# Patient Record
Sex: Male | Born: 1955 | Race: White | Hispanic: No | Smoking: Current some day smoker
Health system: Southern US, Community
[De-identification: ages and names within clinical notes are randomized; demographics above are authoritative.]

## PROBLEM LIST (undated history)

## (undated) DIAGNOSIS — I483 Typical atrial flutter: Secondary | ICD-10-CM

## (undated) DIAGNOSIS — I10 Essential (primary) hypertension: Secondary | ICD-10-CM

## (undated) DIAGNOSIS — E7800 Pure hypercholesterolemia, unspecified: Secondary | ICD-10-CM

## (undated) HISTORY — PX: LAPAROTOMY: SHX154

## (undated) NOTE — *Deleted (*Deleted)
Physician Discharge Summary  Loys Shugars WJX:[id number] DOB: Dec 18, 1955 DOA: 09/19/2020  PCP: Roger Kill, PA-C  Admit date: 09/19/2020 Discharge date: 09/23/2020  Admitted From: *** Disposition: ***  Recommendations for Outpatient Follow-up:  1. Follow up with PCP in 1 week*** 2. Please obtain BMP/CBC in one week*** 3. Please follow up on the following pending results: ***  Home Health: *** Equipment/Devices: ***  Discharge Condition: *** CODE STATUS: *** Diet recommendation: ***   Brief/Interim Summary:  Admission HPI written by Narda Bonds, MD   [facility] course:  ***  Discharge Diagnoses:  Principal Problem:   Atrial flutter with rapid ventricular response (HCC) Active Problems:   Primary hypertension   Typical atrial flutter (HCC)   Acute pulmonary embolism (HCC)   Dyslipidemia   Class 1 obesity   Acute on chronic diastolic CHF (congestive heart failure) (HCC)   Gout   Pleural effusion on left   Unspecified atrial fibrillation (HCC)   Community acquired pneumonia   Dyspnea   Long term current use of antiarrhythmic drug    Discharge Instructions   Allergies as of 09/23/2020   No Known Allergies   Med Rec must be completed prior to using this SMARTLINK***       No Known Allergies  Consultations:  ***   Procedures/Studies: DG Chest 1 View  Result Date: 09/22/2020 CLINICAL DATA:  Left-sided thoracentesis today EXAM: CHEST  1 VIEW COMPARISON:  09/20/2020 FINDINGS: Marked reduction in size of the left pleural effusion. No significant pneumothorax. Mild atelectasis and potentially a small amount of residual effusion at the left lung base. Thoracic spondylosis. Borderline enlargement of the cardiopericardial silhouette. IMPRESSION: 1. Marked reduction in size of the left pleural effusion status post thoracentesis. No pneumothorax. 2. Borderline enlargement of the cardiopericardial silhouette. Electronically Signed    By: Gaylyn Rong M.D.   On: 09/22/2020 14:30   CT Angio Chest PE W and/or Wo Contrast  Result Date: 08/27/2020 CLINICAL DATA:  Shortness of breath and chest pain EXAM: CT ANGIOGRAPHY CHEST WITH CONTRAST TECHNIQUE: Multidetector CT imaging of the chest was performed using the standard protocol during bolus administration of intravenous contrast. Multiplanar CT image reconstructions and MIPs were obtained to evaluate the vascular anatomy. CONTRAST:  88mL OMNIPAQUE IOHEXOL 350 MG/ML SOLN COMPARISON:  None. FINDINGS: Cardiovascular: Satisfactory opacification of the pulmonary arteries to the segmental level. There are filling defects in segmental branches of bilateral upper and right middle lobes. Additional suspected filling defects within subsegmental branches. Left atrial enlargement. No evidence of right heart strain. No pericardial effusion. Mediastinum/Nodes: There are no enlarged lymph nodes identified. Thyroid and esophagus are unremarkable. Lungs/Pleura: Patchy areas of atelectasis. No pleural effusion or pneumothorax. Upper Abdomen: No acute abnormality. Musculoskeletal: No acute osseous abnormality. Review of the MIP images confirms the above findings. IMPRESSION: Acute segmental and subsegmental pulmonary emboli bilaterally. No evidence of right heart strain. Patchy bilateral atelectasis. These results were called by telephone at the time of interpretation on 08/27/2020 at 8:52 pm to provider [facility] , who verbally acknowledged these results. Electronically Signed   By: Guadlupe Spanish M.D.   On: 08/27/2020 20:59   DG Chest Port 1 View  Result Date: 09/20/2020 CLINICAL DATA:  Left pleural effusion status post thoracentesis EXAM: PORTABLE CHEST 1 VIEW COMPARISON:  09/19/2020 FINDINGS: Stable cardiomegaly. Slight interval decrease in left-sided pleural effusion, now moderate. Hazy left basilar opacity. Right lung remains clear. No pneumothorax. IMPRESSION: Interval decrease in left-sided  pleural effusion,  now moderate. No pneumothorax. Electronically Signed   By: Duanne Guess D.O.   On: 09/20/2020 08:29   DG Chest Port 1 View  Result Date: 09/19/2020 CLINICAL DATA:  Sepsis EXAM: PORTABLE CHEST 1 VIEW COMPARISON:  08/27/2020 FINDINGS: Cardiac silhouette appears enlarged, although is partially obscured by a moderate to large left-sided pleural effusion. Associated hazy left basilar opacity. Right lung is clear. No pneumothorax. IMPRESSION: 1. Moderate to large left-sided pleural effusion, new from prior. Associated hazy left basilar opacity may reflect atelectasis or pneumonia. 2. Cardiomegaly. An underlying pericardial effusion would be difficult to exclude. Electronically Signed   By: Duanne Guess D.O.   On: 09/19/2020 12:18   DG Chest Port 1 View  Result Date: 08/27/2020 CLINICAL DATA:  Chest pain. EXAM: PORTABLE CHEST 1 VIEW COMPARISON:  September 23, 2017. FINDINGS: The heart size and mediastinal contours are within normal limits. No pneumothorax or pleural effusion is noted. Hypoinflation of the lungs is noted with mild bibasilar subsegmental atelectasis. The visualized skeletal structures are unremarkable. IMPRESSION: Hypoinflation of the lungs with mild bibasilar subsegmental atelectasis. Electronically Signed   By: Lupita Raider M.D.   On: 08/27/2020 14:42   ECHOCARDIOGRAM COMPLETE  Result Date: 08/28/2020    ECHOCARDIOGRAM REPORT   Patient Name:   MD SMOLA Date of Exam: 08/28/2020 Medical Rec #:  [id number]     Height:       70.0 in Accession #:    [id number]    Weight:       225.0 lb Date of Birth:  1956/01/09     BSA:          2.194 m Patient Age:    64 years      BP:           134/89 mmHg Patient Gender: M             HR:           95 bpm. Exam Location:  Inpatient Procedure: 2D Echo, Cardiac Doppler and Color Doppler Indications:     Pulmonary embolus, Atrial flutter  History:         Patient has no prior history of Echocardiogram examinations.                   Arrythmias:Atrial Flutter, Signs/Symptoms:Pulmonary embolus,                  Shortness of Breath and Chest Pain; Risk Factors:Hypertension                  and Dyslipidemia.  Sonographer:     Lavenia Atlas RDCS Referring Phys:  [id number] Lavone Neri OPYD Diagnosing Phys: Yates Decamp MD IMPRESSIONS  1. Left ventricular ejection fraction, by estimation, is 50 to 55%. The left ventricle has low normal function. The left ventricle has no regional wall motion abnormalities. Left ventricular diastolic parameters were normal.  2. Right ventricular systolic function is normal. The right ventricular size is normal. There is moderately elevated pulmonary artery systolic pressure. The estimated right ventricular systolic pressure is 40.6 mmHg.  3. The mitral valve is normal in structure. Trivial mitral valve regurgitation. No evidence of mitral stenosis.  4. The aortic valve is normal in structure. Aortic valve regurgitation is not visualized.  5. The inferior vena cava is dilated in size with <50% respiratory variability, suggesting right atrial pressure of 15 mmHg. FINDINGS  Left Ventricle: Left ventricular ejection fraction, by estimation, is 50 to 55%. The left ventricle has  low normal function. The left ventricle has no regional wall motion abnormalities. The left ventricular internal cavity size was normal in size. There is no left ventricular hypertrophy. Left ventricular diastolic parameters were normal. Right Ventricle: The right ventricular size is normal. No increase in right ventricular wall thickness. Right ventricular systolic function is normal. There is moderately elevated pulmonary artery systolic pressure. The tricuspid regurgitant velocity is 2.53 m/s, and with an assumed right atrial pressure of 15 mmHg, the estimated right ventricular systolic pressure is 40.6 mmHg. Left Atrium: Left atrial size was normal in size. Right Atrium: Right atrial size was normal in size. Pericardium: There is no evidence of  pericardial effusion. Mitral Valve: The mitral valve is normal in structure. Trivial mitral valve regurgitation. No evidence of mitral valve stenosis. Tricuspid Valve: The tricuspid valve is normal in structure. Tricuspid valve regurgitation is mild . No evidence of tricuspid stenosis. Aortic Valve: The aortic valve is normal in structure. Aortic valve regurgitation is not visualized. Pulmonic Valve: The pulmonic valve was grossly normal. Pulmonic valve regurgitation is not visualized. Aorta: The aortic root is normal in size and structure. Pulmonary Artery: The pulmonary artery is of normal size. Venous: The inferior vena cava is dilated in size with less than 50% respiratory variability, suggesting right atrial pressure of 15 mmHg. IAS/Shunts: No atrial level shunt detected by color flow Doppler.  LEFT VENTRICLE PLAX 2D LVIDd:         6.30 cm  Diastology LVIDs:         4.40 cm  LV e' medial:    6.64 cm/s LV PW:         1.10 cm  LV E/e' medial:  15.2 LV IVS:        1.20 cm  LV e' lateral:   9.90 cm/s LVOT diam:     2.20 cm  LV E/e' lateral: 10.2 LV SV:         68 LV SV Index:   31 LVOT Area:     3.80 cm  RIGHT VENTRICLE RV Basal diam:  2.90 cm RV S prime:     12.80 cm/s TAPSE (M-mode): 3.2 cm RVSP:           40.6 mmHg LEFT ATRIUM             Index       RIGHT ATRIUM            Index LA diam:        4.60 cm 2.10 cm/m  RA Pressure: 15.00 mmHg LA Vol (A2C):   37.8 ml 17.23 ml/m RA Area:     15.10 cm LA Vol (A4C):   41.6 ml 18.96 ml/m RA Volume:   37.20 ml   16.95 ml/m LA Biplane Vol: 40.1 ml 18.27 ml/m  AORTIC VALVE LVOT Vmax:   83.50 cm/s LVOT Vmean:  62.000 cm/s LVOT VTI:    0.179 m  AORTA Ao Root diam: 3.20 cm MITRAL VALVE                TRICUSPID VALVE MV Area (PHT): 7.44 cm     TR Peak grad:   25.6 mmHg MV Decel Time: 102 msec     TR Vmax:        253.00 cm/s MV E velocity: 101.00 cm/s  Estimated RAP:  15.00 mmHg MV A velocity: 51.80 cm/s   RVSP:           40.6 mmHg MV E/A ratio:  1.95  SHUNTS                             Systemic VTI:  0.18 m                             Systemic Diam: 2.20 cm Yates Decamp MD Electronically signed by Yates Decamp MD Signature Date/Time: 08/28/2020/10:19:06 AM    Final    VAS [place] LOWER EXTREMITY VENOUS (DVT)  Result Date: 08/28/2020  Lower Venous DVTStudy Indications: Pulmonary embolism.  Comparison Study: no prior Performing Technologist: Blanch Media RVS  Examination Guidelines: A complete evaluation includes B-mode imaging, spectral Doppler, color Doppler, and power Doppler as needed of all accessible portions of each vessel. Bilateral testing is considered an integral part of a complete examination. Limited examinations for reoccurring indications may be performed as noted. The reflux portion of the exam is performed with the patient in reverse Trendelenburg.  +---------+---------------+---------+-----------+----------+--------------+ RIGHT    CompressibilityPhasicitySpontaneityPropertiesThrombus Aging +---------+---------------+---------+-----------+----------+--------------+ CFV      Full           Yes      Yes                                 +---------+---------------+---------+-----------+----------+--------------+ SFJ      Full                                                        +---------+---------------+---------+-----------+----------+--------------+ FV Prox  Full                                                        +---------+---------------+---------+-----------+----------+--------------+ FV Mid   Full                                                        +---------+---------------+---------+-----------+----------+--------------+ FV DistalFull                                                        +---------+---------------+---------+-----------+----------+--------------+ PFV      Full                                                         +---------+---------------+---------+-----------+----------+--------------+ POP      Full           Yes      Yes                                 +---------+---------------+---------+-----------+----------+--------------+  PTV      Full                                                        +---------+---------------+---------+-----------+----------+--------------+ PERO     Full                                                        +---------+---------------+---------+-----------+----------+--------------+   +---------+---------------+---------+-----------+----------+--------------+ LEFT     CompressibilityPhasicitySpontaneityPropertiesThrombus Aging +---------+---------------+---------+-----------+----------+--------------+ CFV      Full           Yes      Yes                                 +---------+---------------+---------+-----------+----------+--------------+ SFJ      Full                                                        +---------+---------------+---------+-----------+----------+--------------+ FV Prox  Full                                                        +---------+---------------+---------+-----------+----------+--------------+ FV Mid   Full                                                        +---------+---------------+---------+-----------+----------+--------------+ FV DistalFull                                                        +---------+---------------+---------+-----------+----------+--------------+ PFV      Full                                                        +---------+---------------+---------+-----------+----------+--------------+ POP      Full           Yes      Yes                                 +---------+---------------+---------+-----------+----------+--------------+ PTV      Full                                                         +---------+---------------+---------+-----------+----------+--------------+  PERO     Full                                                        +---------+---------------+---------+-----------+----------+--------------+     Summary: BILATERAL: - No evidence of deep vein thrombosis seen in the lower extremities, bilaterally. - No evidence of superficial venous thrombosis in the lower extremities, bilaterally. -   *See table(s) above for measurements and observations. Electronically signed by Gretta Began MD on 08/28/2020 at 4:27:49 PM.    Final    ECHOCARDIOGRAM LIMITED  Addendum Date: 09/21/2020   Read by Dr. Marca Ancona Mclean Electronically Amended 09/21/2020, 1:57 PM   Final [facility])    Result Date: 09/21/2020    ECHOCARDIOGRAM LIMITED REPORT   Patient Name:   HARTWELL VANDIVER Date of Exam: 09/21/2020 Medical Rec #:  [id number]     Height:       70.0 in Accession #:    [id number]    Weight:       232.3 lb Date of Birth:  October 26, 1956     BSA:          2.224 m Patient Age:    64 years      BP:           107/76 mmHg Patient Gender: M             HR:           84 bpm. Exam Location:  Inpatient Procedure: 2D Echo Indications:     atrial flutter. pulmonary embolism.  History:         Patient has prior history of Echocardiogram examinations, most                  recent 08/28/2020. CHF, Arrythmias:Atrial Fibrillation and                  Atrial Flutter; Risk Factors:Dyslipidemia.  Sonographer:     Delcie Roch Referring Phys:  [id number] HYQMV HQION Diagnosing Phys: Orpah Cobb MD IMPRESSIONS  1. Left ventricular ejection fraction, by estimation, is 50%. The left ventricle has mildly decreased function. The left ventricle demonstrates global hypokinesis. Left ventricular diastolic parameters are indeterminate.  2. Right ventricular systolic function is normal. The right ventricular size is normal. The estimated right ventricular systolic pressure is 36.7 mmHg.  3. Left atrial size was mildly dilated.  4.  The mitral valve is normal in structure. Trivial mitral valve regurgitation. No evidence of mitral stenosis.  5. The aortic valve is tricuspid. Aortic valve regurgitation is not visualized. Mild aortic valve sclerosis is present, with no evidence of aortic valve stenosis.  6. The inferior vena cava is dilated in size with <50% respiratory variability, suggesting right atrial pressure of 15 mmHg. FINDINGS  Left Ventricle: Left ventricular ejection fraction, by estimation, is 50%. The left ventricle has mildly decreased function. The left ventricle demonstrates global hypokinesis. The left ventricular internal cavity size was normal in size. There is no left ventricular hypertrophy. Left ventricular diastolic parameters are indeterminate. Right Ventricle: The right ventricular size is normal. No increase in right ventricular wall thickness. Right ventricular systolic function is normal. The tricuspid regurgitant velocity is 2.33 m/s, and with an assumed right atrial pressure of 15 mmHg, the estimated right ventricular systolic pressure is 36.7 mmHg. Left Atrium: Left atrial  size was mildly dilated. Right Atrium: Right atrial size was normal in size. Pericardium: Trivial pericardial effusion is present. Mitral Valve: The mitral valve is normal in structure. Trivial mitral valve regurgitation. No evidence of mitral valve stenosis. Tricuspid Valve: The tricuspid valve is normal in structure. Tricuspid valve regurgitation is trivial. Aortic Valve: The aortic valve is tricuspid. Aortic valve regurgitation is not visualized. Mild aortic valve sclerosis is present, with no evidence of aortic valve stenosis. Aorta: The aortic root is normal in size and structure. Venous: The inferior vena cava is dilated in size with less than 50% respiratory variability, suggesting right atrial pressure of 15 mmHg. IAS/Shunts: No atrial level shunt detected by color flow Doppler. LEFT VENTRICLE PLAX 2D LVIDd:         5.50 cm Diastology  LVIDs:         3.80 cm LV e' medial:  9.46 cm/s LV PW:         1.10 cm LV e' lateral: 10.60 cm/s LV IVS:        0.90 cm  IVC IVC diam: 2.70 cm LEFT ATRIUM         Index LA diam:    4.60 cm 2.07 cm/m  TRICUSPID VALVE TR Peak grad:   21.7 mmHg TR Vmax:        233.00 cm/s Orpah Cobb MD Electronically signed by Marca Ancona MD Signature Date/Time: 09/21/2020/2:08:50 PM  Read by Dr. Marca Ancona Mclean Electronically Amended 09/21/2020, 1:57 PM   Final    IR THORACENTESIS ASP PLEURAL SPACE W/IMG GUIDE  Result Date: 09/20/2020 INDICATION: Patient with history of HF, atrial fibrillation/flutter, dyspnea, and left pleural effusion. Request is made for diagnostic and therapeutic left thoracentesis. EXAM: ULTRASOUND GUIDED DIAGNOSTIC AND THERAPEUTIC LEFT THORACENTESIS MEDICATIONS: 10 mL 1% lidocaine COMPLICATIONS: None immediate. PROCEDURE: An ultrasound guided thoracentesis was thoroughly discussed with the patient and questions answered. The benefits, risks, alternatives and complications were also discussed. The patient understands and wishes to proceed with the procedure. Written consent was obtained. Ultrasound was performed to localize and mark an adequate pocket of fluid in the left chest. The area was then prepped and draped in the normal sterile fashion. 1% Lidocaine was used for local anesthesia. Under ultrasound guidance a 6 Fr Safe-T-Centesis catheter was introduced. Thoracentesis was performed. The catheter was removed and a dressing applied. FINDINGS: A total of approximately 1.5 L of hazy amber fluid was removed. Procedure was stopped after 1.5 L secondary to patient's hypotension (73/63, 92/74) - catheter was removed and patient was laid back in bed with BP improvement (106/87). Patient remained asymptomatic throughout procedure (denied dizziness, vision changes). Samples were sent to the laboratory as requested by the clinical team. IMPRESSION: Successful ultrasound guided left thoracentesis  yielding 1.5 L of pleural fluid. Read by: Elwin Mocha, PA-C Electronically Signed   By: Marliss Coots MD   On: 09/20/2020 08:35     ***   Subjective: ***  Discharge Exam: Vitals:   09/23/20 0101 09/23/20 0403  BP: 104/72 100/63  Pulse: 72 86  Resp: 18 20  Temp: 98.3 F (36.8 C) 99.8 F (37.7 C)  SpO2: 95% 94%   Vitals:   09/22/20 2047 09/22/20 2213 09/23/20 0101 09/23/20 0403  BP: 104/64 113/66 104/72 100/63  Pulse: 78 80 72 86  Resp: 20  18 20   Temp: 98.6 F (37 C)  98.3 F (36.8 C) 99.8 F (37.7 C)  TempSrc: Oral  Oral Oral  SpO2: 92%  95% 94%  Weight:  98.9 kg  Height:        General exam: Appears calm and comfortable Respiratory system: Clear to auscultation on right with slightly diminished breath sounds on left. Respiratory effort normal. Cardiovascular system: S1 & S2 heard, RRR. No murmurs, rubs, gallops or clicks. Gastrointestinal system: Abdomen is protuberant, soft and nontender. No organomegaly or masses felt. Normal bowel sounds heard. Central nervous system: Alert and oriented. No focal neurological deficits. Musculoskeletal: Minimal edema. No calf tenderness Skin: No cyanosis. No rashes Psychiatry: Judgement and insight appear normal. Mood & affect appropriate.     The results of significant diagnostics from this hospitalization (including imaging, microbiology, ancillary and laboratory) are listed below for reference.     Microbiology: Recent Results (from the past 240 hour(s))  Blood culture (routine single)     Status: None (Preliminary result)   Collection Time: 09/19/20 11:33 AM   Specimen: BLOOD  Result Value Ref Range Status   Specimen Description BLOOD BLOOD RIGHT HAND  Final   Special Requests   Final    BOTTLES DRAWN AEROBIC AND ANAEROBIC Blood Culture adequate volume   Culture   Final    NO GROWTH 3 DAYS Performed at [facility], [address], [place], [place] [address]    Report Status PENDING  Incomplete  Urine  culture     Status: Abnormal   Collection Time: 09/19/20 11:33 AM   Specimen: In/Out Cath Urine  Result Value Ref Range Status   Specimen Description IN/OUT CATH URINE  Final   Special Requests   Final    NONE Performed at [facility], [address], [place], [place] [address]    Culture 4,000 COLONIES/mL ESCHERICHIA COLI (A)  Final   Report Status 09/21/2020 FINAL  Final   Organism ID, Bacteria ESCHERICHIA COLI (A)  Final      Susceptibility   Escherichia coli - MIC*    AMPICILLIN 8 SENSITIVE Sensitive     CEFAZOLIN <=4 SENSITIVE Sensitive     CEFEPIME <=0.12 SENSITIVE Sensitive     CEFTRIAXONE <=0.25 SENSITIVE Sensitive     CIPROFLOXACIN <=0.25 SENSITIVE Sensitive     GENTAMICIN <=1 SENSITIVE Sensitive     IMIPENEM <=0.25 SENSITIVE Sensitive     NITROFURANTOIN <=16 SENSITIVE Sensitive     TRIMETH/SULFA <=20 SENSITIVE Sensitive     AMPICILLIN/SULBACTAM <=2 SENSITIVE Sensitive     PIP/TAZO <=4 SENSITIVE Sensitive     * 4,000 COLONIES/mL ESCHERICHIA COLI  Culture, blood (single)     Status: None (Preliminary result)   Collection Time: 09/19/20 12:15 PM   Specimen: BLOOD LEFT FOREARM  Result Value Ref Range Status   Specimen Description BLOOD LEFT FOREARM  Final   Special Requests   Final    BOTTLES DRAWN AEROBIC AND ANAEROBIC Blood Culture adequate volume   Culture   Final    NO GROWTH 3 DAYS Performed at [facility], [address] [place], [place] [address]    Report Status PENDING  Incomplete  Respiratory Panel by RT PCR (Flu A&B, Covid) - Nasopharyngeal Swab     Status: None   Collection Time: 09/19/20  1:11 PM   Specimen: Nasopharyngeal Swab  Result Value Ref Range Status   SARS Coronavirus 2 by RT PCR NEGATIVE NEGATIVE Final    Comment: (NOTE) SARS-CoV-2 target nucleic acids are NOT DETECTED.  The SARS-CoV-2 RNA is generally detectable in upper respiratoy specimens during the acute phase of infection. The lowest concentration of SARS-CoV-2 viral  copies  this assay can detect is 131 copies/mL. A negative result does not preclude SARS-Cov-2 infection and should not be used as the sole basis for treatment or other patient management decisions. A negative result may occur with  improper specimen collection/handling, submission of specimen other than nasopharyngeal swab, presence of viral mutation(s) within the areas targeted by this assay, and inadequate number of viral copies (<131 copies/mL). A negative result must be combined with clinical observations, patient history, and epidemiological information. The expected result is Negative.  Fact Sheet for Patients:  https://www.moore.com/  Fact Sheet for Healthcare Providers:  https://www.young.biz/  This test is no t yet approved or cleared by the [place] FDA and  has been authorized for detection and/or diagnosis of SARS-CoV-2 by FDA under an Emergency Use Authorization (EUA). This EUA will remain  in effect (meaning this test can be used) for the duration of the COVID-19 declaration under Section 564(b)(1) of the Act, 21 U.S.C. section 360bbb-3(b)(1), unless the authorization is terminated or revoked sooner.     Influenza A by PCR NEGATIVE NEGATIVE Final   Influenza B by PCR NEGATIVE NEGATIVE Final    Comment: (NOTE) The Xpert Xpress SARS-CoV-2/FLU/RSV assay is intended as an aid in  the diagnosis of influenza from Nasopharyngeal swab specimens and  should not be used as a sole basis for treatment. Nasal washings and  aspirates are unacceptable for Xpert Xpress SARS-CoV-2/FLU/RSV  testing.  Fact Sheet for Patients: https://www.moore.com/  Fact Sheet for Healthcare Providers: https://www.young.biz/  This test is not yet approved or cleared by the [place] FDA and  has been authorized for detection and/or diagnosis of SARS-CoV-2 by  FDA under an Emergency Use Authorization (EUA). This  EUA will remain  in effect (meaning this test can be used) for the duration of the  Covid-19 declaration under Section 564(b)(1) of the Act, 21  U.S.C. section 360bbb-3(b)(1), unless the authorization is  terminated or revoked. Performed at [facility], [address] [place], [place] [address]   Culture, body fluid-bottle     Status: None (Preliminary result)   Collection Time: 09/20/20  8:35 AM   Specimen: Pleura  Result Value Ref Range Status   Specimen Description PLEURAL FLUID  Final   Special Requests LEFT LUNG  Final   Culture   Final    NO GROWTH 2 DAYS Performed at [facility], [address] [place], [place] [address]    Report Status PENDING  Incomplete  Gram stain     Status: None   Collection Time: 09/20/20  8:35 AM   Specimen: Pleura  Result Value Ref Range Status   Specimen Description PLEURAL FLUID  Final   Special Requests LEFT LUNG  Final   Gram Stain   Final    WBC PRESENT,BOTH PMN AND MONONUCLEAR NO ORGANISMS SEEN CYTOSPIN SMEAR Performed at [facility], [address] [place], [place] [address]    Report Status 09/20/2020 FINAL  Final  Expectorated sputum assessment w rflx to resp cult     Status: None   Collection Time: 09/22/20  3:44 PM   Specimen: Expectorated Sputum  Result Value Ref Range Status   Specimen Description EXPECTORATED SPUTUM  Final   Special Requests NONE  Final   Sputum evaluation   Final    THIS SPECIMEN IS ACCEPTABLE FOR SPUTUM CULTURE Performed at [facility], [address] [place], [place] [address]    Report Status 09/22/2020 FINAL  Final  Culture,  respiratory     Status: None (Preliminary result)   Collection Time: 09/22/20  3:44 PM  Result Value Ref Range Status   Specimen Description EXPECTORATED SPUTUM  Final   Special Requests NONE Reflexed from [id number]  Final   Gram Stain   Final    RARE WBC PRESENT, PREDOMINANTLY PMN RARE GRAM POSITIVE COCCI IN PAIRS RARE GRAM NEGATIVE RODS  Performed at [facility], [address] [place], [place] [address]    Culture PENDING  Incomplete   Report Status PENDING  Incomplete     Labs: BNP (last 3 results) Recent Labs    09/19/20 1135 09/21/20 0441  BNP 149.7* 86.4   Basic Metabolic Panel: Recent Labs  Lab 09/19/20 1133 09/20/20 0500 09/21/20 0441 09/22/20 0420 09/23/20 0138  NA 139 138 138 134* 137  K 4.2 4.0 3.5 3.1* 2.9*  CL 104 101 97* 87* 88*  CO2 25 28 32 36* 37*  GLUCOSE 112* 112* 90 157* 88  BUN 10 13 14 10 10   CREATININE 0.82 0.93 0.88 0.88 0.98  CALCIUM 8.6* 8.8* 8.9 9.1 9.5  MG  --  2.1 2.1 1.9  --    Liver Function Tests: Recent Labs  Lab 09/19/20 1133  AST 15  ALT 27  ALKPHOS 92  BILITOT 0.7  PROT 6.0*  ALBUMIN 2.3*   No results for input(s): LIPASE, AMYLASE in the last 168 hours. No results for input(s): AMMONIA in the last 168 hours. CBC: Recent Labs  Lab 09/19/20 1133 09/20/20 0400 09/23/20 0138  WBC 17.0* 12.6* 10.7*  NEUTROABS 15.0*  --   --   HGB 12.4* 12.3* 12.6*  HCT 39.8 39.5 38.3*  MCV 102.6* 104.8* 96.5  PLT 366 333 369   Cardiac Enzymes: No results for input(s): CKTOTAL, CKMB, CKMBINDEX, TROPONINI in the last 168 hours. BNP: Invalid input(s): POCBNP CBG: No results for input(s): GLUCAP in the last 168 hours. D-Dimer No results for input(s): DDIMER in the last 72 hours. Hgb A1c No results for input(s): HGBA1C in the last 72 hours. Lipid Profile No results for input(s): CHOL, HDL, LDLCALC, TRIG, CHOLHDL, LDLDIRECT in the last 72 hours. Thyroid function studies Recent Labs    09/21/20 0441  TSH 2.577   Anemia work up No results for input(s): VITAMINB12, FOLATE, FERRITIN, TIBC, IRON, RETICCTPCT in the last 72 hours. Urinalysis    Component Value Date/Time   COLORURINE YELLOW 09/19/2020 1133   APPEARANCEUR CLEAR 09/19/2020 1133   LABSPEC 1.017 09/19/2020 1133   PHURINE 5.0 09/19/2020 1133   GLUCOSEU NEGATIVE 09/19/2020 1133   HGBUR SMALL (A)  09/19/2020 1133   BILIRUBINUR NEGATIVE 09/19/2020 1133   KETONESUR NEGATIVE 09/19/2020 1133   PROTEINUR NEGATIVE 09/19/2020 1133   NITRITE NEGATIVE 09/19/2020 1133   LEUKOCYTESUR NEGATIVE 09/19/2020 1133   Sepsis Labs Invalid input(s): PROCALCITONIN,  WBC,  LACTICIDVEN Microbiology Recent Results (from the past 240 hour(s))  Blood culture (routine single)     Status: None (Preliminary result)   Collection Time: 09/19/20 11:33 AM   Specimen: BLOOD  Result Value Ref Range Status   Specimen Description BLOOD BLOOD RIGHT HAND  Final   Special Requests   Final    BOTTLES DRAWN AEROBIC AND ANAEROBIC Blood Culture adequate volume   Culture   Final    NO GROWTH 3 DAYS Performed at [facility], [address], [place], [place] [address]    Report Status PENDING  Incomplete  Urine culture     Status: Abnormal  Collection Time: 09/19/20 11:33 AM   Specimen: In/Out Cath Urine  Result Value Ref Range Status   Specimen Description IN/OUT CATH URINE  Final   Special Requests   Final    NONE Performed at [facility], [address], [place], [place] [address]    Culture 4,000 COLONIES/mL ESCHERICHIA COLI (A)  Final   Report Status 09/21/2020 FINAL  Final   Organism ID, Bacteria ESCHERICHIA COLI (A)  Final      Susceptibility   Escherichia coli - MIC*    AMPICILLIN 8 SENSITIVE Sensitive     CEFAZOLIN <=4 SENSITIVE Sensitive     CEFEPIME <=0.12 SENSITIVE Sensitive     CEFTRIAXONE <=0.25 SENSITIVE Sensitive     CIPROFLOXACIN <=0.25 SENSITIVE Sensitive     GENTAMICIN <=1 SENSITIVE Sensitive     IMIPENEM <=0.25 SENSITIVE Sensitive     NITROFURANTOIN <=16 SENSITIVE Sensitive     TRIMETH/SULFA <=20 SENSITIVE Sensitive     AMPICILLIN/SULBACTAM <=2 SENSITIVE Sensitive     PIP/TAZO <=4 SENSITIVE Sensitive     * 4,000 COLONIES/mL ESCHERICHIA COLI  Culture, blood (single)     Status: None (Preliminary result)   Collection Time: 09/19/20 12:15 PM   Specimen: BLOOD LEFT FOREARM   Result Value Ref Range Status   Specimen Description BLOOD LEFT FOREARM  Final   Special Requests   Final    BOTTLES DRAWN AEROBIC AND ANAEROBIC Blood Culture adequate volume   Culture   Final    NO GROWTH 3 DAYS Performed at [facility], [address] [place], [place] [address]    Report Status PENDING  Incomplete  Respiratory Panel by RT PCR (Flu A&B, Covid) - Nasopharyngeal Swab     Status: None   Collection Time: 09/19/20  1:11 PM   Specimen: Nasopharyngeal Swab  Result Value Ref Range Status   SARS Coronavirus 2 by RT PCR NEGATIVE NEGATIVE Final    Comment: (NOTE) SARS-CoV-2 target nucleic acids are NOT DETECTED.  The SARS-CoV-2 RNA is generally detectable in upper respiratoy specimens during the acute phase of infection. The lowest concentration of SARS-CoV-2 viral copies this assay can detect is 131 copies/mL. A negative result does not preclude SARS-Cov-2 infection and should not be used as the sole basis for treatment or other patient management decisions. A negative result may occur with  improper specimen collection/handling, submission of specimen other than nasopharyngeal swab, presence of viral mutation(s) within the areas targeted by this assay, and inadequate number of viral copies (<131 copies/mL). A negative result must be combined with clinical observations, patient history, and epidemiological information. The expected result is Negative.  Fact Sheet for Patients:  https://www.moore.com/  Fact Sheet for Healthcare Providers:  https://www.young.biz/  This test is no t yet approved or cleared by the [place] FDA and  has been authorized for detection and/or diagnosis of SARS-CoV-2 by FDA under an Emergency Use Authorization (EUA). This EUA will remain  in effect (meaning this test can be used) for the duration of the COVID-19 declaration under Section 564(b)(1) of the Act, 21 U.S.C. section 360bbb-3(b)(1),  unless the authorization is terminated or revoked sooner.     Influenza A by PCR NEGATIVE NEGATIVE Final   Influenza B by PCR NEGATIVE NEGATIVE Final    Comment: (NOTE) The Xpert Xpress SARS-CoV-2/FLU/RSV assay is intended as an aid in  the diagnosis of influenza from Nasopharyngeal swab specimens and  should not be used as a sole basis for treatment. Nasal washings and  aspirates are unacceptable for Xpert Xpress SARS-CoV-2/FLU/RSV  testing.  Fact Sheet for Patients: https://www.moore.com/  Fact Sheet for Healthcare Providers: https://www.young.biz/  This test is not yet approved or cleared by the [place] FDA and  has been authorized for detection and/or diagnosis of SARS-CoV-2 by  FDA under an Emergency Use Authorization (EUA). This EUA will remain  in effect (meaning this test can be used) for the duration of the  Covid-19 declaration under Section 564(b)(1) of the Act, 21  U.S.C. section 360bbb-3(b)(1), unless the authorization is  terminated or revoked. Performed at [facility], [address] [place], [place] [address]   Culture, body fluid-bottle     Status: None (Preliminary result)   Collection Time: 09/20/20  8:35 AM   Specimen: Pleura  Result Value Ref Range Status   Specimen Description PLEURAL FLUID  Final   Special Requests LEFT LUNG  Final   Culture   Final    NO GROWTH 2 DAYS Performed at [facility], [address] [place], [place] [address]    Report Status PENDING  Incomplete  Gram stain     Status: None   Collection Time: 09/20/20  8:35 AM   Specimen: Pleura  Result Value Ref Range Status   Specimen Description PLEURAL FLUID  Final   Special Requests LEFT LUNG  Final   Gram Stain   Final    WBC PRESENT,BOTH PMN AND MONONUCLEAR NO ORGANISMS SEEN CYTOSPIN SMEAR Performed at [facility], [address], [place], [place] [address]    Report Status 09/20/2020 FINAL  Final   Expectorated sputum assessment w rflx to resp cult     Status: None   Collection Time: 09/22/20  3:44 PM   Specimen: Expectorated Sputum  Result Value Ref Range Status   Specimen Description EXPECTORATED SPUTUM  Final   Special Requests NONE  Final   Sputum evaluation   Final    THIS SPECIMEN IS ACCEPTABLE FOR SPUTUM CULTURE Performed at [facility], [address] [place], [place] [address]    Report Status 09/22/2020 FINAL  Final  Culture, respiratory     Status: None (Preliminary result)   Collection Time: 09/22/20  3:44 PM  Result Value Ref Range Status   Specimen Description EXPECTORATED SPUTUM  Final   Special Requests NONE Reflexed from [phone]  Final   Gram Stain   Final    RARE WBC PRESENT, PREDOMINANTLY PMN RARE GRAM POSITIVE COCCI IN PAIRS RARE GRAM NEGATIVE RODS Performed at [facility], [address] [place], [place] [address]    Culture PENDING  Incomplete   Report Status PENDING  Incomplete     Time coordinating discharge: Over 30 minutes***  SIGNED:   Jacquelin Hawking, MD [facility] 09/23/2020, 8:48 AM

## (undated) NOTE — *Deleted (*Deleted)
Subjective:  ***  Intake/Output from previous day:  I/O last 3 completed shifts: In: 1117.7 [I.V.:967.7; IV Piggyback:150] Out: 700 [Urine:700] Total I/O In: 240 [P.O.:240] Out: -   Blood pressure 133/83, pulse 76, temperature 98.6 F (37 C), temperature source Oral, resp. rate 20, height 5\' 10"  (1.778 m), weight 102.1 kg, SpO2 96 %. Physical Exam  Lab Results: BMP BNP (last 3 results) No results for input(s): BNP in the last 8760 hours.  ProBNP (last 3 results) No results for input(s): PROBNP in the last 8760 hours. BMP Latest Ref Rng & Units 08/29/2020 08/28/2020 08/27/2020  Glucose 70 - 99 mg/dL 161(W) 960(A) 540(J)  BUN 8 - 23 mg/dL 13 16 17   Creatinine 0.61 - 1.24 mg/dL 8.11 9.14 7.82  Sodium 135 - 145 mmol/L 136 135 141  Potassium 3.5 - 5.1 mmol/L 3.9 4.2 4.9  Chloride 98 - 111 mmol/L 105 104 108  CO2 22 - 32 mmol/L 22 21(L) 18(L)  Calcium 8.9 - 10.3 mg/dL 9.5(A) 2.1(H) 0.8(M)   No flowsheet data found. CBC Latest Ref Rng & Units 08/29/2020 08/28/2020 08/27/2020  WBC 4.0 - 10.5 K/uL 8.6 12.7(H) 16.7(H)  Hemoglobin 13.0 - 17.0 g/dL 12.0(L) 13.3 14.9  Hematocrit 39 - 52 % 37.1(L) 40.9 45.6  Platelets 150 - 400 K/uL 142(L) 138(L) 162   Lipid Panel  No results found for: CHOL, TRIG, HDL, CHOLHDL, VLDL, LDLCALC, LDLDIRECT Cardiac Panel (last 3 results) No results for input(s): CKTOTAL, CKMB, TROPONINI, RELINDX in the last 72 hours.  HEMOGLOBIN A1C No results found for: HGBA1C, MPG TSH Recent Labs    08/27/20 1430  TSH 0.457   Imaging: {imaging:[id number]}  Cardiac Studies:  EKG: {ekg findings:315101::"normal EKG, normal sinus rhythm","unchanged from previous tracings"}.  Recent Results (from the past 57846 hour(s))  ECHOCARDIOGRAM COMPLETE   Collection Time: 08/28/20  9:59 AM  Result Value   Weight 3,600   Height 70   BP 128/77   S' Lateral 4.40   Area-P 1/2 7.44   Narrative      ECHOCARDIOGRAM REPORT       Patient Name:   ADEN SEK Date of  Exam: 08/28/2020 Medical Rec #:  [id number]     Height:       70.0 in Accession #:    [id number]    Weight:       225.0 lb Date of Birth:  [birth year]     BSA:          2.194 m Patient Age:    64 years      BP:           134/89 mmHg Patient Gender: M             HR:           95 bpm. Exam Location:  Inpatient  Procedure: 2D Echo, Cardiac Doppler and Color Doppler  Indications:     Pulmonary embolus, Atrial flutter   History:         Patient has no prior history of Echocardiogram examinations.                  Arrythmias:Atrial Flutter, Signs/Symptoms:Pulmonary embolus,                  Shortness of Breath and Chest Pain; Risk Factors:Hypertension                  and Dyslipidemia.   Sonographer:     Lavenia Atlas RDCS Referring Phys:  [phone] TIMOTHY  S OPYD Diagnosing Phys: Yates Decamp MD  IMPRESSIONS    1. Left ventricular ejection fraction, by estimation, is 50 to 55%. The left ventricle has low normal function. The left ventricle has no regional wall motion abnormalities. Left ventricular diastolic parameters were normal.  2. Right ventricular systolic function is normal. The right ventricular size is normal. There is moderately elevated pulmonary artery systolic pressure. The estimated right ventricular systolic pressure is 40.6 mmHg.  3. The mitral valve is normal in structure. Trivial mitral valve regurgitation. No evidence of mitral stenosis.  4. The aortic valve is normal in structure. Aortic valve regurgitation is not visualized.  5. The inferior vena cava is dilated in size with <50% respiratory variability, suggesting right atrial pressure of 15 mmHg.  FINDINGS  Left Ventricle: Left ventricular ejection fraction, by estimation, is 50 to 55%. The left ventricle has low normal function. The left ventricle has no regional wall motion abnormalities. The left ventricular internal cavity size was normal in size.  There is no left ventricular hypertrophy. Left ventricular  diastolic parameters were normal.  Right Ventricle: The right ventricular size is normal. No increase in right ventricular wall thickness. Right ventricular systolic function is normal. There is moderately elevated pulmonary artery systolic pressure. The tricuspid regurgitant velocity is  2.53 m/s, and with an assumed right atrial pressure of 15 mmHg, the estimated right ventricular systolic pressure is 40.6 mmHg.  Left Atrium: Left atrial size was normal in size.  Right Atrium: Right atrial size was normal in size.  Pericardium: There is no evidence of pericardial effusion.  Mitral Valve: The mitral valve is normal in structure. Trivial mitral valve regurgitation. No evidence of mitral valve stenosis.  Tricuspid Valve: The tricuspid valve is normal in structure. Tricuspid valve regurgitation is mild . No evidence of tricuspid stenosis.  Aortic Valve: The aortic valve is normal in structure. Aortic valve regurgitation is not visualized.  Pulmonic Valve: The pulmonic valve was grossly normal. Pulmonic valve regurgitation is not visualized.  Aorta: The aortic root is normal in size and structure.  Pulmonary Artery: The pulmonary artery is of normal size.  Venous: The inferior vena cava is dilated in size with less than 50% respiratory variability, suggesting right atrial pressure of 15 mmHg.  IAS/Shunts: No atrial level shunt detected by color flow Doppler.    LEFT VENTRICLE PLAX 2D LVIDd:         6.30 cm  Diastology LVIDs:         4.40 cm  LV e' medial:    6.64 cm/s LV PW:         1.10 cm  LV E/e' medial:  15.2 LV IVS:        1.20 cm  LV e' lateral:   9.90 cm/s LVOT diam:     2.20 cm  LV E/e' lateral: 10.2 LV SV:         68 LV SV Index:   31 LVOT Area:     3.80 cm    RIGHT VENTRICLE RV Basal diam:  2.90 cm RV S prime:     12.80 cm/s TAPSE (M-mode): 3.2 cm RVSP:           40.6 mmHg  LEFT ATRIUM             Index       RIGHT ATRIUM            Index LA diam:        4.60 cm  2.10 cm/m  RA Pressure: 15.00 mmHg LA Vol (A2C):   37.8 ml 17.23 ml/m RA Area:     15.10 cm LA Vol (A4C):   41.6 ml 18.96 ml/m RA Volume:   37.20 ml   16.95 ml/m LA Biplane Vol: 40.1 ml 18.27 ml/m  AORTIC VALVE LVOT Vmax:   83.50 cm/s LVOT Vmean:  62.000 cm/s LVOT VTI:    0.179 m   AORTA Ao Root diam: 3.20 cm  MITRAL VALVE                TRICUSPID VALVE MV Area (PHT): 7.44 cm     TR Peak grad:   25.6 mmHg MV Decel Time: 102 msec     TR Vmax:        253.00 cm/s MV E velocity: 101.00 cm/s  Estimated RAP:  15.00 mmHg MV A velocity: 51.80 cm/s   RVSP:           40.6 mmHg MV E/A ratio:  1.95                             SHUNTS                             Systemic VTI:  0.18 m                             Systemic Diam: 2.20 cm  Yates Decamp MD Electronically signed by Yates Decamp MD Signature Date/Time: 08/28/2020/10:19:06 AM       Final     *Note: Due to a large number of results and/or encounters for the requested time period, some results have not been displayed. A complete set of results can be found in Results Review.    Scheduled Meds: . apixaban  10 mg Oral BID   Followed by  . [START ON 09/04/2020] apixaban  5 mg Oral BID  . atorvastatin  20 mg Oral Daily  . diltiazem  300 mg Oral Daily  . dronedarone  400 mg Oral BID WC  . metoprolol tartrate  25 mg Oral BID  . sodium chloride flush  3 mL Intravenous Q12H   Continuous Infusions: PRN Meds:.acetaminophen **OR** acetaminophen, HYDROcodone-acetaminophen, levalbuterol, morphine injection, ondansetron **OR** ondansetron (ZOFRAN) IV, polyethylene glycol  Assessment/Plan:  ***    Yates Decamp, MD, [facility] 08/29/2020, 1:04 PM Office: [phone] Pager: [phone]

## (undated) NOTE — *Deleted (*Deleted)
ANTICOAGULATION CONSULT NOTE - Initial Consult  Pharmacy Consult for heparin gtt Indication: pulmonary embolus  No Known Allergies  Patient Measurements: Height: 5\' 10"  (177.8 cm) Weight: 102.1 kg (225 lb) IBW/kg (Calculated) : 73 Heparin Dosing Weight: 94.5kg  Vital Signs: Temp: 98.8 F (37.1 C) (10/18 1426) Temp Source: Oral (10/18 1426) BP: 130/92 (10/18 2015) Pulse Rate: 101 (10/18 2015)  Labs: Recent Labs    08/27/20 1430 08/27/20 1740 08/27/20 1810  HGB 14.9  --   --   HCT 45.6  --   --   PLT 162  --   --   CREATININE 0.98  --   --   TROPONINIHS  --  39* 31*    Estimated Creatinine Clearance: 91.1 mL/min (by C-G formula based on SCr of 0.98 mg/dL).   Medical History: Past Medical History:  Diagnosis Date  . Hypercholesteremia   . Primary hypertension      Assessment: 18 year old male who initially presented with rapid a. Flutter and underwent cardioversion 10/18. He then began to experience increasing chest pain and shortness of breath. On CT chest, acute bilateral pulmonary emboli were seen  With no evidence of R heart strain and pharmacy was consulted to dose heparin.   Pt received eliquis 5mg  x1 at 1530 on 10/18   Goal of Therapy:  Heparin level 0.3-0.7 units/ml  APTT 66-102  Monitor platelets by anticoagulation protocol: Yes   Plan:    De Burrs  PGY1 pharmacy resident 08/27/2020,9:02 PM

---

## 2017-09-23 ENCOUNTER — Ambulatory Visit
Admission: RE | Admit: 2017-09-23 | Discharge: 2017-09-23 | Disposition: A | Discharge status: Home / Self Care | Payer: Federal, State, Local not specified - PPO | Source: Ambulatory Visit | Attending: Physician Assistant | Admitting: Physician Assistant

## 2017-09-23 ENCOUNTER — Other Ambulatory Visit: Payer: Self-pay | Admitting: Physician Assistant

## 2017-09-23 DIAGNOSIS — J22 Unspecified acute lower respiratory infection: Secondary | ICD-10-CM

## 2017-09-23 DIAGNOSIS — R0602 Shortness of breath: Secondary | ICD-10-CM

## 2017-09-23 DIAGNOSIS — R05 Cough: Secondary | ICD-10-CM

## 2017-09-23 DIAGNOSIS — R059 Cough, unspecified: Secondary | ICD-10-CM

## 2018-12-22 DIAGNOSIS — Z0000 Encounter for general adult medical examination without abnormal findings: Secondary | ICD-10-CM | POA: Diagnosis not present

## 2018-12-22 DIAGNOSIS — M1A0790 Idiopathic chronic gout, unspecified ankle and foot, without tophus (tophi): Secondary | ICD-10-CM | POA: Diagnosis not present

## 2018-12-22 DIAGNOSIS — Z125 Encounter for screening for malignant neoplasm of prostate: Secondary | ICD-10-CM | POA: Diagnosis not present

## 2018-12-27 DIAGNOSIS — M1A0790 Idiopathic chronic gout, unspecified ankle and foot, without tophus (tophi): Secondary | ICD-10-CM | POA: Diagnosis not present

## 2018-12-27 DIAGNOSIS — Z23 Encounter for immunization: Secondary | ICD-10-CM | POA: Diagnosis not present

## 2018-12-27 DIAGNOSIS — I10 Essential (primary) hypertension: Secondary | ICD-10-CM | POA: Diagnosis not present

## 2018-12-27 DIAGNOSIS — N529 Male erectile dysfunction, unspecified: Secondary | ICD-10-CM | POA: Diagnosis not present

## 2018-12-27 DIAGNOSIS — Z0000 Encounter for general adult medical examination without abnormal findings: Secondary | ICD-10-CM | POA: Diagnosis not present

## 2018-12-27 DIAGNOSIS — E7800 Pure hypercholesterolemia, unspecified: Secondary | ICD-10-CM | POA: Diagnosis not present

## 2019-02-28 DIAGNOSIS — I10 Essential (primary) hypertension: Secondary | ICD-10-CM | POA: Diagnosis not present

## 2020-02-17 DIAGNOSIS — E7800 Pure hypercholesterolemia, unspecified: Secondary | ICD-10-CM | POA: Diagnosis not present

## 2020-02-17 DIAGNOSIS — I10 Essential (primary) hypertension: Secondary | ICD-10-CM | POA: Diagnosis not present

## 2020-02-17 DIAGNOSIS — N528 Other male erectile dysfunction: Secondary | ICD-10-CM | POA: Diagnosis not present

## 2020-02-17 DIAGNOSIS — R5383 Other fatigue: Secondary | ICD-10-CM | POA: Diagnosis not present

## 2020-02-17 DIAGNOSIS — Z125 Encounter for screening for malignant neoplasm of prostate: Secondary | ICD-10-CM | POA: Diagnosis not present

## 2020-02-17 DIAGNOSIS — Z0000 Encounter for general adult medical examination without abnormal findings: Secondary | ICD-10-CM | POA: Diagnosis not present

## 2020-02-17 DIAGNOSIS — M1A0710 Idiopathic chronic gout, right ankle and foot, without tophus (tophi): Secondary | ICD-10-CM | POA: Diagnosis not present

## 2020-08-27 ENCOUNTER — Encounter (HOSPITAL_COMMUNITY): Payer: Self-pay | Admitting: Cardiology

## 2020-08-27 ENCOUNTER — Emergency Department (HOSPITAL_COMMUNITY): Payer: Federal, State, Local not specified - PPO

## 2020-08-27 ENCOUNTER — Inpatient Hospital Stay (HOSPITAL_COMMUNITY)
Admission: EM | Admit: 2020-08-27 | Discharge: 2020-08-30 | DRG: 308 | Disposition: A | Discharge status: Home / Self Care | Payer: Federal, State, Local not specified - PPO | Attending: Internal Medicine | Admitting: Internal Medicine

## 2020-08-27 ENCOUNTER — Other Ambulatory Visit: Payer: Self-pay

## 2020-08-27 DIAGNOSIS — I10 Essential (primary) hypertension: Secondary | ICD-10-CM | POA: Diagnosis not present

## 2020-08-27 DIAGNOSIS — K219 Gastro-esophageal reflux disease without esophagitis: Secondary | ICD-10-CM | POA: Diagnosis present

## 2020-08-27 DIAGNOSIS — I2692 Saddle embolus of pulmonary artery without acute cor pulmonale: Secondary | ICD-10-CM | POA: Diagnosis present

## 2020-08-27 DIAGNOSIS — I499 Cardiac arrhythmia, unspecified: Secondary | ICD-10-CM | POA: Diagnosis not present

## 2020-08-27 DIAGNOSIS — Z20822 Contact with and (suspected) exposure to covid-19: Secondary | ICD-10-CM | POA: Diagnosis present

## 2020-08-27 DIAGNOSIS — Z7982 Long term (current) use of aspirin: Secondary | ICD-10-CM

## 2020-08-27 DIAGNOSIS — R0789 Other chest pain: Secondary | ICD-10-CM | POA: Diagnosis not present

## 2020-08-27 DIAGNOSIS — Z6832 Body mass index (BMI) 32.0-32.9, adult: Secondary | ICD-10-CM

## 2020-08-27 DIAGNOSIS — I2699 Other pulmonary embolism without acute cor pulmonale: Secondary | ICD-10-CM | POA: Diagnosis not present

## 2020-08-27 DIAGNOSIS — E119 Type 2 diabetes mellitus without complications: Secondary | ICD-10-CM | POA: Diagnosis present

## 2020-08-27 DIAGNOSIS — F1290 Cannabis use, unspecified, uncomplicated: Secondary | ICD-10-CM | POA: Diagnosis present

## 2020-08-27 DIAGNOSIS — I483 Typical atrial flutter: Principal | ICD-10-CM | POA: Diagnosis present

## 2020-08-27 DIAGNOSIS — I4891 Unspecified atrial fibrillation: Secondary | ICD-10-CM | POA: Diagnosis not present

## 2020-08-27 DIAGNOSIS — Z8249 Family history of ischemic heart disease and other diseases of the circulatory system: Secondary | ICD-10-CM

## 2020-08-27 DIAGNOSIS — I471 Supraventricular tachycardia: Secondary | ICD-10-CM | POA: Diagnosis not present

## 2020-08-27 DIAGNOSIS — E669 Obesity, unspecified: Secondary | ICD-10-CM | POA: Diagnosis present

## 2020-08-27 DIAGNOSIS — J90 Pleural effusion, not elsewhere classified: Secondary | ICD-10-CM | POA: Diagnosis not present

## 2020-08-27 DIAGNOSIS — F17290 Nicotine dependence, other tobacco product, uncomplicated: Secondary | ICD-10-CM | POA: Diagnosis not present

## 2020-08-27 DIAGNOSIS — I2782 Chronic pulmonary embolism: Secondary | ICD-10-CM | POA: Diagnosis present

## 2020-08-27 DIAGNOSIS — J9811 Atelectasis: Secondary | ICD-10-CM | POA: Diagnosis present

## 2020-08-27 DIAGNOSIS — Z01810 Encounter for preprocedural cardiovascular examination: Secondary | ICD-10-CM | POA: Diagnosis not present

## 2020-08-27 DIAGNOSIS — I4510 Unspecified right bundle-branch block: Secondary | ICD-10-CM | POA: Diagnosis present

## 2020-08-27 DIAGNOSIS — R079 Chest pain, unspecified: Secondary | ICD-10-CM | POA: Diagnosis not present

## 2020-08-27 DIAGNOSIS — I480 Paroxysmal atrial fibrillation: Secondary | ICD-10-CM | POA: Diagnosis not present

## 2020-08-27 DIAGNOSIS — R0602 Shortness of breath: Secondary | ICD-10-CM | POA: Diagnosis not present

## 2020-08-27 DIAGNOSIS — E785 Hyperlipidemia, unspecified: Secondary | ICD-10-CM | POA: Diagnosis present

## 2020-08-27 DIAGNOSIS — Z720 Tobacco use: Secondary | ICD-10-CM | POA: Diagnosis not present

## 2020-08-27 DIAGNOSIS — Z79899 Other long term (current) drug therapy: Secondary | ICD-10-CM

## 2020-08-27 DIAGNOSIS — E7800 Pure hypercholesterolemia, unspecified: Secondary | ICD-10-CM | POA: Diagnosis not present

## 2020-08-27 DIAGNOSIS — F17210 Nicotine dependence, cigarettes, uncomplicated: Secondary | ICD-10-CM | POA: Diagnosis not present

## 2020-08-27 DIAGNOSIS — Z7901 Long term (current) use of anticoagulants: Secondary | ICD-10-CM

## 2020-08-27 DIAGNOSIS — Z86711 Personal history of pulmonary embolism: Secondary | ICD-10-CM | POA: Diagnosis present

## 2020-08-27 HISTORY — DX: Typical atrial flutter: I48.3

## 2020-08-27 HISTORY — DX: Essential (primary) hypertension: I10

## 2020-08-27 HISTORY — DX: Pure hypercholesterolemia, unspecified: E78.00

## 2020-08-27 HISTORY — DX: Other pulmonary embolism without acute cor pulmonale: I26.99

## 2020-08-27 LAB — BASIC METABOLIC PANEL
Anion gap: 15 (ref 5–15)
BUN: 17 mg/dL (ref 8–23)
CO2: 18 mmol/L — ABNORMAL LOW (ref 22–32)
Calcium: 8.8 mg/dL — ABNORMAL LOW (ref 8.9–10.3)
Chloride: 108 mmol/L (ref 98–111)
Creatinine, Ser: 0.98 mg/dL (ref 0.61–1.24)
GFR, Estimated: >60 mL/min (ref >60)
Glucose, Bld: 128 mg/dL — ABNORMAL HIGH (ref 70–99)
Potassium: 4.9 mmol/L (ref 3.5–5.1)
Sodium: 141 mmol/L (ref 135–145)

## 2020-08-27 LAB — CBC WITH DIFFERENTIAL/PLATELET
Abs Immature Granulocytes: 0.1 10*3/uL — ABNORMAL HIGH (ref 0.00–0.07)
Basophils Absolute: 0.1 10*3/uL (ref 0.0–0.1)
Basophils Relative: 0 %
Eosinophils Absolute: 0 10*3/uL (ref 0.0–0.5)
Eosinophils Relative: 0 %
HCT: 45.6 % (ref 39.0–52.0)
Hemoglobin: 14.9 g/dL (ref 13.0–17.0)
Immature Granulocytes: 1 %
Lymphocytes Relative: 3 %
Lymphs Abs: 0.5 10*3/uL — ABNORMAL LOW (ref 0.7–4.0)
MCH: 33.3 pg (ref 26.0–34.0)
MCHC: 32.7 g/dL (ref 30.0–36.0)
MCV: 102 fL — ABNORMAL HIGH (ref 80.0–100.0)
Monocytes Absolute: 1.9 10*3/uL — ABNORMAL HIGH (ref 0.1–1.0)
Monocytes Relative: 12 %
Neutro Abs: 14.1 10*3/uL — ABNORMAL HIGH (ref 1.7–7.7)
Neutrophils Relative %: 84 %
Platelets: 162 10*3/uL (ref 150–400)
RBC: 4.47 MIL/uL (ref 4.22–5.81)
RDW: 12.9 % (ref 11.5–15.5)
WBC: 16.7 10*3/uL — ABNORMAL HIGH (ref 4.0–10.5)
nRBC: 0 % (ref 0.0–0.2)

## 2020-08-27 LAB — RESPIRATORY PANEL BY RT PCR (FLU A&B, COVID)
Influenza A by PCR: NEGATIVE
Influenza B by PCR: NEGATIVE
SARS Coronavirus 2 by RT PCR: NEGATIVE

## 2020-08-27 LAB — TROPONIN I (HIGH SENSITIVITY)
Troponin I (High Sensitivity): 31 ng/L — ABNORMAL HIGH (ref <18)
Troponin I (High Sensitivity): 39 ng/L — ABNORMAL HIGH (ref <18)

## 2020-08-27 LAB — TSH: TSH: 0.457 u[IU]/mL (ref 0.350–4.500)

## 2020-08-27 LAB — MAGNESIUM: Magnesium: 1.6 mg/dL — ABNORMAL LOW (ref 1.7–2.4)

## 2020-08-27 MED ORDER — ONDANSETRON HCL 4 MG PO TABS: 4.0000 mg | ORAL_TABLET | Freq: Four times a day (QID) | ORAL | Status: DC | PRN

## 2020-08-27 MED ORDER — SODIUM CHLORIDE 0.9 % IV SOLN: INTRAVENOUS | Status: AC

## 2020-08-27 MED ORDER — HYDROCODONE-ACETAMINOPHEN 5-325 MG PO TABS
1.0000 | ORAL_TABLET | ORAL | Status: DC | PRN
  Administered 2020-08-28 – 2020-08-30 (×2): 2 via ORAL
  Filled 2020-08-27 (×2): qty 2

## 2020-08-27 MED ORDER — DILTIAZEM HCL ER COATED BEADS 240 MG PO CP24
240.0000 mg | ORAL_CAPSULE | Freq: Every day | ORAL | 0 refills | Status: DC
Start: 2020-08-27 — End: 2020-08-30

## 2020-08-27 MED ORDER — MORPHINE SULFATE (PF) 4 MG/ML IV SOLN
4.0000 mg | INTRAVENOUS | Status: DC | PRN
  Administered 2020-08-28 – 2020-08-30 (×2): 4 mg via INTRAVENOUS
  Filled 2020-08-27 (×2): qty 1

## 2020-08-27 MED ORDER — METOPROLOL TARTRATE 5 MG/5ML IV SOLN
INTRAVENOUS | Status: AC
  Administered 2020-08-27: 5 mg
  Filled 2020-08-27: qty 5

## 2020-08-27 MED ORDER — DILTIAZEM HCL ER COATED BEADS 120 MG PO CP24
120.0000 mg | ORAL_CAPSULE | Freq: Once | ORAL | Status: AC
  Administered 2020-08-27: 120 mg via ORAL
  Filled 2020-08-27 (×2): qty 1

## 2020-08-27 MED ORDER — ACETAMINOPHEN 650 MG RE SUPP: 650.0000 mg | Freq: Four times a day (QID) | RECTAL | Status: DC | PRN

## 2020-08-27 MED ORDER — SODIUM CHLORIDE 0.9% FLUSH
3.0000 mL | Freq: Two times a day (BID) | INTRAVENOUS | Status: DC
  Administered 2020-08-28 – 2020-08-30 (×6): 3 mL via INTRAVENOUS

## 2020-08-27 MED ORDER — ONDANSETRON HCL 4 MG/2ML IJ SOLN: 4.0000 mg | Freq: Four times a day (QID) | INTRAMUSCULAR | Status: DC | PRN

## 2020-08-27 MED ORDER — ATORVASTATIN CALCIUM 10 MG PO TABS
10.0000 mg | ORAL_TABLET | Freq: Every day | ORAL | Status: DC
  Administered 2020-08-28: 10 mg via ORAL
  Filled 2020-08-27: qty 1

## 2020-08-27 MED ORDER — APIXABAN 5 MG PO TABS: 5.0000 mg | ORAL_TABLET | Freq: Two times a day (BID) | ORAL | 0 refills | Status: DC

## 2020-08-27 MED ORDER — IOHEXOL 350 MG/ML SOLN
88.0000 mL | Freq: Once | INTRAVENOUS | Status: AC | PRN
  Administered 2020-08-27: 88 mL via INTRAVENOUS

## 2020-08-27 MED ORDER — MAGNESIUM SULFATE 2 GM/50ML IV SOLN
2.0000 g | Freq: Once | INTRAVENOUS | Status: AC
  Administered 2020-08-28: 2 g via INTRAVENOUS
  Filled 2020-08-27: qty 50

## 2020-08-27 MED ORDER — APIXABAN 5 MG PO TABS
5.0000 mg | ORAL_TABLET | Freq: Once | ORAL | Status: AC
  Administered 2020-08-27: 5 mg via ORAL
  Filled 2020-08-27: qty 1

## 2020-08-27 MED ORDER — MORPHINE SULFATE (PF) 4 MG/ML IV SOLN
4.0000 mg | Freq: Once | INTRAVENOUS | Status: AC
  Administered 2020-08-27: 4 mg via INTRAVENOUS
  Filled 2020-08-27: qty 1

## 2020-08-27 MED ORDER — PROPOFOL 10 MG/ML IV BOLUS
0.5000 mg/kg | Freq: Once | INTRAVENOUS | Status: AC
  Administered 2020-08-27: 51.1 mg via INTRAVENOUS
  Filled 2020-08-27: qty 20

## 2020-08-27 MED ORDER — DILTIAZEM HCL ER COATED BEADS 240 MG PO CP24
240.0000 mg | ORAL_CAPSULE | Freq: Every day | ORAL | Status: DC
  Administered 2020-08-28: 240 mg via ORAL
  Filled 2020-08-27 (×2): qty 1

## 2020-08-27 MED ORDER — POLYETHYLENE GLYCOL 3350 17 G PO PACK: 17.0000 g | PACK | Freq: Every day | ORAL | Status: DC | PRN

## 2020-08-27 MED ORDER — ACETAMINOPHEN 325 MG PO TABS: 650.0000 mg | ORAL_TABLET | Freq: Four times a day (QID) | ORAL | Status: DC | PRN

## 2020-08-27 MED ORDER — MAGNESIUM SULFATE IN D5W 1-5 GM/100ML-% IV SOLN
1.0000 g | Freq: Once | INTRAVENOUS | Status: AC
  Administered 2020-08-27: 1 g via INTRAVENOUS
  Filled 2020-08-27: qty 100

## 2020-08-27 MED ORDER — HEPARIN (PORCINE) 25000 UT/250ML-% IV SOLN
1700.0000 [IU]/h | INTRAVENOUS | Status: DC
  Administered 2020-08-27: 1700 [IU]/h via INTRAVENOUS
  Filled 2020-08-27: qty 250

## 2020-08-27 NOTE — CV Procedure (Addendum)
Direct current cardioversion:  Indication symptomatic A.Flutter with RVR Procedure: Using 75 mg of IV Propofol  for achieving deep sedation, synchronized direct current cardioversion performed. Patient was delivered with 100 Joules of electricity X 1 with success to NSR. Patient tolerated the procedure well. No immediate complication noted.    Dr. Kayleen Memos. Charm Barges, emergency room physician assisted me with sedation and airway protection.  Recommendation: We will cycle cardiac markers, will give diltiazem CD to 40 mg daily along with Eliquis 5 mg p.o. twice daily upon discharge and will follow up in the outpatient basis. In view of hypertension, hyperlipidemia, tobacco use disorder, abnormal EKG, will schedule him for outpatient stress test and also an echocardiogram.   Carl Decamp, MD, [facility] 08/27/2020, 4:08 PM Office: [phone]

## 2020-08-27 NOTE — Progress Notes (Signed)
ANTICOAGULATION CONSULT NOTE - Initial Consult  Pharmacy Consult for heparin  Indication: pulmonary embolus  No Known Allergies  Patient Measurements: Height: 5\' 10"  (177.8 cm) Weight: 102.1 kg (225 lb) IBW/kg (Calculated) : 73 Heparin Dosing Weight: 94.5kg  Vital Signs: Temp: 98.8 F (37.1 C) (10/18 1426) Temp Source: Oral (10/18 1426) BP: 130/92 (10/18 2015) Pulse Rate: 101 (10/18 2015)  Labs: Recent Labs    08/27/20 1430 08/27/20 1740 08/27/20 1810  HGB 14.9  --   --   HCT 45.6  --   --   PLT 162  --   --   CREATININE 0.98  --   --   TROPONINIHS  --  39* 31*    Estimated Creatinine Clearance: 91.1 mL/min (by C-G formula based on SCr of 0.98 mg/dL).   Medical History: Past Medical History:  Diagnosis Date  . Hypercholesteremia   . Primary hypertension      Assessment: 64 year old male who initially presented with rapid a. Flutter and underwent cardioversion 10/18. He then began to experience increasing chest pain and shortness of breath. On CT chest, acute bilateral pulmonary emboli were seen  With no evidence of R heart strain and pharmacy was consulted to dose heparin.   Pt received eliquis 5mg  x1 at 1530 on 10/18 post cardioversion.  Goal of Therapy:  Heparin level 0.3-0.7 units/ml  APTT 66-102  Monitor platelets by anticoagulation protocol: Yes   Plan:  - Patient received Apixaban 5mg  dose at ~ 1530 on 10/18 prior to Dx of PE for afib  - With this only being half of the desired Apixaban dose of 10mg  for the treatment on a PE will start the patient on heparin this evening - No heparin bolus due to Apixaban dose this evening  - Start heparin drip @ 1700 units/hr  - Due to dose of Apixaban will monitor using aptt until heparin level and aptt correlate.  - Monitor patient for s/s of bleeding and cbc while on heparin  11/18 PharmD. BCPS  08/27/2020,9:24 PM

## 2020-08-27 NOTE — ED Notes (Addendum)
Pt c/o chest pain, shortness of breath and clamminess. EKG obtained and given to Dr. Dalene Seltzer.

## 2020-08-27 NOTE — ED Provider Notes (Addendum)
[facility]   CSN: [id number] Arrival date & time: 08/27/20  1418     History Chief Complaint  Patient presents with  . Chest Pain  . Atrial Flutter    Carl [name] is a 64 y.o. male.  He has no significant medical history.  Complaining of 3 out of 10 chest pressure and shortness of breath that started around 1 AM woke him up.  No prior history of same.  Eventually he called EMS who found him in a narrow complex rate: 170.  He received 6 and 12 of adenosine with no change in his rhythm, Cardizem x3 doses without any change.  He had aspirin prior to arrival.  Denies any fever cough nausea vomiting diarrhea.  Says he had a couple of beers yesterday and frequently smokes marijuana.  The history is provided by the patient.  Chest Pain Pain location:  Substernal area Pain quality: pressure   Pain radiates to:  Does not radiate Pain severity:  Mild Onset quality:  Sudden Duration:  14 hours Timing:  Constant Progression:  Unchanged Chronicity:  New Context: at rest   Relieved by:  Nothing Worsened by:  Exertion Ineffective treatments:  Aspirin Associated symptoms: shortness of breath   Associated symptoms: no abdominal pain, no cough, no diaphoresis, no dizziness, no fever, no nausea, no syncope, no vomiting and no weakness   Risk factors: male sex   Risk factors: no coronary artery disease        Past Medical History:  Diagnosis Date  . Hypercholesteremia   . Primary hypertension     Patient Active Problem List   Diagnosis Date Noted  . Primary hypertension 08/27/2020  . Typical atrial flutter (HCC) 08/27/2020    Past Surgical History:  Procedure Laterality Date  . LAPAROTOMY         Family History  Problem Relation Age of Onset  . CAD Mother 7056    Social History   Tobacco Use  . Smoking status: Current Every Day Smoker    Types: Cigars  . Smokeless tobacco: Never Used  . Tobacco comment: Once a  week  Vaping Use  . Vaping Use: Never assessed  Substance Use Topics  . Alcohol use: Yes    Alcohol/week: 1.0 standard drink    Types: 1 Glasses of wine per week    Comment: Occasional  . Drug use: Never    Home Medications Prior to Admission medications   Medication Sig Start Date End Date Taking? Authorizing Provider  apixaban (ELIQUIS) 5 MG TABS tablet Take 1 tablet (5 mg total) by mouth 2 (two) times daily. 08/27/20 09/26/20  Terrilee FilesButler, Sinan Tuch C, MD  diltiazem (DILTIAZEM CD) 240 MG 24 hr capsule Take 1 capsule (240 mg total) by mouth daily. 08/27/20   Terrilee FilesButler, Cambell Rickenbach C, MD    Allergies    Patient has no known allergies.  Review of Systems   Review of Systems  Constitutional: Negative for diaphoresis and fever.  HENT: Negative for sore throat.   Eyes: Negative for visual disturbance.  Respiratory: Positive for shortness of breath. Negative for cough.   Cardiovascular: Positive for chest pain. Negative for syncope.  Gastrointestinal: Negative for abdominal pain, nausea and vomiting.  Genitourinary: Negative for dysuria.  Musculoskeletal: Negative for neck pain.  Skin: Negative for rash.  Neurological: Negative for dizziness and weakness.    Physical Exam Updated Vital Signs BP 126/84   Pulse (!) 173   Temp 98.8 F (37.1 C) (  Oral)   Resp (!) 38   Ht 5\' 10"  (1.778 m)   Wt 102.1 kg   SpO2 96%   BMI 32.28 kg/m   Physical Exam Vitals and nursing note reviewed.  Constitutional:      Appearance: He is well-developed.  HENT:     Head: Normocephalic and atraumatic.  Eyes:     Conjunctiva/sclera: Conjunctivae normal.  Cardiovascular:     Rate and Rhythm: Regular rhythm. Tachycardia present.     Heart sounds: No murmur heard.   Pulmonary:     Effort: Pulmonary effort is normal. No respiratory distress.     Breath sounds: Normal breath sounds.  Abdominal:     Palpations: Abdomen is soft. There is no mass.     Tenderness: There is no abdominal tenderness.    Musculoskeletal:        General: Normal range of motion.     Cervical back: Neck supple.     Right lower leg: No tenderness. No edema.     Left lower leg: No tenderness. No edema.  Skin:    General: Skin is warm and dry.     Capillary Refill: Capillary refill takes less than 2 seconds.  Neurological:     General: No focal deficit present.     Mental Status: He is alert.     ED Results / Procedures / Treatments   Labs (all labs ordered are listed, but only abnormal results are displayed) Labs Reviewed  BASIC METABOLIC PANEL - Abnormal; Notable for the following components:      Result Value   CO2 18 (*)    Glucose, Bld 128 (*)    Calcium 8.8 (*)    All other components within normal limits  CBC WITH DIFFERENTIAL/PLATELET - Abnormal; Notable for the following components:   WBC 16.7 (*)    MCV 102.0 (*)    Neutro Abs 14.1 (*)    Lymphs Abs 0.5 (*)    Monocytes Absolute 1.9 (*)    Abs Immature Granulocytes 0.10 (*)    All other components within normal limits  MAGNESIUM - Abnormal; Notable for the following components:   Magnesium 1.6 (*)    All other components within normal limits  TSH  TROPONIN I (HIGH SENSITIVITY)  TROPONIN I (HIGH SENSITIVITY)    EKG EKG Interpretation  Date/Time:  Monday August 27 2020 14:19:17 EDT Ventricular Rate:  173 PR Interval:    QRS Duration: 127 QT Interval:  316 QTC Calculation: 537 R Axis:   -41 Text Interpretation: Wide-QRS tachycardia RBBB and LAFB No old tracing to compare Confirmed by 10-06-1998 [phone]) on 08/27/2020 2:21:46 PM   Radiology DG Chest Port 1 View  Result Date: 08/27/2020 CLINICAL DATA:  Chest pain. EXAM: PORTABLE CHEST 1 VIEW COMPARISON:  September 23, 2017. FINDINGS: The heart size and mediastinal contours are within normal limits. No pneumothorax or pleural effusion is noted. Hypoinflation of the lungs is noted with mild bibasilar subsegmental atelectasis. The visualized skeletal structures are  unremarkable. IMPRESSION: Hypoinflation of the lungs with mild bibasilar subsegmental atelectasis. Electronically Signed   By: September 25, 2017 M.D.   On: 08/27/2020 14:42    Procedures .Sedation  Date/Time: 08/27/2020 3:23 PM Performed by: 08/29/2020, MD Authorized by: Terrilee Files, MD   Consent:    Consent obtained:  Verbal and written   Consent given by:  Patient   Risks discussed:  Allergic reaction, dysrhythmia, inadequate sedation, nausea, prolonged hypoxia resulting in organ damage, prolonged sedation  necessitating reversal, respiratory compromise necessitating ventilatory assistance and intubation and vomiting   Alternatives discussed:  Analgesia without sedation, anxiolysis and regional anesthesia Universal protocol:    Procedure explained and questions answered to patient or proxy's satisfaction: yes     Relevant documents present and verified: yes     Test results available and properly labeled: yes     Imaging studies available: yes     Required blood products, implants, devices, and special equipment available: yes     Site/side marked: yes     Immediately prior to procedure a time out was called: yes     Patient identity confirmation method:  Verbally with patient Indications:    Procedure performed:  Cardioversion   Procedure necessitating sedation performed by:  Physician performing sedation Pre-sedation assessment:    Time since last food or drink:  12   ASA classification: class 1 - normal, healthy patient     Neck mobility: normal     Mouth opening:  3 or more finger widths   Thyromental distance:  4 finger widths   Mallampati score:  II - soft palate, uvula, fauces visible   Pre-sedation assessments completed and reviewed: airway patency, cardiovascular function, hydration status, mental status, nausea/vomiting, pain level, respiratory function and temperature     Pre-sedation assessment completed:  08/27/2020 2:50 PM Immediate pre-procedure details:     Reassessment: Patient reassessed immediately prior to procedure     Reviewed: vital signs, relevant labs/tests and NPO status     Verified: bag valve mask available, emergency equipment available, intubation equipment available, IV patency confirmed, oxygen available and suction available   Procedure details (see MAR for exact dosages):    Preoxygenation:  Nasal cannula   Sedation:  Propofol   Intended level of sedation: deep   Intra-procedure monitoring:  Blood pressure monitoring, cardiac monitor, continuous pulse oximetry, frequent LOC assessments, frequent vital sign checks and continuous capnometry   Intra-procedure events: none     Total Provider sedation time (minutes):  10 Post-procedure details:    Post-sedation assessment completed:  08/27/2020 4:00 PM   Attendance: Constant attendance by certified staff until patient recovered     Recovery: Patient returned to pre-procedure baseline     Post-sedation assessments completed and reviewed: airway patency, cardiovascular function, hydration status, mental status, nausea/vomiting, pain level, respiratory function and temperature     Patient is stable for discharge or admission: yes     Patient tolerance:  Tolerated well, no immediate complications   (including critical care time)  Medications Ordered in ED Medications  magnesium sulfate IVPB 1 g 100 mL (1 g Intravenous New Bag/Given 08/27/20 1758)  propofol (DIPRIVAN) 10 mg/mL bolus/IV push 51.1 mg (51.1 mg Intravenous Given 08/27/20 1519)  metoprolol tartrate (LOPRESSOR) 5 MG/5ML injection (5 mg  Given 08/27/20 1516)  diltiazem (CARDIZEM CD) 24 hr capsule 120 mg (120 mg Oral Given 08/27/20 1545)  apixaban (ELIQUIS) tablet 5 mg (5 mg Oral Given 08/27/20 1544)    ED Course  I have reviewed the triage vital signs and the nursing notes.  Pertinent labs & imaging results that were available during my care of the patient were reviewed by me and considered in my medical decision  making (see chart for details).  Clinical Course as of Aug 28 937  Mon Aug 27, 2020  1444 Placed a call into cardiology and Rosann Auerbach said she would have somebody call me back regarding his EKG.  Tried vagal maneuver twice without any change in his rate  or rhythm.   [MB]  1446 Patient Dr. Jacinto Halim cardiology   [MB]  1524 Dr. Jacinto Halim evaluated the patient and wants to proceed with cardioversion.  Patient was consented and underwent deep sedation with propofol.  He was cardioverted with 100 J with return to normal sinus rhythm.  Patient tolerated procedure well.   [MB]    Clinical Course User Index [MB] Terrilee Files, MD   MDM Rules/Calculators/A&P                         This patient complains of chest pressure and tachycardia; this involves an extensive number of treatment Options and is a complaint that carries with it a high risk of complications and Morbidity. The differential includes A. fib, SVT, other arrhythmia, anemia, ACS, pneumonia, PE, metabolic derangement  I ordered, reviewed and interpreted labs, which included CBC with elevated white count possibly reactive, normal hemoglobin, chemistries with low bicarb elevated glucose, magnesium low, TSH normal I ordered medication oral diltiazem and apixaban, IV propofol for sedation I ordered imaging studies which included chest x-ray and I independently    visualized and interpreted imaging which showed no acute infiltrates Additional history obtained from EMS Previous records obtained and reviewed in epic, no recent admissions I consulted Dr. Jacinto Halim cardiology and discussed lab and imaging findings  Critical Interventions: Management of patient's rapid A. fib with sedation and cardioversion  After the interventions stated above, I reevaluated the patient and found patient to be symptomatically improved.  He is returned to normal sinus rhythm.  Signed out to oncoming provider to follow-up on his recovery from sedation.  Plan is likely  discharge on calcium channel blocker and anticoagulation with follow-up with Dr. Jacinto Halim as the outpatient provider.  Patient comfortable with plan.  CHA2DS2/VAS Stroke Risk Points  Current as of 44 minutes ago     1 >= 2 Points: High Risk  1 - 1.99 Points: Medium Risk  0 Points: Low Risk    No Change      Details    This score determines the patient's risk of having a stroke if the  patient has atrial fibrillation.       Points Metrics  0 Has Congestive Heart Failure:  No    Current as of 44 minutes ago  0 Has Vascular Disease:  No    Current as of 44 minutes ago  1 Has Hypertension:  Yes    Current as of 44 minutes ago  0 Age:  79    Current as of 44 minutes ago  0 Has Diabetes:  No    Current as of 44 minutes ago  0 Had Stroke:  No  Had TIA:  No  Had thromboembolism:  No    Current as of 44 minutes ago  0 Male:  No    Current as of 44 minutes ago      Patient signed out to oncoming provider Dr. Dalene Seltzer to reassess that he is fully cleared his sedation medications and is back to baseline.    Final Clinical Impression(s) / ED Diagnoses Final diagnoses:  Atrial fibrillation with RVR (HCC)    Rx / DC Orders ED Discharge Orders         Ordered    Amb referral to [facility]        08/27/20 1456    apixaban (ELIQUIS) 5 MG TABS tablet  2 times daily        08/27/20 1525  diltiazem (DILTIAZEM CD) 240 MG 24 hr capsule  Daily        08/27/20 1525           Terrilee Files, MD 08/27/20 1808    Terrilee Files, MD 08/28/20 [phone]

## 2020-08-27 NOTE — ED Notes (Signed)
Pt c/o increasing chest pain 4/10, increased shortness of breath and HR=100s. MD notified.

## 2020-08-27 NOTE — ED Triage Notes (Signed)
Pt brought to ED via EMS from home with c/o centralized chest pressure, non-radiating that began around 1AM. EMS reports pt was in aflutter with a vent rate in the 170s. Pt given adenosine 6 mg & 12 mg, cardizem 10 mg x 2 and 15 mg x 1  with no change. Pt states he took 324 mg aspirin PTA. Pt currently A&O x4, CP rated 3/10, aflutter with HR of 173. EDP at bedside.  EMS v/s: 134/86 176 HR 100% on NRB

## 2020-08-27 NOTE — Consult Note (Addendum)
CARDIOLOGY CONSULT NOTE  Patient ID: Carl [name] MRN: [id number] DOB/AGE: [birth year] 64 y.o.  Admit date: 08/27/2020 Referring Physician  Meridee Score, MD Primary Physician:  Bernita Buffy Reason for Consultation  New onset A. Flutter  Patient ID: Carl [name], male    DOB: Sep 03, 1956, 64 y.o.   MRN: [id number]  Chief Complaint  Patient presents with  . Chest Pain  . Atrial Flutter   HPI:    Carl [name]  is a 64 y.o. with past medical history of hypertension, hyperlipidemia work-up this morning with chest tightness, palpitations around 1 AM, due to persistent symptoms called the EMS and he was found to have narrow complex SVT for which he received intravenous adenosine with no success to change to sinus rhythm.  He was brought to the emergency room, I was asked to evaluate the patient.  Patient was found to be in a flutter with RVR, typical atrial flutter.  Past Medical History:  Diagnosis Date  . Hypercholesteremia   . Primary hypertension    Family History  Problem Relation Age of Onset  . CAD Mother 71    Social History   Tobacco Use  . Smoking status: Current Every Day Smoker    Types: Cigars  . Smokeless tobacco: Never Used  . Tobacco comment: Once a week  Substance Use Topics  . Alcohol use: Yes    Alcohol/week: 1.0 standard drink    Types: 1 Glasses of wine per week    Comment: Occasional    Marital Sttus: Widowed  ROS  Review of Systems  Cardiovascular: Positive for chest pain, dyspnea on exertion and palpitations. Negative for leg swelling.  Gastrointestinal: Negative for melena.  All other systems reviewed and are negative.  Objective   Vitals with BMI 08/27/2020 08/27/2020 08/27/2020  Height - - -  Weight - - -  BMI - - -  Systolic 100 - 88  Diastolic 69 - 76  Pulse - 86 85    Blood pressure 100/69, pulse 86, temperature 98.8 F (37.1 C), temperature source Oral, resp. rate (!) 23, height 5\' 10"  (1.778 m), weight 102.1 kg, SpO2  96 %.    Physical Exam Constitutional:      Appearance: He is obese.  HENT:     Mouth/Throat:     Mouth: Mucous membranes are moist.     Pharynx: Oropharynx is clear.  Cardiovascular:     Rate and Rhythm: Regular rhythm. Tachycardia present.     Pulses: Intact distal pulses.     Heart sounds: Normal heart sounds. No murmur heard.  No gallop.      Comments: No leg edema, no JVD. Pulmonary:     Effort: Pulmonary effort is normal.     Breath sounds: Normal breath sounds.  Abdominal:     General: Bowel sounds are normal.     Palpations: Abdomen is soft.  Musculoskeletal:        General: Normal range of motion.  Skin:    General: Skin is warm and dry.  Neurological:     General: No focal deficit present.     Mental Status: He is alert and oriented to person, place, and time. Mental status is at baseline.    Laboratory examination:   Recent Labs    08/27/20 1430  NA 141  K 4.9  CL 108  CO2 18*  GLUCOSE 128*  BUN 17  CREATININE 0.98  CALCIUM 8.8*  GFRNONAA >60   estimated creatinine clearance is 91.1 mL/min (by  C-G formula based on SCr of 0.98 mg/dL).  CMP Latest Ref Rng & Units 08/27/2020  Glucose 70 - 99 mg/dL 381(O)  BUN 8 - 23 mg/dL 17  Creatinine 1.75 - 1.02 mg/dL 5.85  Sodium 277 - 824 mmol/L 141  Potassium 3.5 - 5.1 mmol/L 4.9  Chloride 98 - 111 mmol/L 108  CO2 22 - 32 mmol/L 18(L)  Calcium 8.9 - 10.3 mg/dL 2.3(N)   CBC Latest Ref Rng & Units 08/27/2020  WBC 4.0 - 10.5 K/uL 16.7(H)  Hemoglobin 13.0 - 17.0 g/dL 36.1  Hematocrit 39 - 52 % 45.6  Platelets 150 - 400 K/uL 162   Lipid Panel No results for input(s): CHOL, TRIG, LDLCALC, VLDL, HDL, CHOLHDL, LDLDIRECT in the last 8760 hours.  HEMOGLOBIN A1C No results found for: HGBA1C, MPG TSH Recent Labs    08/27/20 1430  TSH 0.457   BNP (last 3 results) No results for input(s): BNP in the last 8760 hours.  Medications and allergies  Not on File   No outpatient medications have been marked as  taking for the 08/27/20 encounter [facility] Encounter).   Scheduled Meds:  Continuous Infusions: . magnesium sulfate bolus IVPB     PRN Meds:.   No intake/output data recorded. No intake/output data recorded.    Radiology:   [facility] Chest Port 1 View  Result Date: 08/27/2020 CLINICAL DATA:  Chest pain. EXAM: PORTABLE CHEST 1 VIEW COMPARISON:  September 23, 2017. FINDINGS: The heart size and mediastinal contours are within normal limits. No pneumothorax or pleural effusion is noted. Hypoinflation of the lungs is noted with mild bibasilar subsegmental atelectasis. The visualized skeletal structures are unremarkable. IMPRESSION: Hypoinflation of the lungs with mild bibasilar subsegmental atelectasis. Electronically Signed   By: Lupita Raider M.D.   On: 08/27/2020 14:42    Cardiac Studies:   None  EKG:  EKG 08/27/2020 at 1536 hrs.: Normal sinus rhythm at the rate of 85 bpm, left atrial enlargement, left axis deviation, left intrafascicular block.  Incomplete right bundle branch block.  T wave abnormality, cannot exclude inferior ischemia and lateral ischemia.  Abnormal EKG.  EKG 08/27/2020 at 1419 hrs.: Typical atrial flutter with 2: 1 conduction, ventricular rate 173 bpm.  Left axis deviation, left anterior fascicular block.  Incomplete right bundle branch block.  Nonspecific T abnormality.   Assessment   1.  Typical atrial flutter with 2: 1 conduction with RVR CHA2DS2-VASc Score is 1.  Yearly risk of stroke: 1.3% (HTN).  Score of 1=0.6; 2=2.2; 3=3.2; 4=4.8; 5=7.2; 6=9.8; 7=>9.8) -(CHF; HTN; vasc disease DM,  Male = 1; Age <65 =0; 65-74 = 1,  >75 =2; stroke/embolism= 2).    2.  Primary hypertension, controlled 3.  Hyperlipidemia, presently not on therapy 4.  Abnormal EKG  There are no discontinued medications.  Recommendations:   Although patient had chest pain in the morning during palpitation, he was completely asymptomatic.  I do not suspect ACS.  He is presently in a  flutter with RVR heart rate 173 bpm.  In spite of this he is not having any acute chest discomfort, clinical evidence of heart failure.  Suspect primary atrial flutter.  Schedule for Direct current cardioversion. I have discussed regarding risks benefits rate control vs rhythm control with the patient. Patient understands cardiac arrest and need for CPR, aspiration pneumonia, but not limited to these. Patient is willing.  I will address his hyperlipidemia as we tag along.  I will also consider outpatient stress test if he converts to  sinus rhythm can be discharged home if cardiac markers are negative.  I also reviewed his records from PCP and also labs.  Will address lipids on his outpatient encounter.   Yates Decamp, MD, [facility] 08/27/2020, 3:59 PM Office: [phone]

## 2020-08-27 NOTE — H&P (Signed)
History and Physical    Carl [name] AXE:[id number] DOB: [birth year] DOA: 08/27/2020  PCP: Roger Kill, PA-C   Patient coming from: Home   Chief Complaint: Chest pressure, SOB   HPI: Carl [name] is a 64 y.o. male with medical history significant for hyperlipidemia and hypertension, now presenting to the emergency department for evaluation of chest pressure and shortness of breath.  The patient reports that he was in his usual state of health until he woke at approximately 1:30 or 2 AM on 08/27/2020 with chest pressure and shortness of breath.  He took a full dose aspirin and Motrin at home, was able to get back to sleep for a while, but continued to have chest pressure and dyspnea when he woke again few hours later.  Symptoms continued to increase in intensity, prompted him to call EMS.  He denies any fevers, chills, productive cough, recent prolonged immobilization, recent surgery, personal or family history of DVT or PE, recent injury, or cancer.  He denies any leg swelling or tenderness.  He denies any history of easy bleeding or bruising.  EMS found the patient to be in a tachyarrhythmia with rate 170, administered adenosine x2, diltiazem x3, and did not observe any significant change in his heart rate,.  ED Course: Upon arrival to the ED, patient is found to be afebrile, saturating mid 90s on room air, tachycardic in the 170s, and with systolic blood pressure in the upper 80s.  EKG features a tachyarrhythmia with rate 173, RBBB, and LAFB.  Chest x-ray with mild atelectasis and hypoinflation.  Chemistry panel notable for magnesium of 1.6.  CBC with leukocytosis and macrocytosis.  High-sensitivity troponin elevated to 39 and then 31.  Cardiology was consulted by the ED physician, the patient was electrically cardioverted successfully in the emergency department, started on Eliquis and diltiazem, but had recurrent/ongoing chest pressure and dyspnea despite sustaining a sinus rhythm, and  underwent CTA chest which reveals bilateral PE without evidence for heart strain.  Patient was started on IV heparin and also treated with morphine.  Covid screening test not yet collected.  Review of Systems:  All other systems reviewed and apart from HPI, are negative.  Past Medical History:  Diagnosis Date  . Hypercholesteremia   . Primary hypertension     Past Surgical History:  Procedure Laterality Date  . LAPAROTOMY      Social History:   reports that he has been smoking cigars. He has never used smokeless tobacco. He reports current alcohol use of about 1.0 standard drink of alcohol per week. He reports that he does not use drugs.  No Known Allergies  Family History  Problem Relation Age of Onset  . CAD Mother 60     Prior to Admission medications   Medication Sig Start Date End Date Taking? Authorizing Provider  albuterol (VENTOLIN HFA) 108 (90 Base) MCG/ACT inhaler Inhale 1-2 puffs into the lungs every 6 (six) hours as needed for wheezing or shortness of breath.   Yes [provider]  allopurinol (ZYLOPRIM) 100 MG tablet Take 100 mg by mouth in the morning.  02/20/20  Yes [provider]  ascorbic acid (VITAMIN C) 500 MG tablet Take 500 mg by mouth 3 (three) times a week.   Yes [provider]  aspirin 325 MG EC tablet Take 325 mg by mouth as needed (for chest pain).   Yes [provider]  atorvastatin (LIPITOR) 10 MG tablet Take 10 mg by mouth daily.   Yes [provider]  Cyanocobalamin ([employer] B12 GUMMIES PO) Take 1-2 tablets by mouth See admin instructions. Chew 1-2 gummies by mouth in the morning   Yes [provider]  ibuprofen (ADVIL) 200 MG tablet Take 600 mg by mouth every 6 (six) hours as needed (for pain).   Yes [provider]  Lactobacillus Rhamnosus, GG, (RA PROBIOTIC DIGESTIVE CARE) CAPS Take 1 capsule by mouth in the morning.   Yes [provider]  tadalafil (CIALIS) 20 MG tablet Take 20  mg by mouth daily as needed for erectile dysfunction.  02/17/20  Yes [provider]  apixaban (ELIQUIS) 5 MG TABS tablet Take 1 tablet (5 mg total) by mouth 2 (two) times daily. 08/27/20 09/26/20  Terrilee Files, MD  diltiazem (DILTIAZEM CD) 240 MG 24 hr capsule Take 1 capsule (240 mg total) by mouth daily. 08/27/20   Terrilee Files, MD    Physical Exam: Vitals:   08/27/20 2100 08/27/20 2115 08/27/20 2130 08/27/20 2145  BP:  (!) 149/96 (!) 150/91 (!) 162/101  Pulse: (!) 108 (!) 102 (!) 103 (!) 104  Resp: (!) 46 (!) 30 (!) 22 (!) 25  Temp:      TempSrc:      SpO2: 91% 98% 96% 97%  Weight:      Height:        Constitutional: NAD, calm  Eyes: PERTLA, lids and conjunctivae normal ENMT: Mucous membranes are moist. Posterior pharynx clear of any exudate or lesions.   Neck: normal, supple, no masses, no thyromegaly Respiratory: Mild tachypnea, no wheezing, no crackles. No pallor or cyanosis.  Cardiovascular: S1 & S2 heard, regular rate and rhythm. No extremity edema.  Abdomen: No distension, no tenderness, soft. Bowel sounds active.  Musculoskeletal: no clubbing / cyanosis. No joint deformity upper and lower extremities.   Skin: no significant rashes, lesions, ulcers. Warm, dry, well-perfused. Neurologic: No gross facial asymmetry. Sensation intact. Moving all extremitities.  Psychiatric: Alert and oriented to person, place, and situation. Pleasant and cooperative.    Labs and Imaging on Admission: I have personally reviewed following labs and imaging studies  CBC: Recent Labs  Lab 08/27/20 1430  WBC 16.7*  NEUTROABS 14.1*  HGB 14.9  HCT 45.6  MCV 102.0*  PLT 162   Basic Metabolic Panel: Recent Labs  Lab 08/27/20 1430  NA 141  K 4.9  CL 108  CO2 18*  GLUCOSE 128*  BUN 17  CREATININE 0.98  CALCIUM 8.8*  MG 1.6*   GFR: Estimated Creatinine Clearance: 91.1 mL/min (by C-G formula based on SCr of 0.98 mg/dL). Liver Function Tests: No results for  input(s): AST, ALT, ALKPHOS, BILITOT, PROT, ALBUMIN in the last 168 hours. No results for input(s): LIPASE, AMYLASE in the last 168 hours. No results for input(s): AMMONIA in the last 168 hours. Coagulation Profile: No results for input(s): INR, PROTIME in the last 168 hours. Cardiac Enzymes: No results for input(s): CKTOTAL, CKMB, CKMBINDEX, TROPONINI in the last 168 hours. BNP (last 3 results) No results for input(s): PROBNP in the last 8760 hours. HbA1C: No results for input(s): HGBA1C in the last 72 hours. CBG: No results for input(s): GLUCAP in the last 168 hours. Lipid Profile: No results for input(s): CHOL, HDL, LDLCALC, TRIG, CHOLHDL, LDLDIRECT in the last 72 hours. Thyroid Function Tests: Recent Labs    08/27/20 1430  TSH 0.457   Anemia Panel: No results for input(s): VITAMINB12, FOLATE, FERRITIN, TIBC, IRON, RETICCTPCT in the last 72 hours. Urine analysis: No results  found for: COLORURINE, APPEARANCEUR, LABSPEC, PHURINE, GLUCOSEU, HGBUR, BILIRUBINUR, KETONESUR, PROTEINUR, UROBILINOGEN, NITRITE, LEUKOCYTESUR Sepsis Labs: @LABRCNTIP (procalcitonin:4,lacticidven:4) )No results found for this or any previous visit (from the past 240 hour(s)).   Radiological Exams on Admission: CT Angio Chest PE W and/or Wo Contrast  Result Date: 08/27/2020 CLINICAL DATA:  Shortness of breath and chest pain EXAM: CT ANGIOGRAPHY CHEST WITH CONTRAST TECHNIQUE: Multidetector CT imaging of the chest was performed using the standard protocol during bolus administration of intravenous contrast. Multiplanar CT image reconstructions and MIPs were obtained to evaluate the vascular anatomy. CONTRAST:  16mL OMNIPAQUE IOHEXOL 350 MG/ML SOLN COMPARISON:  None. FINDINGS: Cardiovascular: Satisfactory opacification of the pulmonary arteries to the segmental level. There are filling defects in segmental branches of bilateral upper and right middle lobes. Additional suspected filling defects within subsegmental  branches. Left atrial enlargement. No evidence of right heart strain. No pericardial effusion. Mediastinum/Nodes: There are no enlarged lymph nodes identified. Thyroid and esophagus are unremarkable. Lungs/Pleura: Patchy areas of atelectasis. No pleural effusion or pneumothorax. Upper Abdomen: No acute abnormality. Musculoskeletal: No acute osseous abnormality. Review of the MIP images confirms the above findings. IMPRESSION: Acute segmental and subsegmental pulmonary emboli bilaterally. No evidence of right heart strain. Patchy bilateral atelectasis. These results were called by telephone at the time of interpretation on 08/27/2020 at 8:52 pm to provider [facility] , who verbally acknowledged these results. Electronically Signed   By: [facility] M.D.   On: 08/27/2020 20:59   DG Chest Port 1 View  Result Date: 08/27/2020 CLINICAL DATA:  Chest pain. EXAM: PORTABLE CHEST 1 VIEW COMPARISON:  September 23, 2017. FINDINGS: The heart size and mediastinal contours are within normal limits. No pneumothorax or pleural effusion is noted. Hypoinflation of the lungs is noted with mild bibasilar subsegmental atelectasis. The visualized skeletal structures are unremarkable. IMPRESSION: Hypoinflation of the lungs with mild bibasilar subsegmental atelectasis. Electronically Signed   By: September 25, 2017 M.D.   On: 08/27/2020 14:42    EKG: Independently reviewed. Tachyarrhythmia, rate 173, RBBB, LAFB.   Assessment/Plan   1. Acute bilateral PE  - Presents with acute-onset of chest pressure and SOB, was found to be in new atrial flutter with rate 170, continued to have chest pressure and SOB after successful cardioversion, and underwent CTA chest which reveals bilateral PE without heart strain   - Cardiac enzymes slightly elevated and decreasing  - Patient denies personal or family hx of VTE, recent surgery or immobilization, hormone use, injury, or cancer  - He was started on IV heparin in ED - Continue  anticoagulation, check echocardiogram and LE venous 08/29/2020    2. Rapid atrial flutter  - Presents with chest pressure and SOB and was found to be in new atrial flutter with rate 170  - Likely triggered by acute PE  - He is now in SR after electrical cardioversion  - Cardiology recommended anticoagulation, diltiazem, and outpatient follow-up    3. Hypomagnesemia   - Replaced, repeat chemistries in am    DVT prophylaxis: IV heparin  Code Status: Full  Family Communication: Discussed with patient  Disposition Plan:  Patient is from: Home  Anticipated d/c is to: Home  Anticipated d/c date is: 10/19 or 08/29/20 Patient currently: On IV heparin, pending echo and LE vascular 08/31/20  Consults called: Cardiology consulted in ED  Admission status: Observation     [place], MD [facility]  08/27/2020, 10:12 PM

## 2020-08-27 NOTE — ED Provider Notes (Signed)
  Physical Exam  BP 100/69   Pulse 86   Temp 98.8 F (37.1 C) (Oral)   Resp (!) 23   Ht 5\' 10"  (1.778 m)   Wt 102.1 kg   SpO2 96%   BMI 32.28 kg/m   Physical Exam Vitals and nursing note reviewed.  Constitutional:      General: He is not in acute distress.    Appearance: Normal appearance. He is ill-appearing and diaphoretic. He is not toxic-appearing.  HENT:     Head: Normocephalic.  Eyes:     Conjunctiva/sclera: Conjunctivae normal.  Cardiovascular:     Rate and Rhythm: Regular rhythm. Tachycardia present.     Pulses: Normal pulses.  Pulmonary:     Effort: Pulmonary effort is normal. Tachypnea present. No respiratory distress.  Musculoskeletal:        General: No deformity or signs of injury.     Cervical back: No rigidity.  Skin:    General: Skin is warm.     Coloration: Skin is not jaundiced or pale.  Neurological:     General: No focal deficit present.     Mental Status: He is alert and oriented to person, place, and time.     ED Course/Procedures   Clinical Course as of Aug 27 1634  Mon Aug 27, 2020  1444 Placed a call into cardiology and Aug 29, 2020 said she would have somebody call me back regarding his EKG.  Tried vagal maneuver twice without any change in his rate or rhythm.   [MB]  1446 Patient Dr. Rosann Auerbach cardiology   [MB]  1524 Dr. Jacinto Halim evaluated the patient and wants to proceed with cardioversion.  Patient was consented and underwent deep sedation with propofol.  He was cardioverted with 100 J with return to normal sinus rhythm.  Patient tolerated procedure well.   [MB]    Clinical Course User Index [MB] Jayme Cloud, MD    .Critical Care Performed by: Terrilee Files, MD Authorized by: Alvira Monday, MD   Critical care provider statement:    Critical care time (minutes):  45   Critical care was time spent personally by me on the following activities:  Discussions with consultants, evaluation of patient's response to treatment, examination  of patient, ordering and performing treatments and interventions, ordering and review of laboratory studies, ordering and review of radiographic studies, pulse oximetry, re-evaluation of patient's condition, obtaining history from patient or surrogate and review of old charts    MDM  Received care of patient from Dr. Alvira Monday.  Please see his note for prior history, physical and care.  Briefly this is a 64 year old male who presented with chest pain and shortness of breath and was found to be in new onset atrial flutter.  He was cardioverted with Dr. 77 at bedside.  After assumption of care, patient developed chest pain and shortness of breath while he was still in normal sinus rhythm.  EKG has diffuse ST abnormalities. Troponins evaluated and elevated however stable.  Pt with continued dyspnea, tachypnea and chest pain on reevaluation.  CT PE study was obtained which shows bilateral pulmonary emboli without CT evidence of right heart strain.  Given his tachypnea, EKG changes, continued discomfort will admit for further evaluation and anticoagulation. He received 5 mg of Eliquis earlier--will initiate heparin drip without bolus after discussion with pharmacy.  Admitted for further care.      Jacinto Halim, MD 08/28/20 [phone]

## 2020-08-28 ENCOUNTER — Observation Stay (HOSPITAL_BASED_OUTPATIENT_CLINIC_OR_DEPARTMENT_OTHER): Payer: Federal, State, Local not specified - PPO

## 2020-08-28 ENCOUNTER — Observation Stay (HOSPITAL_COMMUNITY): Payer: Federal, State, Local not specified - PPO

## 2020-08-28 ENCOUNTER — Encounter (HOSPITAL_COMMUNITY): Payer: Self-pay | Admitting: Family Medicine

## 2020-08-28 DIAGNOSIS — I10 Essential (primary) hypertension: Secondary | ICD-10-CM | POA: Diagnosis not present

## 2020-08-28 DIAGNOSIS — J9811 Atelectasis: Secondary | ICD-10-CM | POA: Diagnosis not present

## 2020-08-28 DIAGNOSIS — I2692 Saddle embolus of pulmonary artery without acute cor pulmonale: Secondary | ICD-10-CM | POA: Diagnosis not present

## 2020-08-28 DIAGNOSIS — I4891 Unspecified atrial fibrillation: Secondary | ICD-10-CM

## 2020-08-28 DIAGNOSIS — E669 Obesity, unspecified: Secondary | ICD-10-CM

## 2020-08-28 DIAGNOSIS — Z20822 Contact with and (suspected) exposure to covid-19: Secondary | ICD-10-CM | POA: Diagnosis not present

## 2020-08-28 DIAGNOSIS — Z720 Tobacco use: Secondary | ICD-10-CM

## 2020-08-28 DIAGNOSIS — I2782 Chronic pulmonary embolism: Secondary | ICD-10-CM | POA: Diagnosis present

## 2020-08-28 DIAGNOSIS — I2699 Other pulmonary embolism without acute cor pulmonale: Secondary | ICD-10-CM | POA: Diagnosis not present

## 2020-08-28 DIAGNOSIS — I483 Typical atrial flutter: Secondary | ICD-10-CM | POA: Diagnosis not present

## 2020-08-28 LAB — BASIC METABOLIC PANEL
Anion gap: 10 (ref 5–15)
BUN: 16 mg/dL (ref 8–23)
CO2: 21 mmol/L — ABNORMAL LOW (ref 22–32)
Calcium: 8.5 mg/dL — ABNORMAL LOW (ref 8.9–10.3)
Chloride: 104 mmol/L (ref 98–111)
Creatinine, Ser: 0.91 mg/dL (ref 0.61–1.24)
GFR, Estimated: >60 mL/min (ref >60)
Glucose, Bld: 114 mg/dL — ABNORMAL HIGH (ref 70–99)
Potassium: 4.2 mmol/L (ref 3.5–5.1)
Sodium: 135 mmol/L (ref 135–145)

## 2020-08-28 LAB — ANTITHROMBIN III: AntiThromb III Func: 63 % — ABNORMAL LOW (ref 75–120)

## 2020-08-28 LAB — MAGNESIUM: Magnesium: 2.6 mg/dL — ABNORMAL HIGH (ref 1.7–2.4)

## 2020-08-28 LAB — CBC
HCT: 40.9 % (ref 39.0–52.0)
Hemoglobin: 13.3 g/dL (ref 13.0–17.0)
MCH: 33.3 pg (ref 26.0–34.0)
MCHC: 32.5 g/dL (ref 30.0–36.0)
MCV: 102.5 fL — ABNORMAL HIGH (ref 80.0–100.0)
Platelets: 138 10*3/uL — ABNORMAL LOW (ref 150–400)
RBC: 3.99 MIL/uL — ABNORMAL LOW (ref 4.22–5.81)
RDW: 13.2 % (ref 11.5–15.5)
WBC: 12.7 10*3/uL — ABNORMAL HIGH (ref 4.0–10.5)
nRBC: 0 % (ref 0.0–0.2)

## 2020-08-28 LAB — ECHOCARDIOGRAM COMPLETE
Area-P 1/2: 7.44 cm2
Height: 70 in
S' Lateral: 4.4 cm
Weight: 3600 oz

## 2020-08-28 LAB — HEPARIN LEVEL (UNFRACTIONATED): Heparin Unfractionated: 1.5 IU/mL — ABNORMAL HIGH (ref 0.30–0.70)

## 2020-08-28 LAB — HIV ANTIBODY (ROUTINE TESTING W REFLEX): HIV Screen 4th Generation wRfx: NONREACTIVE

## 2020-08-28 LAB — APTT: aPTT: 140 seconds — ABNORMAL HIGH (ref 24–36)

## 2020-08-28 MED ORDER — ATORVASTATIN CALCIUM 10 MG PO TABS
20.0000 mg | ORAL_TABLET | Freq: Every day | ORAL | Status: DC
  Administered 2020-08-29 – 2020-08-30 (×2): 20 mg via ORAL
  Filled 2020-08-28 (×2): qty 2

## 2020-08-28 MED ORDER — DILTIAZEM HCL-DEXTROSE 125-5 MG/125ML-% IV SOLN (PREMIX)
5.0000 mg/h | INTRAVENOUS | Status: DC
  Administered 2020-08-28: 5 mg/h via INTRAVENOUS
  Administered 2020-08-29: 15 mg/h via INTRAVENOUS
  Filled 2020-08-28 (×2): qty 125

## 2020-08-28 MED ORDER — APIXABAN (ELIQUIS) EDUCATION KIT FOR DVT/PE PATIENTS
PACK | Freq: Once | Status: AC
  Filled 2020-08-28 (×2): qty 1

## 2020-08-28 MED ORDER — HEPARIN (PORCINE) 25000 UT/250ML-% IV SOLN
1400.0000 [IU]/h | INTRAVENOUS | Status: DC
  Administered 2020-08-28: 1400 [IU]/h via INTRAVENOUS
  Filled 2020-08-28: qty 250

## 2020-08-28 MED ORDER — DILTIAZEM LOAD VIA INFUSION
5.0000 mg | Freq: Once | INTRAVENOUS | Status: AC
  Administered 2020-08-28: 5 mg via INTRAVENOUS
  Filled 2020-08-28: qty 5

## 2020-08-28 MED ORDER — APIXABAN 5 MG PO TABS
10.0000 mg | ORAL_TABLET | Freq: Two times a day (BID) | ORAL | Status: DC
  Administered 2020-08-28 – 2020-08-30 (×5): 10 mg via ORAL
  Filled 2020-08-28 (×5): qty 2

## 2020-08-28 MED ORDER — APIXABAN 5 MG PO TABS: 5.0000 mg | ORAL_TABLET | Freq: Two times a day (BID) | ORAL | Status: DC

## 2020-08-28 MED ORDER — HEPARIN (PORCINE) 25000 UT/250ML-% IV SOLN: 1400.0000 [IU]/h | INTRAVENOUS | Status: DC

## 2020-08-28 NOTE — Discharge Instructions (Signed)
You were seen in the emergency department for rapid heart rate.  You were in atrial fibrillation.  You were cardioverted and your rhythm is now back in normal sinus rhythm.  Dr. Jacinto Halim wants you to call his office for follow-up in 1 week.  Start diltiazem CD 240 once daily.  Start Eliquis twice a day.  Return to the emergency department if any worsening or concerning symptoms.  Information on my medicine - ELIQUIS (apixaban)  This medication education was reviewed with me or my healthcare representative as part of my discharge preparation.  Why was Eliquis prescribed for you? Eliquis was prescribed to treat blood clots that may have been found in the veins of your legs (deep vein thrombosis) or in your lungs (pulmonary embolism) and to reduce the risk of them occurring again.  What do You need to know about Eliquis ? The starting dose is 10 mg (two 5 mg tablets) taken TWICE daily for the FIRST SEVEN (7) DAYS, then   the dose is reduced to ONE 5 mg tablet taken TWICE daily.  Eliquis may be taken with or without food.   Try to take the dose about the same time in the morning and in the evening. If you have difficulty swallowing the tablet whole please discuss with your pharmacist how to take the medication safely.  Take Eliquis exactly as prescribed and DO NOT stop taking Eliquis without talking to the doctor who prescribed the medication.  Stopping may increase your risk of developing a new blood clot.  Refill your prescription before you run out.  After discharge, you should have regular check-up appointments with your healthcare provider that is prescribing your Eliquis.    What do you do if you miss a dose? If a dose of ELIQUIS is not taken at the scheduled time, take it as soon as possible on the same day and twice-daily administration should be resumed. The dose should not be doubled to make up for a missed dose.  Important Safety Information A possible side effect of Eliquis is  bleeding. You should call your healthcare provider right away if you experience any of the following: ? Bleeding from an injury or your nose that does not stop. ? Unusual colored urine (red or dark brown) or unusual colored stools (red or black). ? Unusual bruising for unknown reasons. ? A serious fall or if you hit your head (even if there is no bleeding).  Some medicines may interact with Eliquis and might increase your risk of bleeding or clotting while on Eliquis. To help avoid this, consult your healthcare provider or pharmacist prior to using any new prescription or non-prescription medications, including herbals, vitamins, non-steroidal anti-inflammatory drugs (NSAIDs) and supplements.  This website has more information on Eliquis (apixaban): http://www.eliquis.com/eliquis/home

## 2020-08-28 NOTE — ED Notes (Signed)
Ordered hospital bed for pt comfort °

## 2020-08-28 NOTE — Progress Notes (Signed)
  Echocardiogram 2D Echocardiogram has been performed.  Pieter Partridge 08/28/2020, 9:59 AM

## 2020-08-28 NOTE — Progress Notes (Signed)
ANTICOAGULATION CONSULT NOTE   Pharmacy Consult for heparin  Indication: pulmonary embolus  Assessment: 64 year old male who initially presented with rapid a. Flutter and underwent cardioversion 10/18. He then began to experience increasing chest pain and shortness of breath. On CT chest, acute bilateral pulmonary emboli were seen  With no evidence of R heart strain and pharmacy was consulted to dose heparin.   Pt received eliquis 5mg  x1 at 1530 on 10/18 post cardioversion. APTT this am 140 sec, heparin level falsely elevated due to eliquis  Goal of Therapy:  Heparin level 0.3-0.7 units/ml  APTT 66-102  Monitor platelets by anticoagulation protocol: Yes   Plan:  Hold heparin for 1 hours Restart at 1400 units/hr Check aPTT 6-8 hours after restart  Maya Scholer, 11/18 PharmD 08/28/2020,5:57 AM

## 2020-08-28 NOTE — Progress Notes (Signed)
Subjective:  Still complains of sharp chest pain when she takes a deep breath in the middle of the chest.  No dyspnea, otherwise no other complaints.  Intake/Output from previous day:  I/O last 3 completed shifts: In: 150 [IV Piggyback:150] Out: 700 [Urine:700] No intake/output data recorded.  Blood pressure 134/89, pulse 95, temperature 98.8 F (37.1 C), temperature source Oral, resp. rate (!) 24, height 5\' 10"  (1.778 m), weight 102.1 kg, SpO2 91 %.   Physical Exam Constitutional:      Appearance: He is obese.  Eyes:     Extraocular Movements: Extraocular movements intact.  Cardiovascular:     Rate and Rhythm: Normal rate and regular rhythm.     Pulses: Normal pulses and intact distal pulses.     Heart sounds: Normal heart sounds. No murmur heard.  No gallop.      Comments: 2+ bilateral pitting leg edema, no JVD. Pulmonary:     Effort: Pulmonary effort is normal.     Breath sounds: Normal breath sounds.  Abdominal:     General: Bowel sounds are normal.     Palpations: Abdomen is soft.  Musculoskeletal:        General: Normal range of motion.     Cervical back: Normal range of motion and neck supple.  Neurological:     General: No focal deficit present.     Mental Status: He is alert and oriented to person, place, and time.     Lab Results: BMP BNP (last 3 results) No results for input(s): BNP in the last 8760 hours.  ProBNP (last 3 results) No results for input(s): PROBNP in the last 8760 hours. BMP Latest Ref Rng & Units 08/28/2020 08/27/2020  Glucose 70 - 99 mg/dL 08/29/2020) 505(W)  BUN 8 - 23 mg/dL 16 17  Creatinine 979(Y - 1.24 mg/dL 8.01 6.55  Sodium 3.74 - 145 mmol/L 135 141  Potassium 3.5 - 5.1 mmol/L 4.2 4.9  Chloride 98 - 111 mmol/L 104 108  CO2 22 - 32 mmol/L 21(L) 18(L)  Calcium 8.9 - 10.3 mg/dL 827) 0.7(E)   No flowsheet data found. CBC Latest Ref Rng & Units 08/28/2020 08/27/2020  WBC 4.0 - 10.5 K/uL 12.7(H) 16.7(H)  Hemoglobin 13.0 - 17.0 g/dL  08/29/2020 44.9  Hematocrit 39 - 52 % 40.9 45.6  Platelets 150 - 400 K/uL 138(L) 162     Ref Range & Units 1 d ago  (08/27/20) 1 d ago  (08/27/20)  Troponin I (High Sensitivity) <18 ng/L 31High  39High CM        HEMOGLOBIN A1C No results found for: HGBA1C, MPG TSH Recent Labs    08/27/20 1430  TSH 0.457   Imaging:  Lower Extremity Venous Duplex  08/28/20   +---------+---------------+---------+-----------+----------+--------------+     Summary: BILATERAL: - No evidence of deep vein thrombosis seen in the lower extremities, bilaterally. - No evidence of superficial venous thrombosis in the lower extremities, bilaterally. -   *See table(s) above for measurements and observations.    Preliminary    CT Angio Chest PE W and/or Wo Contrast.  Date: 08/27/2020 CLINICAL DATA:  Shortness of breath and chest pain  FINDINGS: Cardiovascular: Satisfactory opacification of the pulmonary arteries to the segmental level. There are filling defects in segmental branches of bilateral upper and right middle lobes. Additional suspected filling defects within subsegmental branches. Left atrial enlargement. No evidence of right heart strain. No pericardial effusion. Mediastinum/Nodes: There are no enlarged lymph nodes identified. Thyroid and esophagus are unremarkable. Lungs/Pleura: Patchy  areas of atelectasis. No pleural effusion or pneumothorax. Upper Abdomen: No acute abnormality. Musculoskeletal: No acute osseous abnormality. Review of the MIP images confirms the above findings.  IMPRESSION: Acute segmental and subsegmental pulmonary emboli bilaterally. No evidence of right heart strain. Patchy bilateral atelectasis.  Cardiac Studies:  EKG 08/28/2020: Sinus tachycardia at rate of 100 bpm, left axis deviation, left intrafascicular block.  Early R wave transition in V2, probably lead placement but cannot exclude RVH.  Nonspecific T abnormality.  Compared to EKG done 2020, diffuse inferior and lateral T wave  inversions suggestive of ischemia are no longer present.  No change in V2 R wave progression.  Abnormal EKG.  Echocardiogram 08/28/2020:  1. Left ventricular ejection fraction, by estimation, is 50 to 55%. The left ventricle has low normal function. The left ventricle has no regional wall motion abnormalities. Left ventricular diastolic parameters were normal.  2. Right ventricular systolic function is normal. The right ventricular size is normal. There is moderately elevated pulmonary artery systolic pressure. The estimated right ventricular systolic pressure is 40.6 mmHg.  3. The mitral valve is normal in structure. Trivial mitral valve regurgitation. No evidence of mitral stenosis.  4. The aortic valve is normal in structure. Aortic valve regurgitation is not visualized.  5. The inferior vena cava is dilated in size with <50% respiratory variability, suggesting right atrial pressure of 15 mmHg.  Scheduled Meds: . atorvastatin  10 mg Oral Daily  . diltiazem  240 mg Oral Daily  . sodium chloride flush  3 mL Intravenous Q12H   Continuous Infusions: . heparin 1,400 Units/hr (08/28/20 0636)   PRN Meds:.acetaminophen **OR** acetaminophen, HYDROcodone-acetaminophen, morphine injection, ondansetron **OR** ondansetron (ZOFRAN) IV, polyethylene glycol  Assessment/Plan:  1.  Typical atrial flutter with RVR SP successful cardioversion yesterday on 08/27/2020. 2.  Acute bilateral pulmonary embolus with negative DVT scan, spontaneous PE.  Needs hypercoagulable work-up. 3.  Hypertension 4.  Hyperlipidemia 5.  Tobacco use disorder, smokes cigars 1-2 times a week  Recommendation: Complete abstinence from tobacco.  Chest pain symptoms clearly appear to be musculoskeletal and pleuritic in nature.  EKG changes of significant T abnormality suggestive of ischemia have essentially resolved and suspect it was related to rate related T abnormality.  Echocardiogram does not reveal any wall motion abnormality.  Type II MI due to Acute PE and RV strain and also AFL with RVR.   Increase atorvastatin to 20 mg, add ACE inhibitor for hypertension, continue diltiazem CD.  Smoking cessation discussed with the patient.  He could potentially be discharged home today however he has no relatives at home.  Transition him from heparin to Eliquis 10 mg twice daily for acute PE and transition him to routine maintenance dose at 1 week.   Yates Decamp, MD, [facility] 08/28/2020, 11:52 AM Office: [phone] Pager: [phone]

## 2020-08-28 NOTE — Progress Notes (Signed)
Pt arriving from ED with HR 150-170s.. BP 137/88. EKG performed. Pt denies chest pain, SOB, palpitations or other symptoms. No PRNs ordered. A. Swayze DO paged.

## 2020-08-28 NOTE — Progress Notes (Signed)
ANTICOAGULATION CONSULT NOTE - Initial Consult  Pharmacy Consult for heparin>>apixaban Indication: atrial fibrillation and pulmonary embolus  No Known Allergies  Patient Measurements: Height: 5\' 10"  (177.8 cm) Weight: 102.1 kg (225 lb) IBW/kg (Calculated) : 73  Vital Signs: BP: 147/92 (10/19 1015) Pulse Rate: 100 (10/19 1300)  Labs: Recent Labs    08/27/20 1430 08/27/20 1740 08/27/20 1810 08/28/20 0450  HGB 14.9  --   --  13.3  HCT 45.6  --   --  40.9  PLT 162  --   --  138*  APTT  --   --   --  140*  HEPARINUNFRC  --   --   --  1.50*  CREATININE 0.98  --   --  0.91  TROPONINIHS  --  39* 31*  --     Estimated Creatinine Clearance: 98.1 mL/min (by C-G formula based on SCr of 0.91 mg/dL).   Medical History: Past Medical History:  Diagnosis Date  . Hypercholesteremia   . Primary hypertension   . Pulmonary embolism (HCC) 08/27/2020  . Typical atrial flutter (HCC) 08/27/2020 with PE   Assessment: 64 year old male who initially presented with rapid a. Flutter and underwent cardioversion 10/18. He then began to experience increasing chest pain and shortness of breath. On CT chest, acute bilateral pulmonary emboli were seen. Pharmacy was consulted to dose heparin. He initially received apixaban 5mg  x 1 yesterday before transitioning to IV heparin. Now transitioning back to apixaban.   Goal of Therapy:  Therapeutic anticoagulation, stroke prevention Monitor platelets by anticoagulation protocol: Yes   Plan:  DC heparin and start apixaban 10mg  PO BID x 7 days then 5mg  PO BID thereafter F/u CBC, S&S of bleeding  Smokey Melott, 11/18 08/28/2020,1:27 PM

## 2020-08-28 NOTE — Progress Notes (Signed)
Lower extremity venous has been completed.   Preliminary results in CV Proc.   Blanch Media 08/28/2020 10:03 AM

## 2020-08-28 NOTE — Progress Notes (Signed)
PROGRESS NOTE  Carl [name] UYQ:[id number] DOB: [birth year] DOA: 08/27/2020 PCP: Roger Kill, PA-C  Brief History   Carl [name] is a 64 y.o. male with medical history significant for hyperlipidemia and hypertension, now presenting to the emergency department for evaluation of chest pressure and shortness of breath.  The patient reports that he was in his usual state of health until he woke at approximately 1:30 or 2 AM on 08/27/2020 with chest pressure and shortness of breath.  He took a full dose aspirin and Motrin at home, was able to get back to sleep for a while, but continued to have chest pressure and dyspnea when he woke again few hours later.  Symptoms continued to increase in intensity, prompted him to call EMS.  He denies any fevers, chills, productive cough, recent prolonged immobilization, recent surgery, personal or family history of DVT or PE, recent injury, or cancer.  He denies any leg swelling or tenderness.  He denies any history of easy bleeding or bruising.  EMS found the patient to be in a tachyarrhythmia with rate 170, administered adenosine x2, diltiazem x3, and did not observe any significant change in his heart rate,.  ED Course: Upon arrival to the ED, patient is found to be afebrile, saturating mid 90s on room air, tachycardic in the 170s, and with systolic blood pressure in the upper 80s.  EKG features a tachyarrhythmia with rate 173, RBBB, and LAFB.  Chest x-ray with mild atelectasis and hypoinflation.  Chemistry panel notable for magnesium of 1.6.  CBC with leukocytosis and macrocytosis.  High-sensitivity troponin elevated to 39 and then 31.  Cardiology was consulted by the ED physician, the patient was electrically cardioverted successfully in the emergency department, started on Eliquis and diltiazem, but had recurrent/ongoing chest pressure and dyspnea despite sustaining a sinus rhythm, and underwent CTA chest which reveals bilateral PE without evidence for heart  strain.  B/L Lower Extremity Dopplers were obtained which failed to demonstrate DVT in the lower extremities. Patient was started on IV heparin and also treated with morphine.  The patient has tested negative for COVID.  The patient has been re-evaluated by cardiology. They recommend that the patient be converted to Eliquis from Heparin, that he be maintained on ACE Inhibitor, Diltiazem CD, and Atorvastatin 20 mg daily. He has been advised to stop smoking. The patient will be monitored overnight due to the changes in medications that have been started.   Consultants  . Cardiology  Procedures  . Cardioversion  Antibiotics   Anti-infectives (From admission, onward)   None    .  Subjective  The patient states that he feels better, but not quite normal yet. He states that he remains short of breath.  Objective   Vitals:  Vitals:   08/28/20 0900 08/28/20 1015  BP: 134/89 (!) 147/92  Pulse: 95 100  Resp: (!) 24 (!) 22  Temp:    SpO2: 91% 93%   Exam:  Constitutional:  . The patient is awake, alert, and oriented x 3. No acute distress. Respiratory:  . No increased work of breathing. . No wheezes, rales, or rhonchi . No tactile fremitus Cardiovascular:  . Regular rate and rhythm . No murmurs, ectopy, or gallups. . No lateral PMI. No thrills. Abdomen:  . Abdomen is soft, non-tender, non-distended . No hernias, masses, or organomegaly . Normoactive bowel sounds.  Musculoskeletal:  . No cyanosis, clubbing, or edema Skin:  . No rashes, lesions, ulcers . palpation of skin: no induration or nodules Neurologic:  .  CN 2-12 intact . Sensation all 4 extremities intact Psychiatric:  . Mental status o Mood, affect appropriate o Orientation to person, place, time  . judgment and insight appear intact  I have personally reviewed the following:   Today's Data  . Vitals, BMP, CBC  Imaging  . CTA chest . CXR  Cardiology Data  . EKG  Scheduled Meds: . apixaban   Does not  apply Once  . [START ON 08/29/2020] atorvastatin  20 mg Oral Daily  . diltiazem  240 mg Oral Daily  . sodium chloride flush  3 mL Intravenous Q12H   Continuous Infusions: . heparin 1,400 Units/hr (08/28/20 0636)    Principal Problem:   Acute pulmonary embolism (HCC) Active Problems:   Primary hypertension   Typical atrial flutter (HCC)   LOS: 0 days   A & P  Acute bilateral PE:  Presents with acute-onset of chest pressure and SOB, was found to be in new atrial flutter with rate 170, continued to have chest pressure and SOB after successful cardioversion, and underwent CTA chest which reveals bilateral PE without heart strain. The patient has been placed on a heparin drip. He will be converted to Eliquis for discharge to home. Pt is a smoker and is overweight. He should undergo screening colonoscopy and have PSA checked as outpatient. He does deny long car or airplane trips, immobilization, or injury to a long bone. Consideration is given to hypercoaguable state, but would be unusual to present in the the 7th decade of life. LE doppler is negative for DVT.   Rapid Atrial flutter: Rate 170. Likely triggerd by acute pulmonary embolus. He underwent electrical cardioversion by cardiology. Now in SR. Cardiology recommends diltiazem CD, eliquis, ACE I, statin and outpatient cardilogy follow up.    Hypomagnesemia: Supplemented. Monitor.  Tobacco Abuse: Cessation counseled. Likely contributor to bilateral pulmonary embolus.  Obesity: Pt is advised to pursue weight loss as outpatient through increased activity and diet modification. Complicates all cares and is likely to have contributed to the developmend of pulmonary embolus.  I have seen and examined this patient myself. I have spent 34 minutes in his evaluation and care.   DVT prophylaxis: IV heparin  Code Status: Full  Family Communication: Discussed with patient  Disposition Plan:  Patient is from: Home  Anticipated d/c is to:  Home  Anticipated d/c date is: 08/29/20 Patient currently: On IV heparin, pending transition to Eliquis.   Mame Twombly, DO [facility] Direct contact: see [email or web address]  7PM-7AM contact night coverage as above 08/28/2020, 1:09 PM  LOS: 0 days

## 2020-08-29 DIAGNOSIS — Z6832 Body mass index (BMI) 32.0-32.9, adult: Secondary | ICD-10-CM | POA: Diagnosis not present

## 2020-08-29 DIAGNOSIS — E669 Obesity, unspecified: Secondary | ICD-10-CM | POA: Diagnosis present

## 2020-08-29 DIAGNOSIS — Z8249 Family history of ischemic heart disease and other diseases of the circulatory system: Secondary | ICD-10-CM | POA: Diagnosis not present

## 2020-08-29 DIAGNOSIS — Z01810 Encounter for preprocedural cardiovascular examination: Secondary | ICD-10-CM | POA: Diagnosis not present

## 2020-08-29 DIAGNOSIS — F17290 Nicotine dependence, other tobacco product, uncomplicated: Secondary | ICD-10-CM | POA: Diagnosis present

## 2020-08-29 DIAGNOSIS — Z7982 Long term (current) use of aspirin: Secondary | ICD-10-CM | POA: Diagnosis not present

## 2020-08-29 DIAGNOSIS — E785 Hyperlipidemia, unspecified: Secondary | ICD-10-CM | POA: Diagnosis present

## 2020-08-29 DIAGNOSIS — Z79899 Other long term (current) drug therapy: Secondary | ICD-10-CM | POA: Diagnosis not present

## 2020-08-29 DIAGNOSIS — E119 Type 2 diabetes mellitus without complications: Secondary | ICD-10-CM | POA: Diagnosis present

## 2020-08-29 DIAGNOSIS — E7800 Pure hypercholesterolemia, unspecified: Secondary | ICD-10-CM | POA: Diagnosis not present

## 2020-08-29 DIAGNOSIS — Z7901 Long term (current) use of anticoagulants: Secondary | ICD-10-CM | POA: Diagnosis not present

## 2020-08-29 DIAGNOSIS — I4891 Unspecified atrial fibrillation: Secondary | ICD-10-CM | POA: Diagnosis present

## 2020-08-29 DIAGNOSIS — K219 Gastro-esophageal reflux disease without esophagitis: Secondary | ICD-10-CM | POA: Diagnosis present

## 2020-08-29 DIAGNOSIS — Z20822 Contact with and (suspected) exposure to covid-19: Secondary | ICD-10-CM | POA: Diagnosis present

## 2020-08-29 DIAGNOSIS — F1290 Cannabis use, unspecified, uncomplicated: Secondary | ICD-10-CM | POA: Diagnosis present

## 2020-08-29 DIAGNOSIS — I4510 Unspecified right bundle-branch block: Secondary | ICD-10-CM | POA: Diagnosis present

## 2020-08-29 DIAGNOSIS — I10 Essential (primary) hypertension: Secondary | ICD-10-CM | POA: Diagnosis not present

## 2020-08-29 DIAGNOSIS — J9811 Atelectasis: Secondary | ICD-10-CM | POA: Diagnosis present

## 2020-08-29 DIAGNOSIS — I471 Supraventricular tachycardia: Secondary | ICD-10-CM | POA: Diagnosis present

## 2020-08-29 DIAGNOSIS — I2699 Other pulmonary embolism without acute cor pulmonale: Secondary | ICD-10-CM | POA: Diagnosis not present

## 2020-08-29 DIAGNOSIS — I483 Typical atrial flutter: Secondary | ICD-10-CM | POA: Diagnosis not present

## 2020-08-29 LAB — HOMOCYSTEINE: Homocysteine: 12.3 umol/L (ref 0.0–17.2)

## 2020-08-29 LAB — BASIC METABOLIC PANEL
Anion gap: 9 (ref 5–15)
BUN: 13 mg/dL (ref 8–23)
CO2: 22 mmol/L (ref 22–32)
Calcium: 8.5 mg/dL — ABNORMAL LOW (ref 8.9–10.3)
Chloride: 105 mmol/L (ref 98–111)
Creatinine, Ser: 0.81 mg/dL (ref 0.61–1.24)
GFR, Estimated: >60 mL/min (ref >60)
Glucose, Bld: 101 mg/dL — ABNORMAL HIGH (ref 70–99)
Potassium: 3.9 mmol/L (ref 3.5–5.1)
Sodium: 136 mmol/L (ref 135–145)

## 2020-08-29 LAB — PROTEIN C ACTIVITY: Protein C Activity: 56 % — ABNORMAL LOW (ref 73–180)

## 2020-08-29 LAB — CBC
HCT: 37.1 % — ABNORMAL LOW (ref 39.0–52.0)
Hemoglobin: 12 g/dL — ABNORMAL LOW (ref 13.0–17.0)
MCH: 32.8 pg (ref 26.0–34.0)
MCHC: 32.3 g/dL (ref 30.0–36.0)
MCV: 101.4 fL — ABNORMAL HIGH (ref 80.0–100.0)
Platelets: 142 10*3/uL — ABNORMAL LOW (ref 150–400)
RBC: 3.66 MIL/uL — ABNORMAL LOW (ref 4.22–5.81)
RDW: 13 % (ref 11.5–15.5)
WBC: 8.6 10*3/uL (ref 4.0–10.5)
nRBC: 0 % (ref 0.0–0.2)

## 2020-08-29 LAB — PROTEIN S, TOTAL: Protein S Ag, Total: 75 % (ref 60–150)

## 2020-08-29 LAB — PROTEIN S ACTIVITY: Protein S Activity: 92 % (ref 63–140)

## 2020-08-29 LAB — BETA-2-GLYCOPROTEIN I ABS, IGG/M/A
Beta-2 Glyco I IgG: <9 GPI IgG units (ref 0–20)
Beta-2-Glycoprotein I IgA: <9 GPI IgA units (ref 0–25)
Beta-2-Glycoprotein I IgM: <9 GPI IgM units (ref 0–32)

## 2020-08-29 LAB — PROTEIN C, TOTAL: Protein C, Total: 49 % — ABNORMAL LOW (ref 60–150)

## 2020-08-29 MED ORDER — DILTIAZEM HCL ER COATED BEADS 180 MG PO CP24
300.0000 mg | ORAL_CAPSULE | Freq: Every day | ORAL | Status: DC
  Administered 2020-08-29 – 2020-08-30 (×2): 300 mg via ORAL
  Filled 2020-08-29 (×2): qty 1

## 2020-08-29 MED ORDER — DRONEDARONE HCL 400 MG PO TABS
400.0000 mg | ORAL_TABLET | Freq: Two times a day (BID) | ORAL | Status: DC
  Administered 2020-08-29 – 2020-08-30 (×3): 400 mg via ORAL
  Filled 2020-08-29 (×3): qty 1

## 2020-08-29 MED ORDER — DILTIAZEM HCL ER COATED BEADS 180 MG PO CP24: 300.0000 mg | ORAL_CAPSULE | Freq: Every day | ORAL | Status: DC

## 2020-08-29 MED ORDER — METOPROLOL TARTRATE 25 MG PO TABS
25.0000 mg | ORAL_TABLET | Freq: Two times a day (BID) | ORAL | Status: DC
  Administered 2020-08-29 (×2): 25 mg via ORAL
  Filled 2020-08-29 (×2): qty 1

## 2020-08-29 MED ORDER — LEVALBUTEROL HCL 0.63 MG/3ML IN NEBU: 0.6300 mg | INHALATION_SOLUTION | Freq: Four times a day (QID) | RESPIRATORY_TRACT | Status: DC | PRN

## 2020-08-29 NOTE — Progress Notes (Signed)
PROGRESS NOTE  Carl [name]  DOB: Jan 18, 1956  PCP: Carl Kill, PA-C ZSM:[id number]  DOA: 08/27/2020  LOS: 1 day   Chief Complaint  Patient presents with  . Chest Pain  . Atrial Flutter    Brief narrative: Carl [name] is a 64 y.o. male with PMH of chronic smoking, HTN, HLD. Patient presented to the ED on 08/27/2020 for evaluation of chest pressure and shortness of breath that woke him from his sleep 12 hours prior to presentation. EMS found the patient to be in a tachyarrhythmia with rate 170, administered adenosine x2, diltiazem x3, anddid not observe any significant change in his heart rate and subsequently brought to the ED.  In the ED, patient was afebrile, saturating mid 90s on room air, tachycardic in the 170s, and with systolic blood pressure in the upper 80s.  EKG showed tachyarrhythmia with rate 173, RBBB, and LAFB.  Chest x-ray with mild atelectasis and hypoinflation.  Chemistry panel notable for magnesium of 1.6.  CBC with leukocytosis and macrocytosis.  High-sensitivity troponin was elevated to 39 and then 31.   Cardiology was consulted by the ED physician. Patient underwent successful electrical cardioversion in the ED and was started on oral Cardizem and Eliquis.  But he continued to complain of chest pressure and dyspnea despite sustaining a sinus rhythm.  He underwent CTA chest which showed bilateral upper lobe segmental and right middle lobe segmental pulmonary embolism  without evidence for heart strain.   Bilateral lower extremity DVT scan was negative.  He was admitted to hospitalist service.  Overnight, patient went into A. fib with RVR again and was restarted on Cardizem drip.    Subjective: Patient was seen and examined this morning.   Pleasant middle-aged Caucasian male.  Looks older for his age.  Not in physical distress. This morning, he was running tachycardic to 100s.  I increased oral Cardizem from 240 mg to 300 mg daily despite  which he continued to remain tachycardic and worsened up to 150s.  Cardizem drip was titrated up to a max of 15 mg/h.  I discussed the case with cardiologist Dr. Jacinto Halim. He plans to start the patient on Multaq and beta-blocker as well and titrate off Cardizem drip.  Assessment/Plan: Acute bilateral pulmonary embolism -Presented with acute onset chest pressure and shortness of breath. -DVT scan negative.  Never had DVT or PE in the past.   -Anticoagulation started with Eliquis. -Hypercoagulability work-up as an outpatient.  A. fib with RVR -Presented with a heart rate of 170s.  Successful cardioversion in the ED but rhythm returned to A. fib with RVR since last night.  -Currently on oral Cardizem and Cardizem drip at a maximal rate of 50 mg/h. - I discussed the case with cardiologist Dr. Jacinto Halim. He plans to start the patient on Multaq and beta-blocker as well and titrate off Cardizem drip. -Continue to monitor in telemetry.  Hypomagnesemia -Replaced.  Recheck. Recent Labs  Lab 08/27/20 1430 08/28/20 0450 08/29/20 0257  K 4.9 4.2 3.9  MG 1.6* 2.6*  --    Chronic smoking -Counseled to quit.  Mobility: Encourage ambulation Code Status:   Code Status: Full Code  Nutritional status: Body mass index is 32.28 kg/m.     Diet Order            Diet Heart Room service appropriate? Yes; Fluid consistency: Thin  Diet effective now                 DVT prophylaxis:  apixaban (  ELIQUIS) tablet 10 mg  apixaban (ELIQUIS) tablet 5 mg   Antimicrobials:  None Fluid: None Consultants: Cardiologist Family Communication:  None at bedside  Status is: Inpatient  Remains inpatient appropriate because patient remains in A. fib with RVR, needs meds adjustment  Dispo: The patient is from: Home              Anticipated d/c is to: Home              Anticipated d/c date is: 2 days              Patient currently is not medically stable to d/c.       Infusions:    Scheduled Meds: .  apixaban  10 mg Oral BID   Followed by  . [START ON 09/04/2020] apixaban  5 mg Oral BID  . atorvastatin  20 mg Oral Daily  . diltiazem  300 mg Oral Daily  . dronedarone  400 mg Oral BID WC  . metoprolol tartrate  25 mg Oral BID  . sodium chloride flush  3 mL Intravenous Q12H    Antimicrobials: Anti-infectives (From admission, onward)   None      PRN meds: acetaminophen **OR** acetaminophen, HYDROcodone-acetaminophen, levalbuterol, morphine injection, ondansetron **OR** ondansetron (ZOFRAN) IV, polyethylene glycol   Objective: Vitals:   08/28/20 2209 08/29/20 0419  BP: 124/76 133/83  Pulse: 68 76  Resp:  20  Temp: 98 F (36.7 C) 98.6 F (37 C)  SpO2: 93% 96%    Intake/Output Summary (Last 24 hours) at 08/29/2020 1308 Last data filed at 08/29/2020 0902 Gross per 24 hour  Intake 1207.73 ml  Output --  Net 1207.73 ml   Filed Weights   08/27/20 1430  Weight: 102.1 kg   Weight change:  Body mass index is 32.28 kg/m.   Physical Exam: General exam: Appears calm and comfortable.  Not in physical distress Skin: No rashes, lesions or ulcers. HEENT: Atraumatic, normocephalic, supple neck, no obvious bleeding Lungs: Clear to auscultation bilaterally CVS: Irregular tachycardia, no murmur GI/Abd soft, nontender, nondistended, bowel sound present CNS: Alert, awake, oriented x3 Psychiatry: Mood appropriate Extremities: No pedal edema, no calf tenderness  Data Review: I have personally reviewed the laboratory data and studies available.  Recent Labs  Lab 08/27/20 1430 08/28/20 0450 08/29/20 0257  WBC 16.7* 12.7* 8.6  NEUTROABS 14.1*  --   --   HGB 14.9 13.3 12.0*  HCT 45.6 40.9 37.1*  MCV 102.0* 102.5* 101.4*  PLT 162 138* 142*   Recent Labs  Lab 08/27/20 1430 08/28/20 0450 08/29/20 0257  NA 141 135 136  K 4.9 4.2 3.9  CL 108 104 105  CO2 18* 21* 22  GLUCOSE 128* 114* 101*  BUN 17 16 13   CREATININE 0.98 0.91 0.81  CALCIUM 8.8* 8.5* 8.5*  MG 1.6* 2.6*   --     F/u labs ordered.  Signed, , MD [facility] 08/29/2020

## 2020-08-29 NOTE — Progress Notes (Addendum)
Patient titrated to 15mg /hr (MAX) on diltiazem gtt for HR in 150's while at rest. MD Dahal alerted.

## 2020-08-30 LAB — PHOSPHORUS: Phosphorus: 2.4 mg/dL — ABNORMAL LOW (ref 2.5–4.6)

## 2020-08-30 LAB — CBC
HCT: 38.1 % — ABNORMAL LOW (ref 39.0–52.0)
Hemoglobin: 12.6 g/dL — ABNORMAL LOW (ref 13.0–17.0)
MCH: 32.9 pg (ref 26.0–34.0)
MCHC: 33.1 g/dL (ref 30.0–36.0)
MCV: 99.5 fL (ref 80.0–100.0)
Platelets: 166 10*3/uL (ref 150–400)
RBC: 3.83 MIL/uL — ABNORMAL LOW (ref 4.22–5.81)
RDW: 12.7 % (ref 11.5–15.5)
WBC: 9.2 10*3/uL (ref 4.0–10.5)
nRBC: 0 % (ref 0.0–0.2)

## 2020-08-30 LAB — CARDIOLIPIN ANTIBODIES, IGG, IGM, IGA
Anticardiolipin IgA: <9 APL U/mL (ref 0–11)
Anticardiolipin IgG: <9 GPL U/mL (ref 0–14)
Anticardiolipin IgM: 10 MPL U/mL (ref 0–12)

## 2020-08-30 LAB — DRVVT MIX: dRVVT Mix: 46.6 s — ABNORMAL HIGH (ref 0.0–40.4)

## 2020-08-30 LAB — BASIC METABOLIC PANEL
Anion gap: 9 (ref 5–15)
BUN: 11 mg/dL (ref 8–23)
CO2: 22 mmol/L (ref 22–32)
Calcium: 8.7 mg/dL — ABNORMAL LOW (ref 8.9–10.3)
Chloride: 106 mmol/L (ref 98–111)
Creatinine, Ser: 0.92 mg/dL (ref 0.61–1.24)
GFR, Estimated: >60 mL/min (ref >60)
Glucose, Bld: 103 mg/dL — ABNORMAL HIGH (ref 70–99)
Potassium: 3.9 mmol/L (ref 3.5–5.1)
Sodium: 137 mmol/L (ref 135–145)

## 2020-08-30 LAB — LUPUS ANTICOAGULANT PANEL
DRVVT: 57.8 s — ABNORMAL HIGH (ref 0.0–47.0)
PTT Lupus Anticoagulant: 48.2 s (ref 0.0–51.9)

## 2020-08-30 LAB — DRVVT CONFIRM: dRVVT Confirm: 0.9 ratio (ref 0.8–1.2)

## 2020-08-30 LAB — MAGNESIUM: Magnesium: 2 mg/dL (ref 1.7–2.4)

## 2020-08-30 MED ORDER — APIXABAN 5 MG PO TABS: ORAL_TABLET | ORAL | 0 refills | Status: DC

## 2020-08-30 MED ORDER — DILTIAZEM HCL ER COATED BEADS 300 MG PO CP24: 300.0000 mg | ORAL_CAPSULE | Freq: Every day | ORAL | 0 refills | Status: DC

## 2020-08-30 MED ORDER — METOPROLOL TARTRATE 50 MG PO TABS: 50.0000 mg | ORAL_TABLET | Freq: Two times a day (BID) | ORAL | 0 refills | Status: DC

## 2020-08-30 MED ORDER — METOPROLOL TARTRATE 50 MG PO TABS
50.0000 mg | ORAL_TABLET | Freq: Two times a day (BID) | ORAL | Status: DC
  Administered 2020-08-30: 50 mg via ORAL
  Filled 2020-08-30: qty 1

## 2020-08-30 MED ORDER — DRONEDARONE HCL 400 MG PO TABS: 400.0000 mg | ORAL_TABLET | Freq: Two times a day (BID) | ORAL | 0 refills | Status: DC

## 2020-08-30 MED ORDER — BACLOFEN 10 MG PO TABS
10.0000 mg | ORAL_TABLET | Freq: Three times a day (TID) | ORAL | Status: DC
  Administered 2020-08-30 (×2): 10 mg via ORAL
  Filled 2020-08-30 (×2): qty 1

## 2020-08-30 MED ORDER — LEVALBUTEROL TARTRATE 45 MCG/ACT IN AERO: 1.0000 | INHALATION_SPRAY | RESPIRATORY_TRACT | 2 refills | Status: DC | PRN

## 2020-08-30 MED ORDER — K PHOS MONO-SOD PHOS DI & MONO 155-852-130 MG PO TABS
500.0000 mg | ORAL_TABLET | Freq: Once | ORAL | Status: AC
  Administered 2020-08-30: 500 mg via ORAL
  Filled 2020-08-30: qty 2

## 2020-08-30 NOTE — Discharge Summary (Signed)
Physician Discharge Summary  Carl [name] ZOX:[id number] DOB: 1955/11/22 DOA: 08/27/2020  PCP: Roger Kill, PA-C  Admit date: 08/27/2020 Discharge date: 08/30/2020  Admitted From: Home Discharge disposition: Home   Code Status: Full Code  Diet Recommendation: Cardiac diet  Discharge Diagnosis:   Principal Problem:   Acute pulmonary embolism (HCC) Active Problems:   Primary hypertension   Typical atrial flutter (HCC)   History of Present Illness / Brief narrative:  Carl [name] is a 64 y.o. male with PMH of chronic smoking, HTN, HLD. Patient presented to the ED on 08/27/2020 for evaluation of chest pressure and shortness of breath that woke him from his sleep 12 hours prior to presentation. EMS found the patient to be in a tachyarrhythmia with rate 170, administered adenosine x2, diltiazem x3, anddid not observe any significant change in his heart rate and subsequently brought to the ED.  In the ED, patient was afebrile, saturating mid 90s on room air, tachycardic in the 170s, and with systolic blood pressure in the upper 80s.  EKG showed tachyarrhythmia with rate 173, RBBB, and LAFB.  Chest x-ray with mild atelectasis and hypoinflation.  Chemistry panel notable for magnesium of 1.6.  CBC with leukocytosis and macrocytosis.  High-sensitivity troponin was elevated to 39 and then 31.   Cardiology was consulted by the ED physician. Patient underwent successful electrical cardioversion in the ED and was started on oral Cardizem and Eliquis.  But he continued to complain of chest pressure and dyspnea despite sustaining a sinus rhythm.  He underwent CTA chest which showed bilateral upper lobe segmental and right middle lobe segmental pulmonary embolism  without evidence for heart strain.  Bilateral lower extremity DVT scan was negative.  He was admitted to hospitalist service.  Subjective:  Seen and examined this morning. Overnight, patient's heart rate was  well controlled but this morning he flipped back to A. fib with RVR again. With administration of morning meds, his heart rate is now improving. Blood pressure remained stable.   Hospital Course:  Acute bilateral pulmonary embolism -Presented with acute onset chest pressure and shortness of breath. -DVT scan negative.  Never had DVT or PE in the past.   -Anticoagulation started with Eliquis. -Hypercoagulability work-up as an outpatient.  A. fib with RVR -Presented with a heart rate of 170s.  Successful cardioversion in the ED but rhythm returned to A. fib with RVR since last night.  -I discussed the case with cardiologist Dr. Jacinto Halim. Heart rate is currently controlled on metoprolol 50 mg twice daily, Multaq 40 mg twice daily and Cardizem CD 300 mg daily. Patient will be discharged home on the same. We will monitor his heart rate and blood pressure at home.  -We will follow up with cardiologist Dr. Jacinto Halim as an outpatient.  Hypomagnesemia/hypophosphatemia -Phosphorus level low today. Given oral replacement. Recent Labs  Lab 08/27/20 1430 08/28/20 0450 08/29/20 0257 08/30/20 0044  K 4.9 4.2 3.9 3.9  MG 1.6* 2.6*  --  2.0  PHOS  --   --   --  2.4*   Chronic smoking -Counseled to quit.  Stable for discharge to home today. Wound care:    Discharge Exam:   Vitals:   08/29/20 1414 08/29/20 1947 08/30/20 0605 08/30/20 0745  BP: 130/82 132/81 132/80   Pulse: 85 86 89 (!) 140  Resp: Temp: 98.7 F (37.1 C) 98.1 F (36.7 C) 99.9 F (37.7 C)   TempSrc: Oral Oral Oral   SpO2: 97%  97% 96%   Weight:      Height:        Body mass index is 32.28 kg/m.  General exam: Appears calm and comfortable. Not in physical distress Skin: No rashes, lesions or ulcers. HEENT: Atraumatic, normocephalic, supple neck, no obvious bleeding Lungs: Clear to auscultation bilaterally CVS: Rate controlled A. fib, no murmur GI/Abd soft, nontender, nondistended, bowel sound present CNS:  Alert, awake, oriented x3 Psychiatry: Mood appropriate Extremities: No pedal edema, no calf tenderness  Follow ups:   Discharge Instructions    Amb referral to [facility]   Complete by: As directed    Diet - low sodium heart healthy   Complete by: As directed    Increase activity slowly   Complete by: As directed       Follow-up Information    Cantwell, Celeste C, PA-C Follow up on 09/03/2020.   Specialty: Cardiology Why: 9 AM. Bring all medication Contact information: [address] [place] [address] [phone]        Roger KillWilliams, Breejante J, PA-C Follow up.   Specialty: Physician Assistant Contact information: [address] [place] [address] [phone]               Recommendations for Outpatient Follow-Up:   1. Follow-up with PCP as an outpatient 2. Follow with cardiology as an outpatient  Discharge Instructions:  Follow with Primary MD Roger KillWilliams, Breejante J, PA-C in 7 days   Get CBC/BMP checked in next visit within 1 week by PCP or SNF MD ( we routinely change or add medications that can affect your baseline labs and fluid status, therefore we recommend that you get the mentioned basic workup next visit with your PCP, your PCP may decide not to get them or add new tests based on their clinical decision)  On your next visit with your PCP, please Get Medicines reviewed and adjusted.  Please request your PCP  to go over all Hospital Tests and Procedure/Radiological results at the follow up, please get all Hospital records sent to your Prim MD by signing hospital release before you go home.  Activity: As tolerated with Full fall precautions use walker/cane & assistance as needed  For Heart failure patients - Check your Weight same time everyday, if you gain over 2 pounds, or you develop in leg swelling, experience more shortness of breath or chest pain, call your Primary MD immediately. Follow Cardiac Low Salt Diet and 1.5 lit/day  fluid restriction.  If you have smoked or chewed Tobacco in the last 2 yrs please stop smoking, stop any regular Alcohol  and or any Recreational drug use.  If you experience worsening of your admission symptoms, develop shortness of breath, life threatening emergency, suicidal or homicidal thoughts you must seek medical attention immediately by calling 911 or calling your MD immediately  if symptoms less severe.  You Must read complete instructions/literature along with all the possible adverse reactions/side effects for all the Medicines you take and that have been prescribed to you. Take any new Medicines after you have completely understood and accpet all the possible adverse reactions/side effects.   Do not drive, operate heavy machinery, perform activities at heights, swimming or participation in water activities or provide baby sitting services if your were admitted for syncope or siezures until you have seen by Primary MD or a Neurologist and advised to do so again.  Do not drive when taking Pain medications.  Do not take more than prescribed  Pain, Sleep and Anxiety Medications  Wear Seat belts while driving.   Please note You were cared for by a hospitalist during your hospital stay. If you have any questions about your discharge medications or the care you received while you were in the hospital after you are discharged, you can call the unit and asked to speak with the hospitalist on call if the hospitalist that took care of you is not available. Once you are discharged, your primary care physician will handle any further medical issues. Please note that NO REFILLS for any discharge medications will be authorized once you are discharged, as it is imperative that you return to your primary care physician (or establish a relationship with a primary care physician if you do not have one) for your aftercare needs so that they can reassess your need for medications and monitor your lab  values.    Allergies as of 08/30/2020   No Known Allergies     Medication List    STOP taking these medications   aspirin 325 MG EC tablet   ibuprofen 200 MG tablet Commonly known as: ADVIL   Ventolin HFA 108 (90 Base) MCG/ACT inhaler Generic drug: albuterol     TAKE these medications   allopurinol 100 MG tablet Commonly known as: ZYLOPRIM Take 100 mg by mouth in the morning.   apixaban 5 MG Tabs tablet Commonly known as: ELIQUIS 10 mg twice daily for 7 days followed by 5 mg twice daily for long-term   ascorbic acid 500 MG tablet Commonly known as: VITAMIN C Take 500 mg by mouth 3 (three) times a week.   atorvastatin 10 MG tablet Commonly known as: LIPITOR Take 10 mg by mouth daily.   [employer] B12 GUMMIES PO Take 1-2 tablets by mouth See admin instructions. Chew 1-2 gummies by mouth in the morning   diltiazem 300 MG 24 hr capsule Commonly known as: CARDIZEM CD Take 1 capsule (300 mg total) by mouth daily.   dronedarone 400 MG tablet Commonly known as: MULTAQ Take 1 tablet (400 mg total) by mouth 2 (two) times daily with a meal.   levalbuterol 45 MCG/ACT inhaler Commonly known as: XOPENEX HFA Inhale 1 puff into the lungs every 4 (four) hours as needed for wheezing.   metoprolol tartrate 50 MG tablet Commonly known as: LOPRESSOR Take 1 tablet (50 mg total) by mouth 2 (two) times daily.   RA Probiotic Digestive Care Caps Take 1 capsule by mouth in the morning.   tadalafil 20 MG tablet Commonly known as: CIALIS Take 20 mg by mouth daily as needed for erectile dysfunction.       Time coordinating discharge: 35 minutes  The results of significant diagnostics from this hospitalization (including imaging, microbiology, ancillary and laboratory) are listed below for reference.    Procedures and Diagnostic Studies:   CT Angio Chest PE W and/or Wo Contrast  Result Date: 08/27/2020 CLINICAL DATA:  Shortness of breath and chest pain EXAM: CT ANGIOGRAPHY CHEST  WITH CONTRAST TECHNIQUE: Multidetector CT imaging of the chest was performed using the standard protocol during bolus administration of intravenous contrast. Multiplanar CT image reconstructions and MIPs were obtained to evaluate the vascular anatomy. CONTRAST:  88mL OMNIPAQUE IOHEXOL 350 MG/ML SOLN COMPARISON:  None. FINDINGS: Cardiovascular: Satisfactory opacification of the pulmonary arteries to the segmental level. There are filling defects in segmental branches of bilateral upper and right middle lobes. Additional suspected filling defects within subsegmental branches. Left atrial enlargement. No evidence of right heart strain.  No pericardial effusion. Mediastinum/Nodes: There are no enlarged lymph nodes identified. Thyroid and esophagus are unremarkable. Lungs/Pleura: Patchy areas of atelectasis. No pleural effusion or pneumothorax. Upper Abdomen: No acute abnormality. Musculoskeletal: No acute osseous abnormality. Review of the MIP images confirms the above findings. IMPRESSION: Acute segmental and subsegmental pulmonary emboli bilaterally. No evidence of right heart strain. Patchy bilateral atelectasis. These results were called by telephone at the time of interpretation on 08/27/2020 at 8:52 pm to provider [facility] , who verbally acknowledged these results. Electronically Signed   By: Guadlupe Spanish M.D.   On: 08/27/2020 20:59   DG Chest Port 1 View  Result Date: 08/27/2020 CLINICAL DATA:  Chest pain. EXAM: PORTABLE CHEST 1 VIEW COMPARISON:  September 23, 2017. FINDINGS: The heart size and mediastinal contours are within normal limits. No pneumothorax or pleural effusion is noted. Hypoinflation of the lungs is noted with mild bibasilar subsegmental atelectasis. The visualized skeletal structures are unremarkable. IMPRESSION: Hypoinflation of the lungs with mild bibasilar subsegmental atelectasis. Electronically Signed   By: Lupita Raider M.D.   On: 08/27/2020 14:42   ECHOCARDIOGRAM  COMPLETE  Result Date: 08/28/2020    ECHOCARDIOGRAM REPORT   Patient Name:   Carl [name] Date of Exam: 08/28/2020 Medical Rec #:  [id number]     Height:       70.0 in Accession #:    [id number]    Weight:       225.0 lb Date of Birth:  1956-10-23     BSA:          2.194 m Patient Age:    64 years      BP:           134/89 mmHg Patient Gender: M             HR:           95 bpm. Exam Location:  Inpatient Procedure: 2D Echo, Cardiac Doppler and Color Doppler Indications:     Pulmonary embolus, Atrial flutter  History:         Patient has no prior history of Echocardiogram examinations.                  Arrythmias:Atrial Flutter, Signs/Symptoms:Pulmonary embolus,                  Shortness of Breath and Chest Pain; Risk Factors:Hypertension                  and Dyslipidemia.  Sonographer:     Lavenia Atlas RDCS Referring Phys:  [id number] Lavone Neri OPYD Diagnosing Phys: Yates Decamp MD IMPRESSIONS  1. Left ventricular ejection fraction, by estimation, is 50 to 55%. The left ventricle has low normal function. The left ventricle has no regional wall motion abnormalities. Left ventricular diastolic parameters were normal.  2. Right ventricular systolic function is normal. The right ventricular size is normal. There is moderately elevated pulmonary artery systolic pressure. The estimated right ventricular systolic pressure is 40.6 mmHg.  3. The mitral valve is normal in structure. Trivial mitral valve regurgitation. No evidence of mitral stenosis.  4. The aortic valve is normal in structure. Aortic valve regurgitation is not visualized.  5. The inferior vena cava is dilated in size with <50% respiratory variability, suggesting right atrial pressure of 15 mmHg. FINDINGS  Left Ventricle: Left ventricular ejection fraction, by estimation, is 50 to 55%. The left ventricle has low normal function. The left ventricle has no regional wall motion abnormalities. The  left ventricular internal cavity size was normal in size. There  is no left ventricular hypertrophy. Left ventricular diastolic parameters were normal. Right Ventricle: The right ventricular size is normal. No increase in right ventricular wall thickness. Right ventricular systolic function is normal. There is moderately elevated pulmonary artery systolic pressure. The tricuspid regurgitant velocity is 2.53 m/s, and with an assumed right atrial pressure of 15 mmHg, the estimated right ventricular systolic pressure is 40.6 mmHg. Left Atrium: Left atrial size was normal in size. Right Atrium: Right atrial size was normal in size. Pericardium: There is no evidence of pericardial effusion. Mitral Valve: The mitral valve is normal in structure. Trivial mitral valve regurgitation. No evidence of mitral valve stenosis. Tricuspid Valve: The tricuspid valve is normal in structure. Tricuspid valve regurgitation is mild . No evidence of tricuspid stenosis. Aortic Valve: The aortic valve is normal in structure. Aortic valve regurgitation is not visualized. Pulmonic Valve: The pulmonic valve was grossly normal. Pulmonic valve regurgitation is not visualized. Aorta: The aortic root is normal in size and structure. Pulmonary Artery: The pulmonary artery is of normal size. Venous: The inferior vena cava is dilated in size with less than 50% respiratory variability, suggesting right atrial pressure of 15 mmHg. IAS/Shunts: No atrial level shunt detected by color flow Doppler.  LEFT VENTRICLE PLAX 2D LVIDd:         6.30 cm  Diastology LVIDs:         4.40 cm  LV e' medial:    6.64 cm/s LV PW:         1.10 cm  LV E/e' medial:  15.2 LV IVS:        1.20 cm  LV e' lateral:   9.90 cm/s LVOT diam:     2.20 cm  LV E/e' lateral: 10.2 LV SV:         68 LV SV Index:   31 LVOT Area:     3.80 cm  RIGHT VENTRICLE RV Basal diam:  2.90 cm RV S prime:     12.80 cm/s TAPSE (M-mode): 3.2 cm RVSP:           40.6 mmHg LEFT ATRIUM             Index       RIGHT ATRIUM            Index LA diam:        4.60 cm 2.10 cm/m   RA Pressure: 15.00 mmHg LA Vol (A2C):   37.8 ml 17.23 ml/m RA Area:     15.10 cm LA Vol (A4C):   41.6 ml 18.96 ml/m RA Volume:   37.20 ml   16.95 ml/m LA Biplane Vol: 40.1 ml 18.27 ml/m  AORTIC VALVE LVOT Vmax:   83.50 cm/s LVOT Vmean:  62.000 cm/s LVOT VTI:    0.179 m  AORTA Ao Root diam: 3.20 cm MITRAL VALVE                TRICUSPID VALVE MV Area (PHT): 7.44 cm     TR Peak grad:   25.6 mmHg MV Decel Time: 102 msec     TR Vmax:        253.00 cm/s MV E velocity: 101.00 cm/s  Estimated RAP:  15.00 mmHg MV A velocity: 51.80 cm/s   RVSP:           40.6 mmHg MV E/A ratio:  1.95  SHUNTS                             Systemic VTI:  0.18 m                             Systemic Diam: 2.20 cm Yates Decamp MD Electronically signed by Yates Decamp MD Signature Date/Time: 08/28/2020/10:19:06 AM    Final    VAS [place] LOWER EXTREMITY VENOUS (DVT)  Result Date: 08/28/2020  Lower Venous DVTStudy Indications: Pulmonary embolism.  Comparison Study: no prior Performing Technologist: Blanch Media RVS  Examination Guidelines: A complete evaluation includes B-mode imaging, spectral Doppler, color Doppler, and power Doppler as needed of all accessible portions of each vessel. Bilateral testing is considered an integral part of a complete examination. Limited examinations for reoccurring indications may be performed as noted. The reflux portion of the exam is performed with the patient in reverse Trendelenburg.  +---------+---------------+---------+-----------+----------+--------------+ RIGHT    CompressibilityPhasicitySpontaneityPropertiesThrombus Aging +---------+---------------+---------+-----------+----------+--------------+ CFV      Full           Yes      Yes                                 +---------+---------------+---------+-----------+----------+--------------+ SFJ      Full                                                         +---------+---------------+---------+-----------+----------+--------------+ FV Prox  Full                                                        +---------+---------------+---------+-----------+----------+--------------+ FV Mid   Full                                                        +---------+---------------+---------+-----------+----------+--------------+ FV DistalFull                                                        +---------+---------------+---------+-----------+----------+--------------+ PFV      Full                                                        +---------+---------------+---------+-----------+----------+--------------+ POP      Full           Yes      Yes                                 +---------+---------------+---------+-----------+----------+--------------+  PTV      Full                                                        +---------+---------------+---------+-----------+----------+--------------+ PERO     Full                                                        +---------+---------------+---------+-----------+----------+--------------+   +---------+---------------+---------+-----------+----------+--------------+ LEFT     CompressibilityPhasicitySpontaneityPropertiesThrombus Aging +---------+---------------+---------+-----------+----------+--------------+ CFV      Full           Yes      Yes                                 +---------+---------------+---------+-----------+----------+--------------+ SFJ      Full                                                        +---------+---------------+---------+-----------+----------+--------------+ FV Prox  Full                                                        +---------+---------------+---------+-----------+----------+--------------+ FV Mid   Full                                                         +---------+---------------+---------+-----------+----------+--------------+ FV DistalFull                                                        +---------+---------------+---------+-----------+----------+--------------+ PFV      Full                                                        +---------+---------------+---------+-----------+----------+--------------+ POP      Full           Yes      Yes                                 +---------+---------------+---------+-----------+----------+--------------+ PTV      Full                                                        +---------+---------------+---------+-----------+----------+--------------+  PERO     Full                                                        +---------+---------------+---------+-----------+----------+--------------+     Summary: BILATERAL: - No evidence of deep vein thrombosis seen in the lower extremities, bilaterally. - No evidence of superficial venous thrombosis in the lower extremities, bilaterally. -   *See table(s) above for measurements and observations. Electronically signed by Gretta Began MD on 08/28/2020 at 4:27:49 PM.    Final      Labs:   Basic Metabolic Panel: Recent Labs  Lab 08/27/20 1430 08/27/20 1430 08/28/20 0450 08/28/20 0450 08/29/20 0257 08/30/20 0044  NA 141  --  135  --  136 137  K 4.9   < > 4.2   < > 3.9 3.9  CL 108  --  104  --  105 106  CO2 18*  --  21*  --  22 22  GLUCOSE 128*  --  114*  --  101* 103*  BUN 17  --  16  --  13 11  CREATININE 0.98  --  0.91  --  0.81 0.92  CALCIUM 8.8*  --  8.5*  --  8.5* 8.7*  MG 1.6*  --  2.6*  --   --  2.0  PHOS  --   --   --   --   --  2.4*   < > = values in this interval not displayed.   GFR Estimated Creatinine Clearance: 97.1 mL/min (by C-G formula based on SCr of 0.92 mg/dL). Liver Function Tests: No results for input(s): AST, ALT, ALKPHOS, BILITOT, PROT, ALBUMIN in the last 168 hours. No results for input(s):  LIPASE, AMYLASE in the last 168 hours. No results for input(s): AMMONIA in the last 168 hours. Coagulation profile No results for input(s): INR, PROTIME in the last 168 hours.  CBC: Recent Labs  Lab 08/27/20 1430 08/28/20 0450 08/29/20 0257 08/30/20 0044  WBC 16.7* 12.7* 8.6 9.2  NEUTROABS 14.1*  --   --   --   HGB 14.9 13.3 12.0* 12.6*  HCT 45.6 40.9 37.1* 38.1*  MCV 102.0* 102.5* 101.4* 99.5  PLT 162 138* 142* 166   Cardiac Enzymes: No results for input(s): CKTOTAL, CKMB, CKMBINDEX, TROPONINI in the last 168 hours. BNP: Invalid input(s): POCBNP CBG: No results for input(s): GLUCAP in the last 168 hours. D-Dimer No results for input(s): DDIMER in the last 72 hours. Hgb A1c No results for input(s): HGBA1C in the last 72 hours. Lipid Profile No results for input(s): CHOL, HDL, LDLCALC, TRIG, CHOLHDL, LDLDIRECT in the last 72 hours. Thyroid function studies Recent Labs    08/27/20 1430  TSH 0.457   Anemia work up No results for input(s): VITAMINB12, FOLATE, FERRITIN, TIBC, IRON, RETICCTPCT in the last 72 hours. Microbiology Recent Results (from the past 240 hour(s))  Respiratory Panel by RT PCR (Flu A&B, Covid) - Nasopharyngeal Swab     Status: None   Collection Time: 08/27/20  9:51 PM   Specimen: Nasopharyngeal Swab  Result Value Ref Range Status   SARS Coronavirus 2 by RT PCR NEGATIVE NEGATIVE Final    Comment: (NOTE) SARS-CoV-2 target nucleic acids are NOT DETECTED.  The SARS-CoV-2 RNA is generally detectable in upper respiratoy specimens during the acute phase  of infection. The lowest concentration of SARS-CoV-2 viral copies this assay can detect is 131 copies/mL. A negative result does not preclude SARS-Cov-2 infection and should not be used as the sole basis for treatment or other patient management decisions. A negative result may occur with  improper specimen collection/handling, submission of specimen other than nasopharyngeal swab, presence of viral  mutation(s) within the areas targeted by this assay, and inadequate number of viral copies (<131 copies/mL). A negative result must be combined with clinical observations, patient history, and epidemiological information. The expected result is Negative.  Fact Sheet for Patients:  https://www.moore.com/  Fact Sheet for Healthcare Providers:  https://www.young.biz/  This test is no t yet approved or cleared by the [place] FDA and  has been authorized for detection and/or diagnosis of SARS-CoV-2 by FDA under an Emergency Use Authorization (EUA). This EUA will remain  in effect (meaning this test can be used) for the duration of the COVID-19 declaration under Section 564(b)(1) of the Act, 21 U.S.C. section 360bbb-3(b)(1), unless the authorization is terminated or revoked sooner.     Influenza A by PCR NEGATIVE NEGATIVE Final   Influenza B by PCR NEGATIVE NEGATIVE Final    Comment: (NOTE) The Xpert Xpress SARS-CoV-2/FLU/RSV assay is intended as an aid in  the diagnosis of influenza from Nasopharyngeal swab specimens and  should not be used as a sole basis for treatment. Nasal washings and  aspirates are unacceptable for Xpert Xpress SARS-CoV-2/FLU/RSV  testing.  Fact Sheet for Patients: https://www.moore.com/  Fact Sheet for Healthcare Providers: https://www.young.biz/  This test is not yet approved or cleared by the [place] FDA and  has been authorized for detection and/or diagnosis of SARS-CoV-2 by  FDA under an Emergency Use Authorization (EUA). This EUA will remain  in effect (meaning this test can be used) for the duration of the  Covid-19 declaration under Section 564(b)(1) of the Act, 21  U.S.C. section 360bbb-3(b)(1), unless the authorization is  terminated or revoked. Performed at [facility], [address], [place], [place] [address]      Signed: Lorin Glass  [facility] 08/30/2020, 2:29 PM

## 2020-08-30 NOTE — Progress Notes (Signed)
Subjective:  Complains of neck spasm as he slept wrong.  Otherwise no chest pain, no dyspnea.  Intake/Output from previous day:  I/O last 3 completed shifts: In: 1330.6 [P.O.:240; I.V.:1090.6] Out: -  No intake/output data recorded.  Blood pressure 132/80, pulse 89, temperature 99.9 F (37.7 C), temperature source Oral, resp. rate 19, height 5\' 10"  (1.778 m), weight 102.1 kg, SpO2 96 %.  Vitals with BMI 08/30/2020 08/29/2020 08/29/2020  Height - - -  Weight - - -  BMI - - -  Systolic 132 132 08/31/2020  Diastolic 80 81 82  Pulse 89 86 85     Physical Exam Constitutional:      Appearance: He is obese.  Eyes:     Extraocular Movements: Extraocular movements intact.  Cardiovascular:     Rate and Rhythm: Tachycardia present. Rhythm irregular.     Pulses: Normal pulses and intact distal pulses.     Heart sounds: Normal heart sounds. No murmur heard.  No gallop.      Comments: 1-2+ bilateral pitting leg edema, no JVD. Pulmonary:     Effort: Pulmonary effort is normal.     Breath sounds: Normal breath sounds.  Abdominal:     General: Bowel sounds are normal.     Palpations: Abdomen is soft.  Musculoskeletal:        General: Normal range of motion.     Cervical back: Normal range of motion and neck supple.  Neurological:     General: No focal deficit present.     Mental Status: He is alert and oriented to person, place, and time.     Lab Results: BMP BNP (last 3 results) No results for input(s): BNP in the last 8760 hours.  ProBNP (last 3 results) No results for input(s): PROBNP in the last 8760 hours. BMP Latest Ref Rng & Units 08/30/2020 08/29/2020 08/28/2020  Glucose 70 - 99 mg/dL 08/30/2020) 254(Y) 706(C)  BUN 8 - 23 mg/dL 11 13 16   Creatinine 0.61 - 1.24 mg/dL 376(E 8.31  Sodium 135 - 145 mmol/L 137 136 135  Potassium 3.5 - 5.1 mmol/L 3.9 3.9 4.2  Chloride 98 - 111 mmol/L 106 105 104  CO2 22 - 32 mmol/L 22 22 21(L)  Calcium 8.9 - 10.3 mg/dL 5.17) 6.16) 0.7(P)    No flowsheet data found. CBC Latest Ref Rng & Units 08/30/2020 08/29/2020 08/28/2020  WBC 4.0 - 10.5 K/uL 9.2 8.6 12.7(H)  Hemoglobin 13.0 - 17.0 g/dL 12.6(L) 12.0(L) 13.3  Hematocrit 39 - 52 % 38.1(L) 37.1(L) 40.9  Platelets 150 - 400 K/uL 166 142(L) 138(L)     Ref Range & Units 1 d ago  (08/27/20) 1 d ago  (08/27/20)  Troponin I (High Sensitivity) <18 ng/L 31High  39High CM        HEMOGLOBIN A1C No results found for: HGBA1C, MPG TSH Recent Labs    08/27/20 1430  TSH 0.457   Imaging:  Lower Extremity Venous Duplex  08/28/20   +---------+---------------+---------+-----------+----------+--------------+     Summary: BILATERAL: - No evidence of deep vein thrombosis seen in the lower extremities, bilaterally. - No evidence of superficial venous thrombosis in the lower extremities, bilaterally. -   *See table(s) above for measurements and observations.    Preliminary    CT Angio Chest PE W and/or Wo Contrast.  Date: 08/27/2020 CLINICAL DATA:  Shortness of breath and chest pain  FINDINGS: Cardiovascular: Satisfactory opacification of the pulmonary arteries to the segmental level. There are filling defects in segmental branches  of bilateral upper and right middle lobes. Additional suspected filling defects within subsegmental branches. Left atrial enlargement. No evidence of right heart strain. No pericardial effusion. Mediastinum/Nodes: There are no enlarged lymph nodes identified. Thyroid and esophagus are unremarkable. Lungs/Pleura: Patchy areas of atelectasis. No pleural effusion or pneumothorax. Upper Abdomen: No acute abnormality. Musculoskeletal: No acute osseous abnormality. Review of the MIP images confirms the above findings.  IMPRESSION: Acute segmental and subsegmental pulmonary emboli bilaterally. No evidence of right heart strain. Patchy bilateral atelectasis.  Cardiac Studies:  EKG 08/28/2020: Sinus tachycardia at rate of 100 bpm, left axis deviation, left  intrafascicular block.  Early R wave transition in V2, probably lead placement but cannot exclude RVH.  Nonspecific T abnormality.  Compared to EKG done 2020, diffuse inferior and lateral T wave inversions suggestive of ischemia are no longer present.  No change in V2 R wave progression.  Abnormal EKG.  Echocardiogram 08/28/2020:  1. Left ventricular ejection fraction, by estimation, is 50 to 55%. The left ventricle has low normal function. The left ventricle has no regional wall motion abnormalities. Left ventricular diastolic parameters were normal.  2. Right ventricular systolic function is normal. The right ventricular size is normal. There is moderately elevated pulmonary artery systolic pressure. The estimated right ventricular systolic pressure is 40.6 mmHg.  3. The mitral valve is normal in structure. Trivial mitral valve regurgitation. No evidence of mitral stenosis.  4. The aortic valve is normal in structure. Aortic valve regurgitation is not visualized.  5. The inferior vena cava is dilated in size with <50% respiratory variability, suggesting right atrial pressure of 15 mmHg.  Scheduled Meds: . apixaban  10 mg Oral BID   Followed by  . [START ON 09/04/2020] apixaban  5 mg Oral BID  . atorvastatin  20 mg Oral Daily  . diltiazem  300 mg Oral Daily  . dronedarone  400 mg Oral BID WC  . metoprolol tartrate  25 mg Oral BID  . sodium chloride flush  3 mL Intravenous Q12H   Continuous Infusions:  PRN Meds:.acetaminophen **OR** acetaminophen, HYDROcodone-acetaminophen, levalbuterol, morphine injection, ondansetron **OR** ondansetron (ZOFRAN) IV, polyethylene glycol  Assessment/Plan:  1.  Typical atrial flutter with RVR SP successful cardioversion yesterday on 08/27/2020, now patient is in atrial fibrillation with rapid ventricular response. 2.  Acute bilateral pulmonary embolus with negative DVT scan, spontaneous PE.  Needs hypercoagulable work-up. 3.  Hypertension 4.   Hyperlipidemia 5.  Tobacco use disorder, smokes cigars 1-2 times a week  Recommendation: Complete abstinence from tobacco.  We will increase metoprolol tartrate to 50 mg p.o. twice daily, continue Multitaq 40 mg p.o. twice daily along with diltiazem CD 300 mg daily.  Hopefully his heart rate will be improved.  Could consider direct-current cardioversion in 2 to 3 weeks once he is fully anticoagulated from pulmonary embolism standpoint.  Blood pressure is not well controlled and he is appropriately on statin therapy.  Expect heart rate improved in the next few hours to a day, if patient remains stable can be discharged home and I can see him back in the office.  With regard to neck spasm, muscle relaxant, baclofen dose order placed.   Yates Decamp, MD, [facility] 08/30/2020, 6:51 AM Office: [phone] Pager: [phone]

## 2020-08-31 LAB — FACTOR 5 LEIDEN

## 2020-08-31 NOTE — Progress Notes (Deleted)
Primary Physician/Referring:  Roger Kill, PA-C  Patient ID: Carl [name], male    DOB: Aug 12, 1956, 64 y.o.   MRN: [id number]  No chief complaint on file.  HPI:    Carl [name]  is a 64 y.o. male with past medical history of hypertension, hyperlipidemia, tobacco use disorder, who presented to the emergency department 08/27/2020 in typical atrial flutter with RVR.  He underwent successful direct-current cardioversion, and now presents for follow-up.  ***  Past Medical History:  Diagnosis Date  . Hypercholesteremia   . Primary hypertension   . Pulmonary embolism (HCC) 08/27/2020  . Typical atrial flutter (HCC) 08/27/2020 with PE   Past Surgical History:  Procedure Laterality Date  . LAPAROTOMY     Family History  Problem Relation Age of Onset  . CAD Mother 40    Social History   Tobacco Use  . Smoking status: Current Every Day Smoker    Types: Cigars  . Smokeless tobacco: Never Used  . Tobacco comment: Once a week  Substance Use Topics  . Alcohol use: Yes    Alcohol/week: 1.0 standard drink    Types: 1 Glasses of wine per week    Comment: Occasional   Marital Status: Widowed   ROS  ***ROS  Objective  There were no vitals taken for this visit.  Vitals with BMI 08/30/2020 08/30/2020 08/29/2020  Height - - -  Weight - - -  BMI - - -  Systolic - 132 132  Diastolic - 80 81  Pulse 140 89 86      ***Physical Exam  Laboratory examination:   Recent Labs    08/28/20 0450 08/29/20 0257 08/30/20 0044  NA 135 136 137  K 4.2 3.9 3.9  CL 104 105 106  CO2 21* 22 22  GLUCOSE 114* 101* 103*  BUN 16 13 11   CREATININE 0.91 0.81 0.92  CALCIUM 8.5* 8.5* 8.7*  GFRNONAA >60 >60 >60   estimated creatinine clearance is 97.1 mL/min (by C-G formula based on SCr of 0.92 mg/dL).  CMP Latest Ref Rng & Units 08/30/2020 08/29/2020 08/28/2020  Glucose 70 - 99 mg/dL 08/30/2020) 326(Z) 124(P)  BUN 8 - 23 mg/dL 11 13 16   Creatinine 0.61 - 1.24 mg/dL 809(X 8.33    Sodium 135 - 145 mmol/L 137 136 135  Potassium 3.5 - 5.1 mmol/L 3.9 3.9 4.2  Chloride 98 - 111 mmol/L 106 105 104  CO2 22 - 32 mmol/L 22 22 21(L)  Calcium 8.9 - 10.3 mg/dL 8.25) 0.53) 9.7(Q)   CBC Latest Ref Rng & Units 08/30/2020 08/29/2020 08/28/2020  WBC 4.0 - 10.5 K/uL 9.2 8.6 12.7(H)  Hemoglobin 13.0 - 17.0 g/dL 12.6(L) 12.0(L) 13.3  Hematocrit 39 - 52 % 38.1(L) 37.1(L) 40.9  Platelets 150 - 400 K/uL 166 142(L) 138(L)    Lipid Panel No results for input(s): CHOL, TRIG, LDLCALC, VLDL, HDL, CHOLHDL, LDLDIRECT in the last 8760 hours.  HEMOGLOBIN A1C No results found for: HGBA1C, MPG TSH Recent Labs    08/27/20 1430  TSH 0.457    External labs:  02/17/2020 LDL 145, total cholesterol 229, triglycerides 136, HDL 62, non-HDL 167  Medications and allergies  No Known Allergies   Outpatient Medications Prior to Visit  Medication Sig Dispense Refill  . allopurinol (ZYLOPRIM) 100 MG tablet Take 100 mg by mouth in the morning.     08/29/20 apixaban (ELIQUIS) 5 MG TABS tablet 10 mg twice daily for 7 days followed by 5 mg twice daily for long-term  72 tablet 0  . ascorbic acid (VITAMIN C) 500 MG tablet Take 500 mg by mouth 3 (three) times a week.    Marland Kitchen atorvastatin (LIPITOR) 10 MG tablet Take 10 mg by mouth daily.    . Cyanocobalamin ([employer] B12 GUMMIES PO) Take 1-2 tablets by mouth See admin instructions. Chew 1-2 gummies by mouth in the morning    . diltiazem (CARDIZEM CD) 300 MG 24 hr capsule Take 1 capsule (300 mg total) by mouth daily. 30 capsule 0  . dronedarone (MULTAQ) 400 MG tablet Take 1 tablet (400 mg total) by mouth 2 (two) times daily with a meal. 60 tablet 0  . Lactobacillus Rhamnosus, GG, (RA PROBIOTIC DIGESTIVE CARE) CAPS Take 1 capsule by mouth in the morning.    . levalbuterol (XOPENEX HFA) 45 MCG/ACT inhaler Inhale 1 puff into the lungs every 4 (four) hours as needed for wheezing. 1 each 2  . metoprolol tartrate (LOPRESSOR) 50 MG tablet Take 1 tablet (50 mg total) by mouth 2  (two) times daily. 60 tablet 0  . tadalafil (CIALIS) 20 MG tablet Take 20 mg by mouth daily as needed for erectile dysfunction.      No facility-administered medications prior to visit.     Radiology:   No results found.  Cardiac Studies:   Lower Extremity Venous Duplex  08/28/20   +---------+---------------+---------+-----------+----------+--------------+     Summary: BILATERAL: - No evidence of deep vein thrombosis seen in the lower extremities, bilaterally. - No evidence of superficial venous thrombosis in the lower extremities, bilaterally. -   *See table(s) above for measurements and observations.    Preliminary    CT Angio Chest PE W and/or Wo Contrast.  Date: 08/27/2020 CLINICAL DATA:  Shortness of breath and chest pain  FINDINGS: Cardiovascular: Satisfactory opacification of the pulmonary arteries to the segmental level. There are filling defects in segmental branches of bilateral upper and right middle lobes. Additional suspected filling defects within subsegmental branches. Left atrial enlargement. No evidence of right heart strain. No pericardial effusion. Mediastinum/Nodes: There are no enlarged lymph nodes identified. Thyroid and esophagus are unremarkable. Lungs/Pleura: Patchy areas of atelectasis. No pleural effusion or pneumothorax. Upper Abdomen: No acute abnormality. Musculoskeletal: No acute osseous abnormality. Review of the MIP images confirms the above findings.  IMPRESSION: Acute segmental and subsegmental pulmonary emboli bilaterally. No evidence of right heart strain. Patchy bilateral atelectasis.  Echocardiogram 08/28/2020:  1. Left ventricular ejection fraction, by estimation, is 50 to 55%. The left ventricle has low normal function. The left ventricle has no regional wall motion abnormalities. Left ventricular diastolic parameters were normal.  2. Right ventricular systolic function is normal. The right ventricular size is normal. There is moderately elevated  pulmonary artery systolic pressure. The estimated right ventricular systolic pressure is 40.6 mmHg.  3. The mitral valve is normal in structure. Trivial mitral valve regurgitation. No evidence of mitral stenosis.  4. The aortic valve is normal in structure. Aortic valve regurgitation is not visualized.  5. The inferior vena cava is dilated in size with <50% respiratory variability, suggesting right atrial pressure of 15 mmHg.  EKG:   EKG 08/28/2020: Sinus tachycardia at rate of 100 bpm, left axis deviation, left intrafascicular block.  Early R wave transition in V2, probably lead placement but cannot exclude RVH.  Nonspecific T abnormality.  Compared to EKG done 2020, diffuse inferior and lateral T wave inversions suggestive of ischemia are no longer present.  No change in V2 R wave progression.  Abnormal EKG.    ***  Assessment     ICD-10-CM   1. Paroxysmal atrial fibrillation (HCC)  I48.0   2. Essential hypertension  I10   3. Hypercholesteremia  E78.00   4. Tobacco use disorder  F17.200      There are no discontinued medications.  No orders of the defined types were placed in this encounter.   Recommendations:   Carl [name] is a 64 y.o.  Male with history of hypertension, hyperlipidemia, tobacco use disorder, diagnosed with paroxysmal atrial fibrillation 08/27/2020 when he presented to the emergency department 08/27/2020 in typical atrial flutter with RVR.   ***Stress test and echo. Taking metoprol 50mg  twice daily, multaq 40mg  twice daily, cardizem cd 300mg  daily? Eliquis 10mg  twice daily x1week and 5mg  twice daily after.    Patient was seen in collaboration with Dr. He also reviewed patient's chart and examined the patient. Dr. is in agreement of the plan.   During this visit I reviewed and updated: Tobacco history  allergies medication reconciliation  medical history  surgical history  family history  social history.  This note was created using a voice  recognition software as a result there may be grammatical errors inadvertently enclosed that do not reflect the nature of this encounter. Every attempt is made to correct such errors.   , PA-C 09/03/2020, 8:59 AM Office: [phone]

## 2020-09-03 ENCOUNTER — Ambulatory Visit: Payer: Federal, State, Local not specified - PPO | Admitting: Student

## 2020-09-03 ENCOUNTER — Telehealth: Payer: Self-pay | Admitting: Student

## 2020-09-03 LAB — PROTHROMBIN GENE MUTATION

## 2020-09-03 NOTE — Telephone Encounter (Signed)
Patient unavailable. Left message to check in as patient missed his appointment today.

## 2020-09-05 DIAGNOSIS — R059 Cough, unspecified: Secondary | ICD-10-CM | POA: Diagnosis not present

## 2020-09-05 DIAGNOSIS — Z20822 Contact with and (suspected) exposure to covid-19: Secondary | ICD-10-CM | POA: Diagnosis not present

## 2020-09-11 DIAGNOSIS — I4891 Unspecified atrial fibrillation: Secondary | ICD-10-CM | POA: Diagnosis not present

## 2020-09-11 DIAGNOSIS — I2699 Other pulmonary embolism without acute cor pulmonale: Secondary | ICD-10-CM | POA: Diagnosis not present

## 2020-09-11 DIAGNOSIS — R0602 Shortness of breath: Secondary | ICD-10-CM | POA: Diagnosis not present

## 2020-09-11 DIAGNOSIS — R748 Abnormal levels of other serum enzymes: Secondary | ICD-10-CM | POA: Diagnosis not present

## 2020-09-11 DIAGNOSIS — R600 Localized edema: Secondary | ICD-10-CM | POA: Diagnosis not present

## 2020-09-13 ENCOUNTER — Encounter: Payer: Self-pay | Admitting: Hematology and Oncology

## 2020-09-17 ENCOUNTER — Encounter: Payer: Self-pay | Admitting: Student

## 2020-09-17 ENCOUNTER — Other Ambulatory Visit: Payer: Self-pay

## 2020-09-17 ENCOUNTER — Ambulatory Visit: Payer: Federal, State, Local not specified - PPO | Admitting: Student

## 2020-09-17 VITALS — BP 107/82 | HR 159 | Resp 17 | Ht 70.0 in | Wt 238.0 lb

## 2020-09-17 DIAGNOSIS — I483 Typical atrial flutter: Secondary | ICD-10-CM

## 2020-09-17 DIAGNOSIS — I4891 Unspecified atrial fibrillation: Secondary | ICD-10-CM

## 2020-09-17 MED ORDER — DRONEDARONE HCL 400 MG PO TABS: 400.0000 mg | ORAL_TABLET | Freq: Two times a day (BID) | ORAL | 0 refills | Status: DC

## 2020-09-17 NOTE — Progress Notes (Signed)
Primary Physician/Referring:  Roger Kill, PA-C  Patient ID: Carl [name], male    DOB: 02-Sep-1956, 64 y.o.   MRN: [id number]  Chief Complaint  Patient presents with  . Atrial Fibrillation  . Hospitalization Follow-up  . New Patient (Initial Visit)   HPI:    Carl [name]  is a 64 y.o. male with past medical history of hypertension, hyperlipidemia, tobacco use disorder, who presented to the emergency department 08/27/2020 in typical atrial flutter with RVR.  He underwent successful direct-current cardioversion, and now presents for follow-up.  Patient reports after discharge he was taking his medications for about 1 week but was not feeling well he therefore stopped all medications.  He was then seen by his PCP who urged him to restart his cardiovascular medicines, patient did so about 4 days ago with the exception of Multaq.  He has not taken Multaq since a few days post discharge from the hospital.  He reports since about 3 days after discharge he has been having increased fatigue, headache, chest pain, shortness of breath, swelling and orthopnea.  He is currently tolerating Eliquis without bleeding diathesis.    Past Medical History:  Diagnosis Date  . Hypercholesteremia   . Primary hypertension   . Pulmonary embolism (HCC) 08/27/2020  . Typical atrial flutter (HCC) 08/27/2020 with PE   Past Surgical History:  Procedure Laterality Date  . LAPAROTOMY     Family History  Problem Relation Age of Onset  . CAD Mother 37  . Heart attack Father   . Heart disease Brother   . Heart failure Brother   . Heart attack Brother     Social History   Tobacco Use  . Smoking status: Current Every Day Smoker    Types: Cigars  . Smokeless tobacco: Never Used  . Tobacco comment: Once a week  Substance Use Topics  . Alcohol use: Yes    Alcohol/week: 1.0 standard drink    Types: 1 Glasses of wine per week    Comment: Occasional   Marital Status: Widowed   ROS  Review of  Systems  Constitutional: Negative for malaise/fatigue and weight gain.  Cardiovascular: Positive for chest pain, dyspnea on exertion, leg swelling, orthopnea and palpitations. Negative for claudication, near-syncope, paroxysmal nocturnal dyspnea and syncope.  Respiratory: Positive for shortness of breath.   Hematologic/Lymphatic: Does not bruise/bleed easily.  Gastrointestinal: Negative for melena.  Neurological: Negative for dizziness and weakness.    Objective  Blood pressure 107/82, pulse (!) 159, resp. rate 17, height 5\' 10"  (1.778 m), weight 238 lb (108 kg), SpO2 94 %.  Vitals with BMI 09/17/2020 08/30/2020 08/30/2020  Height 5\' 10"  - -  Weight 238 lbs - -  BMI 34.15 - -  Systolic 107 - 132  Diastolic 82 - 80  Pulse 159 140 89      Physical Exam Vitals reviewed.  Constitutional:      Comments: Patient is short of breath while talking.   HENT:     Head: Normocephalic and atraumatic.  Cardiovascular:     Rate and Rhythm: Regular rhythm. Tachycardia present.     Pulses: Intact distal pulses.     Heart sounds: S1 normal and S2 normal. No murmur heard.  No gallop.      Comments: 2+ bilateral LE edema  Pulmonary:     Effort: Pulmonary effort is normal. No respiratory distress.     Breath sounds: No wheezing, rhonchi or rales.  Musculoskeletal:     Right lower leg: No  edema.     Left lower leg: No edema.  Neurological:     Mental Status: He is alert.    Laboratory examination:   Recent Labs    08/28/20 0450 08/29/20 0257 08/30/20 0044  NA 135 136 137  K 4.2 3.9 3.9  CL 104 105 106  CO2 21* 22 22  GLUCOSE 114* 101* 103*  BUN 16 13 11   CREATININE 0.91 0.81 0.92  CALCIUM 8.5* 8.5* 8.7*  GFRNONAA >60 >60 >60   estimated creatinine clearance is 99.8 mL/min (by C-G formula based on SCr of 0.92 mg/dL).  CMP Latest Ref Rng & Units 08/30/2020 08/29/2020 08/28/2020  Glucose 70 - 99 mg/dL 08/30/2020) 110(R) 159(Y)  BUN 8 - 23 mg/dL 11 13 16   Creatinine 0.61 - 1.24 mg/dL  585(F 2.92  Sodium 135 - 145 mmol/L 137 136 135  Potassium 3.5 - 5.1 mmol/L 3.9 3.9 4.2  Chloride 98 - 111 mmol/L 106 105 104  CO2 22 - 32 mmol/L 22 22 21(L)  Calcium 8.9 - 10.3 mg/dL 4.46) 2.86) 3.8(T)   CBC Latest Ref Rng & Units 08/30/2020 08/29/2020 08/28/2020  WBC 4.0 - 10.5 K/uL 9.2 8.6 12.7(H)  Hemoglobin 13.0 - 17.0 g/dL 12.6(L) 12.0(L) 13.3  Hematocrit 39 - 52 % 38.1(L) 37.1(L) 40.9  Platelets 150 - 400 K/uL 166 142(L) 138(L)    Lipid Panel No results for input(s): CHOL, TRIG, LDLCALC, VLDL, HDL, CHOLHDL, LDLDIRECT in the last 8760 hours.  HEMOGLOBIN A1C No results found for: HGBA1C, MPG TSH Recent Labs    08/27/20 1430  TSH 0.457    External labs:  02/17/2020 LDL 145, total cholesterol 229, triglycerides 136, HDL 62, non-HDL 167  Medications and allergies  No Known Allergies   Outpatient Medications Prior to Visit  Medication Sig Dispense Refill  . allopurinol (ZYLOPRIM) 100 MG tablet Take 100 mg by mouth in the morning.     08/29/20 apixaban (ELIQUIS) 5 MG TABS tablet 10 mg twice daily for 7 days followed by 5 mg twice daily for long-term 72 tablet 0  . atorvastatin (LIPITOR) 10 MG tablet Take 10 mg by mouth daily.    04/18/2020 diltiazem (CARDIZEM CD) 300 MG 24 hr capsule Take 1 capsule (300 mg total) by mouth daily. 30 capsule 0  . furosemide (LASIX) 20 MG tablet Take 20 mg by mouth daily.    . Lactobacillus Rhamnosus, GG, (RA PROBIOTIC DIGESTIVE CARE) CAPS Take 1 capsule by mouth in the morning.    . levalbuterol (XOPENEX HFA) 45 MCG/ACT inhaler Inhale 1 puff into the lungs every 4 (four) hours as needed for wheezing. 1 each 2  . metoprolol tartrate (LOPRESSOR) 50 MG tablet Take 1 tablet (50 mg total) by mouth 2 (two) times daily. 60 tablet 0  . tadalafil (CIALIS) 20 MG tablet Take 20 mg by mouth daily as needed for erectile dysfunction.     Marland Kitchen ascorbic acid (VITAMIN C) 500 MG tablet Take 500 mg by mouth 3 (three) times a week. (Patient not taking: Reported on 09/17/2020)     . Cyanocobalamin ([employer] B12 GUMMIES PO) Take 1-2 tablets by mouth See admin instructions. Chew 1-2 gummies by mouth in the morning (Patient not taking: Reported on 09/17/2020)    . dronedarone (MULTAQ) 400 MG tablet Take 1 tablet (400 mg total) by mouth 2 (two) times daily with a meal. 60 tablet 0   No facility-administered medications prior to visit.     Radiology:   No results found.  Cardiac Studies:  Lower Extremity Venous Duplex  08/28/20   +---------+---------------+---------+-----------+----------+--------------+     Summary: BILATERAL: - No evidence of deep vein thrombosis seen in the lower extremities, bilaterally. - No evidence of superficial venous thrombosis in the lower extremities, bilaterally. -   *See table(s) above for measurements and observations.    Preliminary    CT Angio Chest PE W and/or Wo Contrast.  Date: 08/27/2020 CLINICAL DATA:  Shortness of breath and chest pain  FINDINGS: Cardiovascular: Satisfactory opacification of the pulmonary arteries to the segmental level. There are filling defects in segmental branches of bilateral upper and right middle lobes. Additional suspected filling defects within subsegmental branches. Left atrial enlargement. No evidence of right heart strain. No pericardial effusion. Mediastinum/Nodes: There are no enlarged lymph nodes identified. Thyroid and esophagus are unremarkable. Lungs/Pleura: Patchy areas of atelectasis. No pleural effusion or pneumothorax. Upper Abdomen: No acute abnormality. Musculoskeletal: No acute osseous abnormality. Review of the MIP images confirms the above findings.  IMPRESSION: Acute segmental and subsegmental pulmonary emboli bilaterally. No evidence of right heart strain. Patchy bilateral atelectasis.  Echocardiogram 08/28/2020:  1. Left ventricular ejection fraction, by estimation, is 50 to 55%. The left ventricle has low normal function. The left ventricle has no regional wall motion abnormalities. Left  ventricular diastolic parameters were normal.  2. Right ventricular systolic function is normal. The right ventricular size is normal. There is moderately elevated pulmonary artery systolic pressure. The estimated right ventricular systolic pressure is 40.6 mmHg.  3. The mitral valve is normal in structure. Trivial mitral valve regurgitation. No evidence of mitral stenosis.  4. The aortic valve is normal in structure. Aortic valve regurgitation is not visualized.  5. The inferior vena cava is dilated in size with <50% respiratory variability, suggesting right atrial pressure of 15 mmHg.  EKG:   EKG 09/17/2020: Typical atrial flutter with 2: conduction and ventricular rate of 160 bpm.  Normal axis.  Incomplete right bundle branch block.  Nonspecific T wave abnormality.  EKG 08/28/2020: Sinus tachycardia at rate of 100 bpm, left axis deviation, left intrafascicular block.  Early R wave transition in V2, probably lead placement but cannot exclude RVH.  Nonspecific T abnormality.  Compared to EKG done 2020, diffuse inferior and lateral T wave inversions suggestive of ischemia are no longer present.  No change in V2 R wave progression.  Abnormal EKG.  Assessment     ICD-10-CM   1. Typical atrial flutter (HCC)  I48.3   2. Atrial fibrillation, unspecified type (HCC)  I48.91 EKG 12-Lead     Medications Discontinued During This Encounter  Medication Reason  . dronedarone (MULTAQ) 400 MG tablet Patient Preference    No orders of the defined types were placed in this encounter.   Recommendations:   Carl [name] is a 64 y.o.  Male with history of hypertension, hyperlipidemia, tobacco use disorder, diagnosed with paroxysmal atrial fibrillation 08/27/2020 when he presented to the emergency department 08/27/2020 in typical atrial flutter with RVR.   Patient presents today for follow-up and EKG today reveals he is back in typical atrial flutter with 2-1 conduction which is likely the etiology of  patient's symptoms.  Patient is currently taking diltiazem and metoprolol tartrate.  As his blood pressure is soft today but rate is not well controlled and he is not currently taking Multaq, will restart this at this time.    Discussed with patient regarding treatment options including reporting from the office straight to the emergency department for evaluation and heart rate control with potential  TEE cardioversion.  Discussed with patient the other option is to restart Multaq and follow-up in 4 days with repeat EKG and symptom recheck.  Patient understands risks and benefits of each option, and prefers to restart Multaq at home and have close follow-up in the office as opposed to being going to the hospital.  As patient has not been fully anticoagulated for at least 4 weeks, will hold off on scheduling for cardioversion at this time.  Discussed at length with patient regarding signs and symptoms that should warrant him calling EMS for transport to the emergency department for further evaluation and treatment.  Patient verbalized understanding and agreement.  Follow up in 3-4 days.   Patient was seen in collaboration with Dr. Odis Hollingsheadolia. He also reviewed patient's chart and Dr. Odis Hollingsheadolia is in agreement of the plan.    This was a 40-minute encounter with face-to-face counseling, medical records review, coordination of care, explanation of complex medical issues, complex medical decision making.     Rayford HalstedCeleste C Jimma Ortman, PA-C 09/17/2020, 5:02 PM Office: [phone]

## 2020-09-19 ENCOUNTER — Emergency Department (HOSPITAL_COMMUNITY): Payer: Federal, State, Local not specified - PPO

## 2020-09-19 ENCOUNTER — Telehealth: Payer: Self-pay

## 2020-09-19 ENCOUNTER — Inpatient Hospital Stay (HOSPITAL_COMMUNITY)
Admission: EM | Admit: 2020-09-19 | Discharge: 2020-09-26 | DRG: 871 | Disposition: A | Discharge status: Home / Self Care | Payer: Federal, State, Local not specified - PPO | Attending: Internal Medicine | Admitting: Internal Medicine

## 2020-09-19 ENCOUNTER — Other Ambulatory Visit: Payer: Self-pay

## 2020-09-19 DIAGNOSIS — I2699 Other pulmonary embolism without acute cor pulmonale: Secondary | ICD-10-CM | POA: Diagnosis not present

## 2020-09-19 DIAGNOSIS — R0600 Dyspnea, unspecified: Secondary | ICD-10-CM

## 2020-09-19 DIAGNOSIS — I5033 Acute on chronic diastolic (congestive) heart failure: Secondary | ICD-10-CM

## 2020-09-19 DIAGNOSIS — M109 Gout, unspecified: Secondary | ICD-10-CM | POA: Diagnosis not present

## 2020-09-19 DIAGNOSIS — Y929 Unspecified place or not applicable: Secondary | ICD-10-CM

## 2020-09-19 DIAGNOSIS — E785 Hyperlipidemia, unspecified: Secondary | ICD-10-CM | POA: Diagnosis not present

## 2020-09-19 DIAGNOSIS — Z8249 Family history of ischemic heart disease and other diseases of the circulatory system: Secondary | ICD-10-CM | POA: Diagnosis not present

## 2020-09-19 DIAGNOSIS — F17290 Nicotine dependence, other tobacco product, uncomplicated: Secondary | ICD-10-CM | POA: Diagnosis present

## 2020-09-19 DIAGNOSIS — I480 Paroxysmal atrial fibrillation: Secondary | ICD-10-CM | POA: Diagnosis present

## 2020-09-19 DIAGNOSIS — J9601 Acute respiratory failure with hypoxia: Secondary | ICD-10-CM | POA: Diagnosis not present

## 2020-09-19 DIAGNOSIS — I071 Rheumatic tricuspid insufficiency: Secondary | ICD-10-CM | POA: Diagnosis present

## 2020-09-19 DIAGNOSIS — I10 Essential (primary) hypertension: Secondary | ICD-10-CM | POA: Diagnosis present

## 2020-09-19 DIAGNOSIS — J9811 Atelectasis: Secondary | ICD-10-CM | POA: Diagnosis not present

## 2020-09-19 DIAGNOSIS — E7800 Pure hypercholesterolemia, unspecified: Secondary | ICD-10-CM | POA: Diagnosis present

## 2020-09-19 DIAGNOSIS — J90 Pleural effusion, not elsewhere classified: Secondary | ICD-10-CM | POA: Diagnosis not present

## 2020-09-19 DIAGNOSIS — Z7901 Long term (current) use of anticoagulants: Secondary | ICD-10-CM

## 2020-09-19 DIAGNOSIS — Z86711 Personal history of pulmonary embolism: Secondary | ICD-10-CM | POA: Diagnosis not present

## 2020-09-19 DIAGNOSIS — I4892 Unspecified atrial flutter: Secondary | ICD-10-CM

## 2020-09-19 DIAGNOSIS — I444 Left anterior fascicular block: Secondary | ICD-10-CM | POA: Diagnosis present

## 2020-09-19 DIAGNOSIS — I5023 Acute on chronic systolic (congestive) heart failure: Secondary | ICD-10-CM | POA: Insufficient documentation

## 2020-09-19 DIAGNOSIS — T447X6A Underdosing of beta-adrenoreceptor antagonists, initial encounter: Secondary | ICD-10-CM | POA: Diagnosis present

## 2020-09-19 DIAGNOSIS — I483 Typical atrial flutter: Secondary | ICD-10-CM | POA: Diagnosis present

## 2020-09-19 DIAGNOSIS — Z20822 Contact with and (suspected) exposure to covid-19: Secondary | ICD-10-CM | POA: Diagnosis present

## 2020-09-19 DIAGNOSIS — I2510 Atherosclerotic heart disease of native coronary artery without angina pectoris: Secondary | ICD-10-CM | POA: Diagnosis present

## 2020-09-19 DIAGNOSIS — J948 Other specified pleural conditions: Secondary | ICD-10-CM | POA: Diagnosis not present

## 2020-09-19 DIAGNOSIS — I499 Cardiac arrhythmia, unspecified: Secondary | ICD-10-CM | POA: Diagnosis not present

## 2020-09-19 DIAGNOSIS — I313 Pericardial effusion (noninflammatory): Secondary | ICD-10-CM | POA: Diagnosis not present

## 2020-09-19 DIAGNOSIS — A419 Sepsis, unspecified organism: Principal | ICD-10-CM

## 2020-09-19 DIAGNOSIS — E66811 Obesity, class 1: Secondary | ICD-10-CM

## 2020-09-19 DIAGNOSIS — F1010 Alcohol abuse, uncomplicated: Secondary | ICD-10-CM

## 2020-09-19 DIAGNOSIS — Z6834 Body mass index (BMI) 34.0-34.9, adult: Secondary | ICD-10-CM

## 2020-09-19 DIAGNOSIS — N390 Urinary tract infection, site not specified: Secondary | ICD-10-CM

## 2020-09-19 DIAGNOSIS — Z79899 Other long term (current) drug therapy: Secondary | ICD-10-CM | POA: Diagnosis not present

## 2020-09-19 DIAGNOSIS — E876 Hypokalemia: Secondary | ICD-10-CM | POA: Diagnosis not present

## 2020-09-19 DIAGNOSIS — Z91128 Patient's intentional underdosing of medication regimen for other reason: Secondary | ICD-10-CM

## 2020-09-19 DIAGNOSIS — Z720 Tobacco use: Secondary | ICD-10-CM

## 2020-09-19 DIAGNOSIS — I110 Hypertensive heart disease with heart failure: Secondary | ICD-10-CM | POA: Diagnosis present

## 2020-09-19 DIAGNOSIS — I5021 Acute systolic (congestive) heart failure: Secondary | ICD-10-CM | POA: Insufficient documentation

## 2020-09-19 DIAGNOSIS — I5043 Acute on chronic combined systolic (congestive) and diastolic (congestive) heart failure: Secondary | ICD-10-CM | POA: Diagnosis present

## 2020-09-19 DIAGNOSIS — T461X6A Underdosing of calcium-channel blockers, initial encounter: Secondary | ICD-10-CM | POA: Diagnosis present

## 2020-09-19 DIAGNOSIS — I4891 Unspecified atrial fibrillation: Secondary | ICD-10-CM | POA: Diagnosis not present

## 2020-09-19 DIAGNOSIS — J189 Pneumonia, unspecified organism: Secondary | ICD-10-CM

## 2020-09-19 DIAGNOSIS — I3139 Other pericardial effusion (noninflammatory): Secondary | ICD-10-CM

## 2020-09-19 DIAGNOSIS — G4700 Insomnia, unspecified: Secondary | ICD-10-CM | POA: Diagnosis present

## 2020-09-19 DIAGNOSIS — R188 Other ascites: Secondary | ICD-10-CM | POA: Diagnosis not present

## 2020-09-19 DIAGNOSIS — R0902 Hypoxemia: Secondary | ICD-10-CM | POA: Diagnosis not present

## 2020-09-19 DIAGNOSIS — E669 Obesity, unspecified: Secondary | ICD-10-CM | POA: Diagnosis not present

## 2020-09-19 LAB — CBC WITH DIFFERENTIAL/PLATELET
Abs Immature Granulocytes: 0.09 10*3/uL — ABNORMAL HIGH (ref 0.00–0.07)
Basophils Absolute: 0.1 10*3/uL (ref 0.0–0.1)
Basophils Relative: 0 %
Eosinophils Absolute: 0 10*3/uL (ref 0.0–0.5)
Eosinophils Relative: 0 %
HCT: 39.8 % (ref 39.0–52.0)
Hemoglobin: 12.4 g/dL — ABNORMAL LOW (ref 13.0–17.0)
Immature Granulocytes: 1 %
Lymphocytes Relative: 4 %
Lymphs Abs: 0.7 10*3/uL (ref 0.7–4.0)
MCH: 32 pg (ref 26.0–34.0)
MCHC: 31.2 g/dL (ref 30.0–36.0)
MCV: 102.6 fL — ABNORMAL HIGH (ref 80.0–100.0)
Monocytes Absolute: 1.2 10*3/uL — ABNORMAL HIGH (ref 0.1–1.0)
Monocytes Relative: 7 %
Neutro Abs: 15 10*3/uL — ABNORMAL HIGH (ref 1.7–7.7)
Neutrophils Relative %: 88 %
Platelets: 366 10*3/uL (ref 150–400)
RBC: 3.88 MIL/uL — ABNORMAL LOW (ref 4.22–5.81)
RDW: 12.9 % (ref 11.5–15.5)
WBC: 17 10*3/uL — ABNORMAL HIGH (ref 4.0–10.5)
nRBC: 0 % (ref 0.0–0.2)

## 2020-09-19 LAB — COMPREHENSIVE METABOLIC PANEL
ALT: 27 U/L (ref 0–44)
AST: 15 U/L (ref 15–41)
Albumin: 2.3 g/dL — ABNORMAL LOW (ref 3.5–5.0)
Alkaline Phosphatase: 92 U/L (ref 38–126)
Anion gap: 10 (ref 5–15)
BUN: 10 mg/dL (ref 8–23)
CO2: 25 mmol/L (ref 22–32)
Calcium: 8.6 mg/dL — ABNORMAL LOW (ref 8.9–10.3)
Chloride: 104 mmol/L (ref 98–111)
Creatinine, Ser: 0.82 mg/dL (ref 0.61–1.24)
GFR, Estimated: >60 mL/min (ref >60)
Glucose, Bld: 112 mg/dL — ABNORMAL HIGH (ref 70–99)
Potassium: 4.2 mmol/L (ref 3.5–5.1)
Sodium: 139 mmol/L (ref 135–145)
Total Bilirubin: 0.7 mg/dL (ref 0.3–1.2)
Total Protein: 6 g/dL — ABNORMAL LOW (ref 6.5–8.1)

## 2020-09-19 LAB — URINALYSIS, ROUTINE W REFLEX MICROSCOPIC
Bacteria, UA: NONE SEEN
Bilirubin Urine: NEGATIVE
Glucose, UA: NEGATIVE mg/dL
Ketones, ur: NEGATIVE mg/dL
Leukocytes,Ua: NEGATIVE
Nitrite: NEGATIVE
Protein, ur: NEGATIVE mg/dL
Specific Gravity, Urine: 1.017 (ref 1.005–1.030)
pH: 5 (ref 5.0–8.0)

## 2020-09-19 LAB — APTT
aPTT: 31 seconds (ref 24–36)
aPTT: 48 seconds — ABNORMAL HIGH (ref 24–36)

## 2020-09-19 LAB — PROTIME-INR
INR: 1.4 — ABNORMAL HIGH (ref 0.8–1.2)
Prothrombin Time: 16.8 seconds — ABNORMAL HIGH (ref 11.4–15.2)

## 2020-09-19 LAB — RESPIRATORY PANEL BY RT PCR (FLU A&B, COVID)
Influenza A by PCR: NEGATIVE
Influenza B by PCR: NEGATIVE
SARS Coronavirus 2 by RT PCR: NEGATIVE

## 2020-09-19 LAB — TROPONIN I (HIGH SENSITIVITY)
Troponin I (High Sensitivity): 5 ng/L (ref <18)
Troponin I (High Sensitivity): 4 ng/L (ref <18)

## 2020-09-19 LAB — HEPARIN LEVEL (UNFRACTIONATED): Heparin Unfractionated: 0.53 IU/mL (ref 0.30–0.70)

## 2020-09-19 LAB — BRAIN NATRIURETIC PEPTIDE: B Natriuretic Peptide: 149.7 pg/mL — ABNORMAL HIGH (ref 0.0–100.0)

## 2020-09-19 LAB — LACTIC ACID, PLASMA: Lactic Acid, Venous: 1.1 mmol/L (ref 0.5–1.9)

## 2020-09-19 MED ORDER — ATORVASTATIN CALCIUM 10 MG PO TABS
10.0000 mg | ORAL_TABLET | Freq: Every day | ORAL | Status: DC
  Administered 2020-09-19 – 2020-09-26 (×8): 10 mg via ORAL
  Filled 2020-09-19 (×8): qty 1

## 2020-09-19 MED ORDER — LEVALBUTEROL HCL 0.63 MG/3ML IN NEBU: 0.6300 mg | INHALATION_SOLUTION | RESPIRATORY_TRACT | Status: DC | PRN

## 2020-09-19 MED ORDER — AMIODARONE HCL IN DEXTROSE 360-4.14 MG/200ML-% IV SOLN
60.0000 mg/h | INTRAVENOUS | Status: AC
  Administered 2020-09-19: 60 mg/h via INTRAVENOUS
  Filled 2020-09-19 (×2): qty 200

## 2020-09-19 MED ORDER — SODIUM CHLORIDE 0.9 % IV SOLN
500.0000 mg | Freq: Once | INTRAVENOUS | Status: AC
  Administered 2020-09-19: 500 mg via INTRAVENOUS
  Filled 2020-09-19: qty 500

## 2020-09-19 MED ORDER — ALLOPURINOL 100 MG PO TABS
100.0000 mg | ORAL_TABLET | Freq: Every morning | ORAL | Status: DC
  Administered 2020-09-20 – 2020-09-26 (×7): 100 mg via ORAL
  Filled 2020-09-19 (×8): qty 1

## 2020-09-19 MED ORDER — AMIODARONE HCL IN DEXTROSE 360-4.14 MG/200ML-% IV SOLN
30.0000 mg/h | INTRAVENOUS | Status: DC
  Administered 2020-09-19 – 2020-09-20 (×3): 30 mg/h via INTRAVENOUS
  Filled 2020-09-19 (×2): qty 200

## 2020-09-19 MED ORDER — SODIUM CHLORIDE 0.9 % IV SOLN
1.0000 g | INTRAVENOUS | Status: DC
  Administered 2020-09-20: 1 g via INTRAVENOUS
  Filled 2020-09-19: qty 10

## 2020-09-19 MED ORDER — LEVALBUTEROL TARTRATE 45 MCG/ACT IN AERO: 1.0000 | INHALATION_SPRAY | RESPIRATORY_TRACT | Status: DC | PRN

## 2020-09-19 MED ORDER — DILTIAZEM HCL-DEXTROSE 125-5 MG/125ML-% IV SOLN (PREMIX)
5.0000 mg/h | INTRAVENOUS | Status: DC
  Administered 2020-09-19: 15 mg/h via INTRAVENOUS
  Administered 2020-09-19: 5 mg/h via INTRAVENOUS
  Administered 2020-09-20 (×2): 15 mg/h via INTRAVENOUS
  Administered 2020-09-21 – 2020-09-22 (×2): 5 mg/h via INTRAVENOUS
  Filled 2020-09-19 (×7): qty 125

## 2020-09-19 MED ORDER — APIXABAN 5 MG PO TABS: 5.0000 mg | ORAL_TABLET | Freq: Two times a day (BID) | ORAL | Status: DC

## 2020-09-19 MED ORDER — HEPARIN (PORCINE) 25000 UT/250ML-% IV SOLN
2150.0000 [IU]/h | INTRAVENOUS | Status: AC
  Administered 2020-09-19: 1550 [IU]/h via INTRAVENOUS
  Administered 2020-09-20 (×2): 1850 [IU]/h via INTRAVENOUS
  Administered 2020-09-20: 2000 [IU]/h via INTRAVENOUS
  Filled 2020-09-19 (×3): qty 250

## 2020-09-19 MED ORDER — HEPARIN BOLUS VIA INFUSION
2500.0000 [IU] | Freq: Once | INTRAVENOUS | Status: AC
  Administered 2020-09-19: 2500 [IU] via INTRAVENOUS
  Filled 2020-09-19: qty 2500

## 2020-09-19 MED ORDER — DILTIAZEM HCL 25 MG/5ML IV SOLN
20.0000 mg | Freq: Once | INTRAVENOUS | Status: AC
  Administered 2020-09-19: 20 mg via INTRAVENOUS
  Filled 2020-09-19: qty 5

## 2020-09-19 MED ORDER — OXYCODONE-ACETAMINOPHEN 5-325 MG PO TABS
1.0000 | ORAL_TABLET | ORAL | Status: DC | PRN
  Administered 2020-09-19 – 2020-09-25 (×6): 1 via ORAL
  Filled 2020-09-19 (×6): qty 1

## 2020-09-19 MED ORDER — SODIUM CHLORIDE 0.9 % IV SOLN
2.0000 g | Freq: Once | INTRAVENOUS | Status: AC
  Administered 2020-09-19: 2 g via INTRAVENOUS
  Filled 2020-09-19: qty 20

## 2020-09-19 MED ORDER — FUROSEMIDE 10 MG/ML IJ SOLN
60.0000 mg | Freq: Two times a day (BID) | INTRAMUSCULAR | Status: DC
  Administered 2020-09-19 – 2020-09-23 (×8): 60 mg via INTRAVENOUS
  Filled 2020-09-19 (×8): qty 6

## 2020-09-19 NOTE — Telephone Encounter (Signed)
Patient called he is having severe sob and hasn't been able to sleep and is sucking air patient states medications aren't working and would like to speak to you regarding his symptoms please advise

## 2020-09-19 NOTE — ED Provider Notes (Signed)
[facility] Emergency Department Provider Note MRN:  [id number]  Arrival date & time: 09/19/20     Chief Complaint   Shortness of Breath and Atrial Fibrillation   History of Present Illness   Carl [name] is a 64 y.o. year-old male with a history of pulmonary embolism, atrial flutter presenting to the ED with chief complaint of shortness of breath.  Worsening shortness of breath when laying flat over the past 3 days.  Developed atrial flutter with RVR and was hospitalized last month and found to have blood clots.  He has been taking regulation in a number of different medications to try to control his heart rate, but he explains that his heart rate has persistently been between 140 and 160 since his discharge.  Endorsing occasional fever, has completed antibiotics for pneumonia from PCP but continues to feel unwell.  Denies any chest pain, no abdominal pain, endorsing worsening swelling to the legs recently.  Review of Systems  A complete 10 system review of systems was obtained and all systems are negative except as noted in the HPI and PMH.   Patient's Health History    Past Medical History:  Diagnosis Date  . Hypercholesteremia   . Primary hypertension   . Pulmonary embolism (HCC) 08/27/2020  . Typical atrial flutter (HCC) 08/27/2020 with PE    Past Surgical History:  Procedure Laterality Date  . LAPAROTOMY      Family History  Problem Relation Age of Onset  . CAD Mother 34  . Heart attack Father   . Heart disease Brother   . Heart failure Brother   . Heart attack Brother     Social History   Socioeconomic History  . Marital status: Widowed    Spouse name: Not on file  . Number of children: 2  . Years of education: Not on file  . Highest education level: Not on file  Occupational History  . Not on file  Tobacco Use  . Smoking status: Current Every Day Smoker    Types: Cigars  . Smokeless tobacco: Never Used  . Tobacco comment: Once a  week  Vaping Use  . Vaping Use: Never used  Substance and Sexual Activity  . Alcohol use: Yes    Alcohol/week: 1.0 standard drink    Types: 1 Glasses of wine per week    Comment: Occasional  . Drug use: Never  . Sexual activity: Not on file  Other Topics Concern  . Not on file  Social History Narrative  . Not on file   Social Determinants of Health   Financial Resource Strain:   . Difficulty of Paying Living Expenses: Not on file  Food Insecurity:   . Worried About Programme researcher, broadcasting/film/video in the Last Year: Not on file  . Ran Out of Food in the Last Year: Not on file  Transportation Needs:   . Lack of Transportation (Medical): Not on file  . Lack of Transportation (Non-Medical): Not on file  Physical Activity:   . Days of Exercise per Week: Not on file  . Minutes of Exercise per Session: Not on file  Stress:   . Feeling of Stress : Not on file  Social Connections:   . Frequency of Communication with Friends and Family: Not on file  . Frequency of Social Gatherings with Friends and Family: Not on file  . Attends Religious Services: Not on file  . Active Member of Clubs or Organizations: Not on file  . Attends Club  or Organization Meetings: Not on file  . Marital Status: Not on file  Intimate Partner Violence:   . Fear of Current or Ex-Partner: Not on file  . Emotionally Abused: Not on file  . Physically Abused: Not on file  . Sexually Abused: Not on file     Physical Exam   Vitals:   09/19/20 1123 09/19/20 1248  BP: 123/84 (!) 116/91  Pulse: (!) 158 (!) 157  Resp: (!) 22 (!) 23  Temp: 100 F (37.8 C)   SpO2: 96% 95%    CONSTITUTIONAL: Well-appearing, NAD NEURO:  Alert and oriented x 3, no focal deficits EYES:  eyes equal and reactive ENT/NECK:  no LAD, no JVD CARDIO: Tachycardic rate, well-perfused, normal S1 and S2 PULM:  CTAB no wheezing or rhonchi GI/GU:  normal bowel sounds, non-distended, non-tender MSK/SPINE:  No gross deformities, 2+ edema to the  bilateral lower extremities SKIN:  no rash, atraumatic PSYCH:  Appropriate speech and behavior  *Additional and/or pertinent findings included in MDM below  Diagnostic and Interventional Summary    EKG Interpretation  Date/Time:  Wednesday September 19 2020 11:25:06 EST Ventricular Rate:  158 PR Interval:    QRS Duration: 111 QT Interval:  338 QTC Calculation: 548 R Axis:   -60 Text Interpretation: Atrial flutter LAD, consider left anterior fascicular block Borderline low voltage, extremity leads Abnormal R-wave progression, early transition Nonspecific T abnormalities, diffuse leads Confirmed by Kennis Carina [phone]) on 09/19/2020 1:16:21 PM      Labs Reviewed  COMPREHENSIVE METABOLIC PANEL - Abnormal; Notable for the following components:      Result Value   Glucose, Bld 112 (*)    Calcium 8.6 (*)    Total Protein 6.0 (*)    Albumin 2.3 (*)    All other components within normal limits  CBC WITH DIFFERENTIAL/PLATELET - Abnormal; Notable for the following components:   WBC 17.0 (*)    RBC 3.88 (*)    Hemoglobin 12.4 (*)    MCV 102.6 (*)    Neutro Abs 15.0 (*)    Monocytes Absolute 1.2 (*)    Abs Immature Granulocytes 0.09 (*)    All other components within normal limits  PROTIME-INR - Abnormal; Notable for the following components:   Prothrombin Time 16.8 (*)    INR 1.4 (*)    All other components within normal limits  URINALYSIS, ROUTINE W REFLEX MICROSCOPIC - Abnormal; Notable for the following components:   Hgb urine dipstick SMALL (*)    All other components within normal limits  CULTURE, BLOOD (SINGLE)  URINE CULTURE  RESPIRATORY PANEL BY RT PCR (FLU A&B, COVID)  LACTIC ACID, PLASMA  APTT  LACTIC ACID, PLASMA  BRAIN NATRIURETIC PEPTIDE  HEPARIN LEVEL (UNFRACTIONATED)  APTT  TROPONIN I (HIGH SENSITIVITY)  TROPONIN I (HIGH SENSITIVITY)    DG Chest Port 1 View  Final Result      Medications  azithromycin (ZITHROMAX) 500 mg in sodium chloride 0.9 % 250 mL  IVPB (has no administration in time range)  amiodarone (NEXTERONE PREMIX) 360-4.14 MG/200ML-% (1.8 mg/mL) IV infusion (60 mg/hr Intravenous New Bag/Given 09/19/20 1235)  amiodarone (NEXTERONE PREMIX) 360-4.14 MG/200ML-% (1.8 mg/mL) IV infusion (has no administration in time range)  diltiazem (CARDIZEM) 125 mg in dextrose 5% 125 mL (1 mg/mL) infusion (5 mg/hr Intravenous New Bag/Given 09/19/20 1238)  heparin ADULT infusion 100 units/mL (25000 units/210mL sodium chloride 0.45%) (has no administration in time range)  cefTRIAXone (ROCEPHIN) 2 g in sodium chloride 0.9 % 100 mL  IVPB (2 g Intravenous New Bag/Given 09/19/20 1241)  diltiazem (CARDIZEM) injection 20 mg (20 mg Intravenous Given 09/19/20 1153)     Procedures  /  Critical Care .Critical Care Performed by: Sabas Sous, MD Authorized by: Sabas Sous, MD   Critical care provider statement:    Critical care time (minutes):  35   Critical care was necessary to treat or prevent imminent or life-threatening deterioration of the following conditions: Atrial flutter with rapid ventricular response, concern for sepsis.   Critical care was time spent personally by me on the following activities:  Discussions with consultants, evaluation of patient's response to treatment, examination of patient, ordering and performing treatments and interventions, ordering and review of laboratory studies, ordering and review of radiographic studies, pulse oximetry, re-evaluation of patient's condition, obtaining history from patient or surrogate and review of old charts    ED Course and Medical Decision Making  I have reviewed the triage vital signs, the nursing notes, and pertinent available records from the EMR.  Listed above are laboratory and imaging tests that I personally ordered, reviewed, and interpreted and then considered in my medical decision making (see below for details).  Concern for atrial flutter with RVR possibly due to underlying  pneumonia and/or increased PE burden though patient endorses full compliance with anticoagulation with the exception of not taking his medicine this morning.  Seems that his rate has been very difficult to control despite a number of home medications, will provide dose of IV diltiazem, will likely need cardiology consultation and admission.     Case discussed with Dr. Odis Hollingshead, who will come to evaluate the patient.  Recommending diltiazem and amiodarone drip as well as heparin drip.  He mentions that patient stopped all of his medicines at some point since his last admission including his anticoagulation and so not a good cardioversion candidate and likely needs to be restarted on heparin.  Given the concern for underlying pneumonia and possibly sepsis will admit to medicine service.  Elmer Sow. Pilar Plate, MD [facility] Emergency Medicine [facility] [email or web address]  Final Clinical Impressions(s) / ED Diagnoses     ICD-10-CM   1. Sepsis, due to unspecified organism, unspecified whether acute organ dysfunction present (HCC)  A41.9   2. Community acquired pneumonia, unspecified laterality  J18.9   3. Atrial flutter with rapid ventricular response (HCC)  I48.92     ED Discharge Orders    None       Discharge Instructions Discussed with and Provided to Patient:   Discharge Instructions   None       Sabas Sous, MD 09/19/20 1319

## 2020-09-19 NOTE — ED Triage Notes (Signed)
Pt arrived via EMS from home c/o sob for the last week which has worsened, reports recent pneumonia diagnosis. EMS reports temp of 102.8 on their arrival with hr of 158. Pt has a hx of afib which he takes Eliquis for.

## 2020-09-19 NOTE — Consult Note (Signed)
CARDIOLOGY CONSULT NOTE  Patient ID: Carl [name] MRN: [id number] DOB/AGE: [birth year] 64 y.o.  Admit date: 09/19/2020 Attending physician: Coralie Keens,* Primary Physician:  Roger Kill, PA-C Outpatient Cardiologist: Dr. Yates Decamp  Inpatient Cardiologist: Tessa Lerner, DO, [facility]  Chief complaint: shortness of breath and palpitations  Reason of Consultation: Symptomatic Atrial Flutter with Acute HFpEF  HPI:  Carl [name] is a 64 y.o. Caucasian male who presents with a chief complaint of " shortness of breath and palpitations." His past medical history and cardiovascular risk factors include: Pulmonary embolism, atrial flutter/fibrillation, dyslipidemia, hypertension, premature CAD in the family, smokes cigars, obesity due to excess calories.  Patient was recently hospitalized in October 2021 and diagnosed with acute bilateral pulmonary embolism and symptomatic atrial flutter with rapid ventricular rates.  Patient underwent cardioversion and restored normal sinus rhythm.  However, prior to discharge he had transitioned into atrial fibrillation.  Since he was hemodynamically stable and rate better controlled compared to admission he was sent home on diltiazem, Multaq, and Eliquis.  However after discharge patient continued to have symptoms of dyspnea at rest and with effort related activities.  He started progressively developing orthopnea, paroxysmal nocturnal dyspnea or lower extremity swelling.  He would notice his heart rate around 140 bpm.  He went to go see his primary care provider thereafter and was diagnosed with pneumonia and started on antibiotics.  At that time he was negative for COVID-19 infection.  Despite initiation of antibiotics patient's dyspnea continued and he was unable to sleep at night.  Given his underlying insomnia patient decided to stop his AV nodal blocking agents as well as Eliquis and Multaq.  He presented to the outpatient cardiology office visit  due to worsening dyspnea and heart failure symptoms.  He was recommended to go to the hospital but chose to restart his oral medications to see if symptoms improved.  However, symptoms continued to be progressive and now comes to the hospital for further evaluation and management.  Cardiology has been consulted for the management of his underlying symptomatic atrial flutter with rapid ventricular rate and acute heart failure with preserved EF.  At the time of evaluation patient is in bed 43 in ER sitting upright and mildly dyspneic, temperature noted to be 100 F, maintaining a blood pressure, heart rate around 150 bpm, currently 96% on 3 L nasal cannula..  Patient underlying rhythm is atrial flutter with rapid ventricular rate.  Given his underlying pneumonia he underwent a chest x-ray which notes moderate to large left-sided pleural effusion which is new compared to prior studies.  From a lab standpoint patient serum creatinine is at baseline, electrolytes are within normal limits, high sensitive troponin negative x2, BNP elevated at 149.  At the time of evaluation patient denies chest pain at rest or with effort related activities.  He does have shortness of breath at rest and appears mildly dyspneic on examination.  He also complains of orthopnea, paroxysmal nocturnal dyspnea, lower extremity swelling and approximately 15 pounds of weight gain since last admission.  ALLERGIES: No Known Allergies  PAST MEDICAL HISTORY: Past Medical History:  Diagnosis Date  . Hypercholesteremia   . Primary hypertension   . Pulmonary embolism (HCC) 08/27/2020  . Typical atrial flutter (HCC) 08/27/2020 with PE  Atrial fibrillation. Obesity.  PAST SURGICAL HISTORY: Past Surgical History:  Procedure Laterality Date  . LAPAROTOMY      FAMILY HISTORY: The patient family history includes CAD (age of onset: 23) in his mother; Heart attack in  his brother and father; Heart disease in his brother; Heart failure  in his brother.   SOCIAL HISTORY:  The patient  reports that he has been smoking cigars. He has never used smokeless tobacco. He reports current alcohol use of about 1.0 standard drink of alcohol per week. He reports that he does not use drugs.  MEDICATIONS: Current Outpatient Medications  Medication Instructions  . allopurinol (ZYLOPRIM) 100 mg, Oral, Every morning  . amoxicillin-clavulanate (AUGMENTIN) 875-125 MG tablet 1 tablet, 2 times daily  . apixaban (ELIQUIS) 5 MG TABS tablet 10 mg twice daily for 7 days followed by 5 mg twice daily for long-term  . atorvastatin (LIPITOR) 10 mg, Oral, Daily  . diltiazem (CARDIZEM CD) 300 mg, Oral, Daily  . doxycycline (MONODOX) 100 mg, 2 times daily  . dronedarone (MULTAQ) 400 mg, Oral, 2 times daily with meals  . furosemide (LASIX) 20 mg, Oral, Daily  . ibuprofen (ADVIL) 400 mg, Oral, Daily  . Lactobacillus Rhamnosus, GG, (RA PROBIOTIC DIGESTIVE CARE) CAPS 1 capsule, Oral, Every morning  . levalbuterol (XOPENEX HFA) 45 MCG/ACT inhaler 1 puff, Inhalation, Every 4 hours PRN  . metoprolol tartrate (LOPRESSOR) 50 mg, Oral, 2 times daily  . tadalafil (CIALIS) 20 mg, Oral, Daily PRN    14 ORGAN REVIEW OF SYSTEMS: Review of Systems  Constitutional: Positive for weight gain. Negative for chills and fever.  HENT: Positive for congestion. Negative for hoarse voice and nosebleeds.   Eyes: Negative for discharge, double vision and pain.  Cardiovascular: Positive for dyspnea on exertion, irregular heartbeat, leg swelling, orthopnea, palpitations and paroxysmal nocturnal dyspnea. Negative for chest pain, claudication, near-syncope and syncope.  Respiratory: Positive for cough and shortness of breath. Negative for hemoptysis.   Musculoskeletal: Negative for muscle cramps and myalgias.  Gastrointestinal: Positive for bloating. Negative for abdominal pain, constipation, diarrhea, hematemesis, hematochezia, melena, nausea and vomiting.  Neurological: Negative  for dizziness and light-headedness.  All other systems reviewed and are negative.   PHYSICAL EXAM: Vitals with BMI 09/19/2020 09/19/2020 09/19/2020  Height - - -  Weight - - -  BMI - - -  Systolic 116 115 390  Diastolic 90 85 87  Pulse 152 150 148    No intake or output data in the 24 hours ending 09/19/20 1501  Net IO Since Admission: No IO data has been entered for this period [09/19/20 1501]  CONSTITUTIONAL: Appears older than stated age, hemodynamically stable, mildly dyspneic on examination. SKIN: Skin is warm and dry. No rash noted. No cyanosis. No pallor. No jaundice HEAD: Normocephalic and atraumatic.  EYES: No scleral icterus MOUTH/THROAT: Moist oral membranes.  NECK: Positive JVD present.  No thyromegaly noted. No carotid bruits  LYMPHATIC: No visible cervical adenopathy.   CHEST Normal respiratory effort. No intercostal retractions  LUNGS: Decreased breath sounds at the bases, crackles noted bilaterally, no stridor. No wheezes. CARDIOVASCULAR: Irregularly irregular, positive S1-S2, tachycardic, no murmurs rubs or gallops appreciated secondary to tachycardia ABDOMINAL: Obese, soft, nontender, nondistended, positive bowel sounds in all 4 quadrants no apparent ascites.  EXTREMITIES: Warm to touch bilaterally, 2+ bilateral pitting edema up to mid thigh.  HEMATOLOGIC: No significant bruising NEUROLOGIC: Oriented to person, place, and time. Nonfocal. Normal muscle tone.  PSYCHIATRIC: Normal mood and affect. Normal behavior. Cooperative  RADIOLOGY: DG Chest Port 1 View  Result Date: 09/19/2020 CLINICAL DATA:  Sepsis EXAM: PORTABLE CHEST 1 VIEW COMPARISON:  08/27/2020 FINDINGS: Cardiac silhouette appears enlarged, although is partially obscured by a moderate to large left-sided pleural effusion. Associated  hazy left basilar opacity. Right lung is clear. No pneumothorax. IMPRESSION: 1. Moderate to large left-sided pleural effusion, new from prior. Associated hazy left basilar  opacity may reflect atelectasis or pneumonia. 2. Cardiomegaly. An underlying pericardial effusion would be difficult to exclude. Electronically Signed   By: Duanne Guess D.O.   On: 09/19/2020 12:18    LABORATORY DATA: Lab Results  Component Value Date   WBC 17.0 (H) 09/19/2020   HGB 12.4 (L) 09/19/2020   HCT 39.8 09/19/2020   MCV 102.6 (H) 09/19/2020   PLT 366 09/19/2020    Recent Labs  Lab 09/19/20 1133  NA 139  K 4.2  CL 104  CO2 25  BUN 10  CREATININE 0.82  CALCIUM 8.6*  PROT 6.0*  BILITOT 0.7  ALKPHOS 92  ALT 27  AST 15  GLUCOSE 112*    Lipid Panel  No results found for: CHOL, TRIG, HDL, CHOLHDL, VLDL, LDLCALC  BNP (last 3 results) Recent Labs    09/19/20 1135  BNP 149.7*    HEMOGLOBIN A1C No results found for: HGBA1C, MPG  Cardiac Panel (last 3 results) Recent Labs    09/19/20 1133 09/19/20 1455  TROPONINIHS 5 4   TSH Recent Labs    08/27/20 1430  TSH 0.457    RADIOLOGY: CT Angio Chest PE W and/or Wo Contrast. Date: 08/27/2020 CLINICAL DATA: Shortness of breath and chest pain  FINDINGS: Cardiovascular: Satisfactory opacification of the pulmonary arteries to the segmental level. There are filling defects in segmental branches of bilateral upper and right middle lobes. Additional suspected filling defects within subsegmental branches. Left atrial enlargement. No evidence of right heart strain. No pericardial effusion. Mediastinum/Nodes: There are no enlarged lymph nodes identified. Thyroid and esophagus are unremarkable. Lungs/Pleura: Patchy areas of atelectasis. No pleural effusion or pneumothorax. Upper Abdomen: No acute abnormality. Musculoskeletal: No acute osseous abnormality. Review of the MIP images confirms the above findings.  IMPRESSION: Acute segmental and subsegmental pulmonary emboli bilaterally. No evidence of right heart strain. Patchy bilateral atelectasis.  CARDIAC DATABASE: EKG: 09/19/2020: Atrial flutter, 158 bpm, left axis  deviation, poor R wave progression, without underlying injury pattern.  Echocardiogram: 08/28/2020:  1. Left ventricular ejection fraction, by estimation, is 50 to 55%. The left ventricle has low normal function. The left ventricle has no regional wall motion abnormalities. Left ventricular diastolic parameters were normal. 2. Right ventricular systolic function is normal. The right ventricular size is normal. There is moderately elevated pulmonary artery systolic pressure. The estimated right ventricular systolic pressure is 40.6 mmHg. 3. The mitral valve is normal in structure. Trivial mitral valve regurgitation. No evidence of mitral stenosis. 4. The aortic valve is normal in structure. Aortic valve regurgitation is not visualized. 5. The inferior vena cava is dilated in size with <50% respiratory variability, suggesting right atrial pressure of 15 mmHg.  IMPRESSION & RECOMMENDATIONS: Carl [name] is a 64 y.o. Caucasian male whose past medical history and cardiovascular risk factors include: Pulmonary embolism, atrial flutter/fibrillation, dyslipidemia, hypertension, premature CAD in the family, smokes cigars, obesity due to excess calories.  Shortness of breath: Multifactorial. Recent diagnosis of acute pulmonary embolism: Patient has been noncompliant with oral anticoagulation since discharge.  Currently on IV heparin, management primary team. Suspect sepsis with underlying leukocytosis of 17,000, low-grade temperature of 100 F, respiratory rate greater than 22 breaths/min, tachycardia, recently diagnosed with pneumonia and moderate to large pleural effusion on chest x-ray.  Will defer management to primary team. Atrial flutter with rapid ventricular rate: Continue IV amiodarone  and Cardizem drip.  Focus on rate control therapy for now. Acute heart failure with preserved EF, stage C, NYHA class III/IV: Continue guideline directed medical therapy, currently on IV Lasix 40 mg twice daily,  strict I's and O's and daily weights, continue telemetry. Recent diagnosis of pneumonia and a moderate to large pleural effusion: Management primary team.  Symptomatic atrial flutter with rapid ventricular rate: Despite recent cardioversion I suspect that he is back in atrial flutter due to his underlying SIRS/sepsis, pneumonia, pleural effusion, and stopping AV nodal blocking agents and anticoagulation. As the underlying conditions get addressed I suspect that his ventricular rate will improve but until then recommend rate control therapy. Continue Cardizem drip. Continue amiodarone drip, will transition to oral amnio in the next 24 hours to minimize IV fluids.  Acute heart failure with preserved EF, stage C NYHA class III/IV: Most likely exacerbated by his underlying symptomatic atrial flutter, pneumonia, SIRS/sepsis. Since last discharge patient complains of orthopnea, paroxysmal nocturnal dyspnea or lower extremity swelling. Subjectively patient states that he is gained approximately 15 pounds. Continue IV Lasix to facilitate diuresis. Strict I's and O's and daily weights.  Recent diagnosis of pulmonary embolism: Currently on IV heparin.  Management primary team.  Secondary Diagnosis: Dyslipidemia: Continue statin therapy.  Follow lipids. Family history of premature CAD: Ischemic evaluation as outpatient unless if change in clinical status during this hospitalization. Cigarette smoking: Educated on the importance of complete cessation given his comorbidities underlying shortness of breath.  Continue your care regarding his other chronic comorbid conditions.  We will follow patient with you during his hospitalization.  Thank you for allowing us to participate in the care of Carl [name].    Total encounter time 85 minutes. *Total Encounter Time as defined by the Centers for Medicare and Medicaid Services includes, in addition to the face-to-face time of a patient visit (documented in the note  above) non-face-to-face time: obtaining and reviewing outside history, ordering and reviewing medications, tests or procedures, care coordination (communications with other health care professionals or caregivers) and documentation in the medical record.  Patient's questions and concerns were addressed to his satisfaction. He voices understanding of the instructions provided during this encounter.   This note was created using a voice recognition software as a result there may be grammatical errors inadvertently enclosed that do not reflect the nature of this encounter. Every attempt is made to correct such errors.  Delilah ShanSunit Trejan Buda, DO, [facility] CenterFACC  Pager: [phone] Office: [phone] 09/19/2020, 3:01 PM

## 2020-09-19 NOTE — Telephone Encounter (Signed)
Patient called, he has had no improvement of symptoms since restarting Multaq.  He is experiencing severe shortness of breath and palpitations as well as orthopnea and PND.  Advised patient to go to [facility] emergency department for evaluation and treatment as we have discussed in his office visit 2 days ago.  Patient verbalized understanding and agreement.

## 2020-09-19 NOTE — Progress Notes (Signed)
ANTICOAGULATION CONSULT NOTE - Follow Up Consult  Pharmacy Consult for IV Heparin Indication: pulmonary embolus, atrial fibrillation  No Known Allergies  Patient Measurements: Total Body Weight: 108 kg Height: 70 inches Heparin Dosing Weight: 96.3 kg  Vital Signs: Temp: 98 F (36.7 C) (11/10 1943) Temp Source: Oral (11/10 1943) BP: 112/83 (11/10 2115) Pulse Rate: 143 (11/10 2115)  Labs: Recent Labs    09/19/20 1133 09/19/20 1455 09/19/20 1945  HGB 12.4*  --   --   HCT 39.8  --   --   PLT 366  --   --   APTT 31  --  48*  LABPROT 16.8*  --   --   INR 1.4*  --   --   HEPARINUNFRC  --   --  0.53  CREATININE 0.82  --   --   TROPONINIHS 5 4  --     Estimated Creatinine Clearance: 112 mL/min (by C-G formula based on SCr of 0.82 mg/dL).   Medical History: Past Medical History:  Diagnosis Date  . Hypercholesteremia   . Primary hypertension   . Pulmonary embolism (HCC) 08/27/2020  . Typical atrial flutter (HCC) 08/27/2020 with PE   Assessment: 64 yr old male presented with SOB; pt is on apixaban PTA for afib/recent PE (last dose taken 11/9 evening).  Pharmacy was consulted to dose heparin for PE. Due to recent apixaban exposure, will monitor anticoagulation using aPTT until aPTT and heparin levels correlate.  Initial aPTT and heparin level ~6 hrs after starting heparin infusion at 1550 units/hr were 48 sec and 0.53 units/ml, respectively. aPTT is below the goal range for this pt; aPTT and heparin level not yet correlating. H/H 12.4/39.8, platelets 366. Per ED RN, no issues with IV or bleeding observed.  Goal of Therapy:  Heparin level 0.3-0.7 units/ml aPTT 66-102 seconds Monitor platelets by anticoagulation protocol: Yes   Plan:  Heparin 2500 units IV bolus Increase heparin infusion to 1850 units/hr F/U 6 hour aPTT, heparin level Monitor daily aPTT, heparin level, CBC Monitor for signs/symptoms of bleeding F/U PE work up, ability to transition back to PO  anticoagulant  Vicki Mallet, PharmD, BCPS, [facility] Clinical Pharmacist 09/19/2020 9:30 PM

## 2020-09-19 NOTE — Progress Notes (Addendum)
ANTICOAGULATION CONSULT NOTE - Initial Consult  Pharmacy Consult for heparin Indication: pulmonary embolus  No Known Allergies  Patient Measurements:   Heparin Dosing Weight: 96.3kg  Vital Signs: Temp: 100 F (37.8 C) (11/10 1123) Temp Source: Oral (11/10 1123) BP: 123/84 (11/10 1123) Pulse Rate: 158 (11/10 1123)  Labs: No results for input(s): HGB, HCT, PLT, APTT, LABPROT, INR, HEPARINUNFRC, HEPRLOWMOCWT, CREATININE, CKTOTAL, CKMB, TROPONINIHS in the last 72 hours.  Estimated Creatinine Clearance: 99.8 mL/min (by C-G formula based on SCr of 0.92 mg/dL).   Medical History: Past Medical History:  Diagnosis Date  . Hypercholesteremia   . Primary hypertension   . Pulmonary embolism (HCC) 08/27/2020  . Typical atrial flutter (HCC) 08/27/2020 with PE   Assessment: 59 YOM presenting with SOB, on Eliquis PTA for afib/recent PE last taken 11/9 evening.  PE workup to start heparin gtt.    Goal of Therapy:  Heparin level 0.3-0.7 units/ml aPTT 66-102 seconds Monitor platelets by anticoagulation protocol: Yes   Plan:  Heparin gtt at 1550 units/hr, no bolus F/u 6 hour aPTT/HL F/u PE work up, ability to transition back to PO  Daylene Posey, PharmD Clinical Pharmacist ED Pharmacist Phone # [phone] 09/19/2020 12:07 PM

## 2020-09-19 NOTE — H&P (Addendum)
History and Physical    [name] OQH:[id number] DOB: [birth year] DOA: 09/19/2020  PCP: Roger Kill, PA-C   Patient coming from: Home   Chief Complaint: Dyspnea and palpitations.  HPI: Carl [name] is a 64 y.o. male with medical history significant of atrial fibrillation, dyslipidemia, hypertension, obesity, pulmonary embolism home presented with palpitations and dyspnea.  He was recently hospitalized 08/27/2020 to 08/30/2020, diagnosed with acute bilateral pulmonary embolism complicated with atrial fibrillation with rapid ventricular response. Patient was discharged on metoprolol, Multaq and diltiazem.  When he reached his home he was able to do his activities of daily living, including walking around his block.  Over the subsequent days he developed progressive and worsening dyspnea on exertion, associated with PND, orthopnea and lower extremity edema.  He had intermittent palpitations with a heart rate at home between 140 and 160.  On October 27 he went to his primary care provider, with a complaint of cough and chills, he tested negative for COVID-19, and he was started on antibiotic therapy with Augmentin and doxycycline for presumed pneumonia.  November 2 he had a post hospitalization follow-up with his primary care provider, he was noted to have lower extremity edema and persistent dyspnea.  Reported to be noncompliant with his medications including apixaban, metoprolol, Cardizem and Multaq.  He was started on furosemide, 20 mg daily, and he was advised to resume apixaban, metoprolol and Cardizem. In regards of Multaq, since he reported side effects (not feeling himself and not able to sleep) it was recommended to continue to hold.  Referred for follow-up with cardiology.   November 8 he had a follow-up with cardiology, he was found in atrial fibrillation with 2- 1 conduction, he was advised to resume Multaq.  He was given the option of  hospital admission or close follow-up  on Multaq, he preferred the latter option and went home.    Over the last 24 hours her dyspnea has been severe in intensity, to the point where he is dyspneic with minimal efforts, no improving factors, worse with movement, associated with a total of 15 pound weight gain over the last [redacted] weeks along of lower extremity edema, PND and orthopnea.  ED Course: Patient was noted on atrial fibrillation with rapid ventricular response, was placed on amiodarone and diltiazem infusion per cardiology recommendations.  Anticoagulation with IV heparin.  For presumptive pneumonia he received azithromycin and ceftriaxone.  Referred for further inpatient management.  Review of Systems:  1. General: positive subjective fevers, and chills, positive weight gain 15 lbs.  2. ENT: No runny nose or sore throat, no hearing disturbances 3. Pulmonary: Positive dyspnea and cough as mentioned in HPI.  4. Cardiovascular: No angina, claudication, positive lower extremity edema, pnd and orthopnea 5. Gastrointestinal: No nausea or vomiting, no diarrhea or constipation 6. Hematology: No easy bruisability or frequent infections 7. Urology: No dysuria, hematuria or increased urinary frequency 8. Dermatology: No rashes. 9. Neurology: No seizures or paresthesias 10. Musculoskeletal: No joint pain or deformities  Past Medical History:  Diagnosis Date  . Hypercholesteremia   . Primary hypertension   . Pulmonary embolism (HCC) 08/27/2020  . Typical atrial flutter (HCC) 08/27/2020 with PE    Past Surgical History:  Procedure Laterality Date  . LAPAROTOMY       reports that he has been smoking cigars. He has never used smokeless tobacco. He reports current alcohol use of about 1.0 standard drink of alcohol per week. He reports that he does not use drugs.  No  Known Allergies  Family History  Problem Relation Age of Onset  . CAD Mother 51  . Heart attack Father   . Heart disease Brother   . Heart failure Brother   .  Heart attack Brother      Prior to Admission medications   Medication Sig Start Date End Date Taking? Authorizing Provider  allopurinol (ZYLOPRIM) 100 MG tablet Take 100 mg by mouth in the morning.  02/20/20  Yes [provider]  apixaban (ELIQUIS) 5 MG TABS tablet 10 mg twice daily for 7 days followed by 5 mg twice daily for long-term Patient taking differently: Take 5 mg by mouth 2 (two) times daily.  08/30/20  Yes Dahal, Melina Schools, MD  atorvastatin (LIPITOR) 10 MG tablet Take 10 mg by mouth daily.   Yes [provider]  diltiazem (CARDIZEM CD) 300 MG 24 hr capsule Take 1 capsule (300 mg total) by mouth daily. 08/30/20 09/29/20 Yes Dahal, Melina Schools, MD  dronedarone (MULTAQ) 400 MG tablet Take 1 tablet (400 mg total) by mouth 2 (two) times daily with a meal. 09/17/20 10/17/20 Yes Cantwell, Celeste C, PA-C  furosemide (LASIX) 20 MG tablet Take 20 mg by mouth daily. 09/12/20  Yes [provider]  ibuprofen (ADVIL) 200 MG tablet Take 400 mg by mouth daily.   Yes [provider]  Lactobacillus Rhamnosus, GG, (RA PROBIOTIC DIGESTIVE CARE) CAPS Take 1 capsule by mouth in the morning.   Yes [provider]  levalbuterol (XOPENEX HFA) 45 MCG/ACT inhaler Inhale 1 puff into the lungs every 4 (four) hours as needed for wheezing. 08/30/20 08/30/21 Yes Dahal, Melina Schools, MD  metoprolol tartrate (LOPRESSOR) 50 MG tablet Take 1 tablet (50 mg total) by mouth 2 (two) times daily. 08/30/20 09/29/20 Yes Dahal, Melina Schools, MD  tadalafil (CIALIS) 20 MG tablet Take 20 mg by mouth daily as needed for erectile dysfunction.  02/17/20  Yes [provider]  amoxicillin-clavulanate (AUGMENTIN) 875-125 MG tablet Take 1 tablet by mouth 2 (two) times daily. For 7 days Patient not taking: Reported on 09/19/2020    [provider]  doxycycline (MONODOX) 100 MG capsule Take 100 mg by mouth 2 (two) times daily. For 7 days Patient not taking: Reported on 09/19/2020    [provider]    Physical Exam: Vitals:   09/19/20 1200 09/19/20 1230 09/19/20 1248 09/19/20 1300  BP: 114/83 (!) 116/91 (!) 116/91 116/88  Pulse: (!) 157 (!) 157 (!) 157 (!) 156  Resp: 18 (!) 39 (!) 23 (!) 27  Temp:      TempSrc:      SpO2: 97% 94% 95% 96%    Vitals:   09/19/20 1200 09/19/20 1230 09/19/20 1248 09/19/20 1300  BP: 114/83 (!) 116/91 (!) 116/91 116/88  Pulse: (!) 157 (!) 157 (!) 157 (!) 156  Resp: 18 (!) 39 (!) 23 (!) 27  Temp:      TempSrc:      SpO2: 97% 94% 95% 96%   General: deconditioned and ill looking appearing  Neurology: Awake and alert, non focal Head and Neck. Head normocephalic. Neck supple with no adenopathy or thyromegaly.  Wide neck.  E ENT: no pallor, no icterus, oral mucosa moist Cardiovascular: No JVD. S1-S2 present, irregularly irregular with no gallops, rubs, or murmurs. +++ pitting bilateral lower extremity edema. Pulmonary: positive breath sounds bilaterally,with no wheezing or rhonchi, positive bilateral diffuse rales. Gastrointestinal. Abdomen protuberant Skin. No rashes/ warm skin lower extremities.  Musculoskeletal: no joint deformities    Labs on  Admission: I have personally reviewed following labs and imaging studies  CBC: Recent Labs  Lab 09/19/20 1133  WBC 17.0*  NEUTROABS 15.0*  HGB 12.4*  HCT 39.8  MCV 102.6*  PLT 366   Basic Metabolic Panel: Recent Labs  Lab 09/19/20 1133  NA 139  K 4.2  CL 104  CO2 25  GLUCOSE 112*  BUN 10  CREATININE 0.82  CALCIUM 8.6*   GFR: Estimated Creatinine Clearance: 112 mL/min (by C-G formula based on SCr of 0.82 mg/dL). Liver Function Tests: Recent Labs  Lab 09/19/20 1133  AST 15  ALT 27  ALKPHOS 92  BILITOT 0.7  PROT 6.0*  ALBUMIN 2.3*   No results for input(s): LIPASE, AMYLASE in the last 168 hours. No results for input(s): AMMONIA in the last 168 hours. Coagulation Profile: Recent Labs  Lab 09/19/20 1133  INR 1.4*   Cardiac Enzymes: No results for input(s):  CKTOTAL, CKMB, CKMBINDEX, TROPONINI in the last 168 hours. BNP (last 3 results) No results for input(s): PROBNP in the last 8760 hours. HbA1C: No results for input(s): HGBA1C in the last 72 hours. CBG: No results for input(s): GLUCAP in the last 168 hours. Lipid Profile: No results for input(s): CHOL, HDL, LDLCALC, TRIG, CHOLHDL, LDLDIRECT in the last 72 hours. Thyroid Function Tests: No results for input(s): TSH, T4TOTAL, FREET4, T3FREE, THYROIDAB in the last 72 hours. Anemia Panel: No results for input(s): VITAMINB12, FOLATE, FERRITIN, TIBC, IRON, RETICCTPCT in the last 72 hours. Urine analysis:    Component Value Date/Time   COLORURINE YELLOW 09/19/2020 1133   APPEARANCEUR CLEAR 09/19/2020 1133   LABSPEC 1.017 09/19/2020 1133   PHURINE 5.0 09/19/2020 1133   GLUCOSEU NEGATIVE 09/19/2020 1133   HGBUR SMALL (A) 09/19/2020 1133   BILIRUBINUR NEGATIVE 09/19/2020 1133   KETONESUR NEGATIVE 09/19/2020 1133   PROTEINUR NEGATIVE 09/19/2020 1133   NITRITE NEGATIVE 09/19/2020 1133   LEUKOCYTESUR NEGATIVE 09/19/2020 1133    Radiological Exams on Admission: DG Chest Port 1 View  Result Date: 09/19/2020 CLINICAL DATA:  Sepsis EXAM: PORTABLE CHEST 1 VIEW COMPARISON:  08/27/2020 FINDINGS: Cardiac silhouette appears enlarged, although is partially obscured by a moderate to large left-sided pleural effusion. Associated hazy left basilar opacity. Right lung is clear. No pneumothorax. IMPRESSION: 1. Moderate to large left-sided pleural effusion, new from prior. Associated hazy left basilar opacity may reflect atelectasis or pneumonia. 2. Cardiomegaly. An underlying pericardial effusion would be difficult to exclude. Electronically Signed   By: Duanne GuessNicholas  Plundo D.O.   On: 09/19/2020 12:18    EKG: Independently reviewed.  158 bpm, left axis deviation, left anterior fascicular block, QTC 507, 2: 1 atrial flutter, no significant ST segment or T wave changes.  Assessment/Plan Principal Problem:    Acute on chronic diastolic CHF (congestive heart failure) (HCC) Active Problems:   Typical atrial flutter (HCC)   Acute pulmonary embolism (HCC)   Atrial fibrillation (HCC)   Dyslipidemia   Class 1 obesity   Gout   Pleural effusion on left    64 year old male with past medical history for pulmonary embolism, obesity and difficult to control atrial fibrillation who presents with worsening dyspnea on exertion, PND, orthopnea, lower extremity edema and weight gain.  His symptoms have been refractive to outpatient management with low-dose furosemide and antibiotics.  He has been noncompliant with his cardiovascular medications.  On his initial physical examination blood pressure 107/82, heart rate 159, respiratory rate 17, oxygen saturation 94% on 3 L per nasal cannula, temperature 37.8 C.  He does  have rales on auscultation bilaterally, heart S1-S2, present, rhythmic, tachycardic, irregularly irregular, protuberant abdomen, 3+ pitting bilateral symmetric edema.  Sodium 139, potassium 4.2, chloride 104, bicarb 25, glucose 112, BUN 10, creatinine 0.86, AST 15, LT 27, BNP 149, troponin I 5, lactic acid 1.1, white count 17.0, hemoglobin 12.4, hematocrit 39.8, platelets 366.  SARS COVID-19 pending (unvaccinated), urinalysis specific gravity 1.017, 0-5 white cells, 0-5 red cells. Chest radiograph with large left pleural effusion, congested hilar vasculature, cardiomegaly (personally reviewed).  Mr. Beeks, will be hospitalized with a working diagnosis of acute on chronic diastolic heart failure exacerbation in the setting of atrial flutter with rapid ventricular response and complicated with acute cardiogenic pulmonary edema with a large left pleural effusion.  1.  Acute on chronic diastolic heart failure exacerbation, acute cardiogenic pulmonary edema, large left pleural effusion acute hypoxic respiratory failure.  Patient will be admitted to the telemetry ward, aggressive diuresis with intravenous  furosemide 60 mg IV every 12 hours, to target negative fluid balance. Strict in and out.   Continue oximetry monitoring and supplemental oxygen per nasal cannula to keep an oxygen saturation more 92%.  Echocardiogram from 08/2020 with preserved LV and RV systolic function.  Patient will need aggressive rate control of atrial flutter/atrial fibrillation.  Consult interventional radiology for a left ultrasound-guided thoracentesis.  Request pleural fluid LDH, protein, pH, cell count, and culture.  Likely transudate.  2.  Atrial flutter/atrial fibrillation, rapid ventricular response/ prolonged qtc  Continue amiodarone and diltiazem infusions.  Close telemetry monitoring.  Anticoagulation with intravenous heparin. Patient may need cardioversion during his hospitalization, follow-up with cardiology recommendations.   Keep K at 4 and Mg at 2.   3.  Leukocytosis/ possible pneumonia left lower lobe (present on admission).  Likely reactive leukocytosis, no frank symptoms of bacterial infection. For now will continue antibiotic therapy with rapid de-escalation.  Follow-up on cultures, cell count and temperature curve.  4.  Gout, obesity class I and dyslipidemia.  Continue allopurinol and atorvastatin.  Calculated BMI 34.5  5. Pulmonary embolism. Continue anticoagulation with heparin for now.   Status is: Inpatient  Remains inpatient appropriate because:IV treatments appropriate due to intensity of illness or inability to take PO  Patient critically ill with imminent risk for hemodynamic decompensation.  Critical care time 60 minutes.   Dispo: The patient is from: Home              Anticipated d/c is to: Home              Anticipated d/c date is: > 3 days              Patient currently is not medically stable to d/c.   DVT prophylaxis: Heparin   Code Status:   full  Family Communication:  No family at the bedside     Consults called:  Cardiology   Admission status:  inpatient   Humzah Harty  Annett Gula MD [facility]   09/19/2020, 1:28 PM

## 2020-09-19 NOTE — ED Notes (Signed)
Dinner tray delivered.

## 2020-09-19 NOTE — ED Notes (Signed)
Notified Julian Reil, DO via chat :  Pt's Amiodarone drip order has expired. Amiodarone is still infusing. HR 144, A fib with RVR.

## 2020-09-20 ENCOUNTER — Inpatient Hospital Stay (HOSPITAL_COMMUNITY): Payer: Federal, State, Local not specified - PPO

## 2020-09-20 DIAGNOSIS — I10 Essential (primary) hypertension: Secondary | ICD-10-CM

## 2020-09-20 DIAGNOSIS — J189 Pneumonia, unspecified organism: Secondary | ICD-10-CM

## 2020-09-20 DIAGNOSIS — I4892 Unspecified atrial flutter: Secondary | ICD-10-CM

## 2020-09-20 DIAGNOSIS — I4891 Unspecified atrial fibrillation: Secondary | ICD-10-CM

## 2020-09-20 DIAGNOSIS — Z79899 Other long term (current) drug therapy: Secondary | ICD-10-CM

## 2020-09-20 DIAGNOSIS — R0600 Dyspnea, unspecified: Secondary | ICD-10-CM

## 2020-09-20 HISTORY — PX: IR THORACENTESIS ASP PLEURAL SPACE W/IMG GUIDE: IMG5380

## 2020-09-20 LAB — CBC
HCT: 39.5 % (ref 39.0–52.0)
Hemoglobin: 12.3 g/dL — ABNORMAL LOW (ref 13.0–17.0)
MCH: 32.6 pg (ref 26.0–34.0)
MCHC: 31.1 g/dL (ref 30.0–36.0)
MCV: 104.8 fL — ABNORMAL HIGH (ref 80.0–100.0)
Platelets: 333 10*3/uL (ref 150–400)
RBC: 3.77 MIL/uL — ABNORMAL LOW (ref 4.22–5.81)
RDW: 12.8 % (ref 11.5–15.5)
WBC: 12.6 10*3/uL — ABNORMAL HIGH (ref 4.0–10.5)
nRBC: 0 % (ref 0.0–0.2)

## 2020-09-20 LAB — APTT
aPTT: 87 seconds — ABNORMAL HIGH (ref 24–36)
aPTT: 47 seconds — ABNORMAL HIGH (ref 24–36)
aPTT: 63 seconds — ABNORMAL HIGH (ref 24–36)

## 2020-09-20 LAB — BODY FLUID CELL COUNT WITH DIFFERENTIAL
Lymphs, Fluid: 6 %
Monocyte-Macrophage-Serous Fluid: 26 % — ABNORMAL LOW (ref 50–90)
Neutrophil Count, Fluid: 68 % — ABNORMAL HIGH (ref 0–25)
Total Nucleated Cell Count, Fluid: 860 cu mm (ref 0–1000)

## 2020-09-20 LAB — BASIC METABOLIC PANEL
Anion gap: 9 (ref 5–15)
BUN: 13 mg/dL (ref 8–23)
CO2: 28 mmol/L (ref 22–32)
Calcium: 8.8 mg/dL — ABNORMAL LOW (ref 8.9–10.3)
Chloride: 101 mmol/L (ref 98–111)
Creatinine, Ser: 0.93 mg/dL (ref 0.61–1.24)
GFR, Estimated: >60 mL/min (ref >60)
Glucose, Bld: 112 mg/dL — ABNORMAL HIGH (ref 70–99)
Potassium: 4 mmol/L (ref 3.5–5.1)
Sodium: 138 mmol/L (ref 135–145)

## 2020-09-20 LAB — PROTEIN, PLEURAL OR PERITONEAL FLUID: Total protein, fluid: 3.7 g/dL

## 2020-09-20 LAB — PROCALCITONIN: Procalcitonin: <0.1 ng/mL

## 2020-09-20 LAB — GRAM STAIN

## 2020-09-20 LAB — HEPARIN LEVEL (UNFRACTIONATED)
Heparin Unfractionated: 0.87 IU/mL — ABNORMAL HIGH (ref 0.30–0.70)
Heparin Unfractionated: 0.48 IU/mL (ref 0.30–0.70)

## 2020-09-20 LAB — LACTATE DEHYDROGENASE, PLEURAL OR PERITONEAL FLUID: LD, Fluid: 84 U/L — ABNORMAL HIGH (ref 3–23)

## 2020-09-20 LAB — MAGNESIUM: Magnesium: 2.1 mg/dL (ref 1.7–2.4)

## 2020-09-20 MED ORDER — METOPROLOL TARTRATE 50 MG PO TABS
50.0000 mg | ORAL_TABLET | Freq: Two times a day (BID) | ORAL | Status: DC
  Administered 2020-09-20 – 2020-09-22 (×4): 50 mg via ORAL
  Filled 2020-09-20 (×4): qty 1

## 2020-09-20 MED ORDER — DOXYCYCLINE HYCLATE 100 MG PO TABS
100.0000 mg | ORAL_TABLET | Freq: Two times a day (BID) | ORAL | Status: DC
  Administered 2020-09-21 – 2020-09-26 (×11): 100 mg via ORAL
  Filled 2020-09-20 (×11): qty 1

## 2020-09-20 MED ORDER — METOLAZONE 2.5 MG PO TABS
2.5000 mg | ORAL_TABLET | Freq: Every day | ORAL | Status: DC
  Administered 2020-09-20 – 2020-09-22 (×3): 2.5 mg via ORAL
  Filled 2020-09-20 (×3): qty 1

## 2020-09-20 MED ORDER — LIDOCAINE HCL (PF) 1 % IJ SOLN
INTRAMUSCULAR | Status: AC | PRN
  Administered 2020-09-20: 10 mL

## 2020-09-20 MED ORDER — AZITHROMYCIN 500 MG PO TABS
500.0000 mg | ORAL_TABLET | Freq: Every day | ORAL | Status: DC
  Administered 2020-09-20: 500 mg via ORAL
  Filled 2020-09-20: qty 2

## 2020-09-20 MED ORDER — LIDOCAINE HCL 1 % IJ SOLN
INTRAMUSCULAR | Status: AC
  Filled 2020-09-20: qty 20

## 2020-09-20 MED ORDER — ENOXAPARIN SODIUM 120 MG/0.8ML ~~LOC~~ SOLN
110.0000 mg | Freq: Two times a day (BID) | SUBCUTANEOUS | Status: DC
  Administered 2020-09-20 – 2020-09-21 (×2): 110 mg via SUBCUTANEOUS
  Filled 2020-09-20 (×3): qty 0.74

## 2020-09-20 MED ORDER — AMIODARONE HCL 200 MG PO TABS
200.0000 mg | ORAL_TABLET | Freq: Every day | ORAL | Status: DC
  Administered 2020-09-20 – 2020-09-23 (×4): 200 mg via ORAL
  Filled 2020-09-20 (×4): qty 1

## 2020-09-20 NOTE — Progress Notes (Signed)
PROGRESS NOTE    Carl [name]  VQQ:[id number] DOB: 1955/12/10 DOA: 09/19/2020 PCP: Roger Kill, PA-C   Brief Narrative: Carl [name] is a 64 y.o. male with a history of atrial fibrillation, PE, obesity, diastolic heart failure. He presented secondary to dyspnea and was found to have atrial flutter with RVR, left pleural effusion, respiratory failure and evidence of acute heart failure. He has been started on diltiazem/amiodarone drips, IV lasix, heparin drip.   Assessment & Plan:   Principal Problem:   Atrial flutter with rapid ventricular response (HCC) Active Problems:   Primary hypertension   Typical atrial flutter (HCC)   Acute pulmonary embolism (HCC)   Dyslipidemia   Class 1 obesity   Acute on chronic diastolic CHF (congestive heart failure) (HCC)   Gout   Pleural effusion on left   Atrial flutter with rapid ventricular response Patient states he has been adherent with outpatient regimen except he has not been taking dronedarone secondary to dyspnea. In setting of likely pneumonia. -Continue amiodarone IV and Cardizem IV -Transfer to Progressive level of care -Cardiology recommendations: Continue amiodarone/cardizem IV; no cardioversion for now  Acute on chronic diastolic heart failure In setting of above. Associated pleural effusion. -Continue Lasix  Acute respiratory failure with hypoxia Most likely secondary to pleural effusion. Recent PE could be contributing but not as likely. Placed on 3 L of oxygen. Documented hypoxia with SpO2 of 89%. -Continue oxygen and wean to room air as able  Pleural effusion Left sided. In setting of rapid atrial flutter and acute heart failure. Thoracentesis ordered and pending today. -Follow-up thoracentesis/labs  Sepsis Present on admission with possible pneumonia as source. Normal lactic acid. Blood and urine cultures obtained on admission. -Follow up cultures -Procalcitonin  Community acquired pneumonia Patient  with fever (102.8 F prior to admission), associated leukocytosis and x-ray findings consistent with possible pneumonia. Patient was started on treatment as an outpatient with Augmentin PO. Started on Ceftriaxone on admission. Leukocytosis improved today.  -Continue Ceftriaxone  Acute pulmonary embolism Patient is currently being treated with Eliquis as an outpatient. Transitioned to heparin drip inpatient. -Continue heparin drip  Primary hypertension -Manage with diltiazem drip for now  Hyperlipidemia -Continue Lipitor   DVT prophylaxis: Heparin drip Code Status:   Code Status: Full Code Family Communication: None at bedside Disposition Plan: Progressive unit. Likely discharge home in 2-3 days pending improvement of heart rate, wean to room air if able and transition to oral antibiotics.   Consultants:   Cardiology  Procedures:   None  Antimicrobials:  Ceftriaxone  Azithromycin    Subjective: No dyspnea or chest pain.  Objective: Vitals:   09/20/20 0430 09/20/20 0500 09/20/20 0515 09/20/20 0600  BP: (!) 124/96 111/90 112/84 115/82  Pulse: (!) 141 (!) 144 (!) 146 (!) 144  Resp: (!) 27 (!) 31 (!) 30 (!) 29  Temp:      TempSrc:      SpO2: 97% 98% 97% 95%    Intake/Output Summary (Last 24 hours) at 09/20/2020 0759 Last data filed at 09/20/2020 0615 Gross per 24 hour  Intake 474 ml  Output 2800 ml  Net -2326 ml   There were no vitals filed for this visit.  Examination:  General exam: Appears calm and comfortable Respiratory system: Clear to auscultation. Respiratory effort normal. Cardiovascular system: S1 & S2 heard, tachycardia, normal rate. No murmurs, rubs, gallops or clicks. Gastrointestinal system: Abdomen is distended, soft and nontender. No organomegaly or masses felt. Normal bowel sounds heard. Central nervous  system: Alert and oriented. No focal neurological deficits. Musculoskeletal: No edema. No calf tenderness Skin: No cyanosis. No  rashes Psychiatry: Judgement and insight appear normal. Mood & affect appropriate.     Data Reviewed: I have personally reviewed following labs and imaging studies  CBC Lab Results  Component Value Date   WBC 12.6 (H) 09/20/2020   RBC 3.77 (L) 09/20/2020   HGB 12.3 (L) 09/20/2020   HCT 39.5 09/20/2020   MCV 104.8 (H) 09/20/2020   MCH 32.6 09/20/2020   PLT 333 09/20/2020   MCHC 31.1 09/20/2020   RDW 12.8 09/20/2020   LYMPHSABS 0.7 09/19/2020   MONOABS 1.2 (H) 09/19/2020   EOSABS 0.0 09/19/2020   BASOSABS 0.1 09/19/2020     Last metabolic panel Lab Results  Component Value Date   NA 138 09/20/2020   K 4.0 09/20/2020   CL 101 09/20/2020   CO2 28 09/20/2020   BUN 13 09/20/2020   CREATININE 0.93 09/20/2020   GLUCOSE 112 (H) 09/20/2020   GFRNONAA >60 09/20/2020   CALCIUM 8.8 (L) 09/20/2020   PHOS 2.4 (L) 08/30/2020   PROT 6.0 (L) 09/19/2020   ALBUMIN 2.3 (L) 09/19/2020   BILITOT 0.7 09/19/2020   ALKPHOS 92 09/19/2020   AST 15 09/19/2020   ALT 27 09/19/2020   ANIONGAP 9 09/20/2020    CBG (last 3)  No results for input(s): GLUCAP in the last 72 hours.   GFR: Estimated Creatinine Clearance: 98.7 mL/min (by C-G formula based on SCr of 0.93 mg/dL).  Coagulation Profile: Recent Labs  Lab 09/19/20 1133  INR 1.4*    Recent Results (from the past 240 hour(s))  Respiratory Panel by RT PCR (Flu A&B, Covid) - Nasopharyngeal Swab     Status: None   Collection Time: 09/19/20  1:11 PM   Specimen: Nasopharyngeal Swab  Result Value Ref Range Status   SARS Coronavirus 2 by RT PCR NEGATIVE NEGATIVE Final    Comment: (NOTE) SARS-CoV-2 target nucleic acids are NOT DETECTED.  The SARS-CoV-2 RNA is generally detectable in upper respiratoy specimens during the acute phase of infection. The lowest concentration of SARS-CoV-2 viral copies this assay can detect is 131 copies/mL. A negative result does not preclude SARS-Cov-2 infection and should not be used as the sole basis  for treatment or other patient management decisions. A negative result may occur with  improper specimen collection/handling, submission of specimen other than nasopharyngeal swab, presence of viral mutation(s) within the areas targeted by this assay, and inadequate number of viral copies (<131 copies/mL). A negative result must be combined with clinical observations, patient history, and epidemiological information. The expected result is Negative.  Fact Sheet for Patients:  https://www.moore.com/  Fact Sheet for Healthcare Providers:  https://www.young.biz/  This test is no t yet approved or cleared by the [place] FDA and  has been authorized for detection and/or diagnosis of SARS-CoV-2 by FDA under an Emergency Use Authorization (EUA). This EUA will remain  in effect (meaning this test can be used) for the duration of the COVID-19 declaration under Section 564(b)(1) of the Act, 21 U.S.C. section 360bbb-3(b)(1), unless the authorization is terminated or revoked sooner.     Influenza A by PCR NEGATIVE NEGATIVE Final   Influenza B by PCR NEGATIVE NEGATIVE Final    Comment: (NOTE) The Xpert Xpress SARS-CoV-2/FLU/RSV assay is intended as an aid in  the diagnosis of influenza from Nasopharyngeal swab specimens and  should not be used as a sole basis for treatment. Nasal washings and  aspirates are unacceptable for Xpert Xpress SARS-CoV-2/FLU/RSV  testing.  Fact Sheet for Patients: https://www.moore.com/  Fact Sheet for Healthcare Providers: https://www.young.biz/  This test is not yet approved or cleared by the [place] FDA and  has been authorized for detection and/or diagnosis of SARS-CoV-2 by  FDA under an Emergency Use Authorization (EUA). This EUA will remain  in effect (meaning this test can be used) for the duration of the  Covid-19 declaration under Section 564(b)(1) of the Act, 21   U.S.C. section 360bbb-3(b)(1), unless the authorization is  terminated or revoked. Performed at [facility], [address], [place], [place] [address]         Radiology Studies: DG Chest Port 1 View  Result Date: 09/19/2020 CLINICAL DATA:  Sepsis EXAM: PORTABLE CHEST 1 VIEW COMPARISON:  08/27/2020 FINDINGS: Cardiac silhouette appears enlarged, although is partially obscured by a moderate to large left-sided pleural effusion. Associated hazy left basilar opacity. Right lung is clear. No pneumothorax. IMPRESSION: 1. Moderate to large left-sided pleural effusion, new from prior. Associated hazy left basilar opacity may reflect atelectasis or pneumonia. 2. Cardiomegaly. An underlying pericardial effusion would be difficult to exclude. Electronically Signed   By: Duanne Guess D.O.   On: 09/19/2020 12:18        Scheduled Meds: . allopurinol  100 mg Oral q AM  . atorvastatin  10 mg Oral Daily  . furosemide  60 mg Intravenous Q12H  . lidocaine       Continuous Infusions: . amiodarone 30 mg/hr (09/20/20 0616)  . cefTRIAXone (ROCEPHIN)  IV    . diltiazem (CARDIZEM) infusion 15 mg/hr (09/20/20 1914)  . heparin 1,850 Units/hr (09/20/20 0158)     LOS: 1 day     Jacquelin Hawking, MD [facility] 09/20/2020, 7:59 AM  If 7PM-7AM, please contact night-coverage [email or web address]

## 2020-09-20 NOTE — Progress Notes (Signed)
Progress Note  Patient Name: Carl [name] Date of Encounter: 09/20/2020  Attending physician: Narda Bonds, MD Primary care provider: Roger Kill, PA-C Primary Cardiologist: Dr. Yates Decamp Consultant:Kachina Niederer Odis Hollingshead, DO  Subjective: Carl [name] is a 64 y.o. male who was seen and examined at bedside at approximately 6 PM.  No family present at bedside. Patient's ventricular rate has improved since yesterday but still not at goal. Status post thoracentesis. Patient states that his shortness of breath has improved significantly. Denies chest pain. Continues to have lower extremity swelling, orthopnea. Continues to diurese with IV diuretics.  Objective: Vital Signs in the last 24 hours: Temp:  [97.6 F (36.4 C)-99.7 F (37.6 C)] 99.2 F (37.3 C) (11/11 1511) Pulse Rate:  [72-152] 140 (11/11 1511) Resp:  [13-34] 18 (11/11 1511) BP: (85-124)/(47-96) 116/78 (11/11 1511) SpO2:  [89 %-100 %] 94 % (11/11 1511) Weight:  [107.3 kg] 107.3 kg (11/11 1511)  Intake/Output:  Intake/Output Summary (Last 24 hours) at 09/20/2020 1840 Last data filed at 09/20/2020 1805 Gross per 24 hour  Intake 1034 ml  Output 4625 ml  Net -3591 ml    Net IO Since Admission: -4,791 mL [09/20/20 1840]  Weights:  Filed Weights   09/20/20 1511  Weight: 107.3 kg    Telemetry: Personally reviewed.   Physical examination: PHYSICAL EXAM: Vitals with BMI 09/20/2020 09/20/2020 09/20/2020  Height 5\' 10"  - -  Weight 236 lbs 8 oz - -  BMI 33.93 - -  Systolic 116 118  Diastolic 78 80 75  Pulse 140 146 147    CONSTITUTIONAL: Appears older than stated age, hemodynamically stable. SKIN: Skin is warm and dry. No rash noted. No cyanosis. No pallor. No jaundice HEAD: Normocephalic and atraumatic.  EYES: No scleral icterus MOUTH/THROAT: Moist oral membranes.  NECK: Positive JVD present.  No thyromegaly noted. No carotid bruits  LYMPHATIC: No visible cervical adenopathy.   CHEST Normal  respiratory effort. No intercostal retractions  LUNGS:  Improved aeration bilaterally compared to yesterday, crackles noted bilaterally, decreased breath sounds at the bases.   CARDIOVASCULAR: Irregularly irregular, positive S1-S2, tachycardic, no murmurs rubs or gallops appreciated secondary to tachycardia ABDOMINAL: Obese, soft, nontender, nondistended, positive bowel sounds in all 4 quadrants no apparent ascites.  EXTREMITIES: Warm to touch bilaterally, 2+ bilateral pitting edema up to knee HEMATOLOGIC: No significant bruising NEUROLOGIC: Oriented to person, place, and time. Nonfocal. Normal muscle tone.  PSYCHIATRIC: Normal mood and affect. Normal behavior. Cooperative  Lab Results: Hematology Recent Labs  Lab 09/19/20 1133 09/20/20 0400  WBC 17.0* 12.6*  RBC 3.88* 3.77*  HGB 12.4* 12.3*  HCT 39.8 39.5  MCV 102.6* 104.8*  MCH 32.0 32.6  MCHC 31.2 31.1  RDW 12.9 12.8  PLT 366 333    Chemistry Recent Labs  Lab 09/19/20 1133 09/20/20 0500  NA 139 138  K 4.2 4.0  CL 104 101  CO2 25 28  GLUCOSE 112* 112*  BUN 10 13  CREATININE 0.82 0.93  CALCIUM 8.6* 8.8*  PROT 6.0*  --   ALBUMIN 2.3*  --   AST 15  --   ALT 27  --   ALKPHOS 92  --   BILITOT 0.7  --   GFRNONAA >60 >60  ANIONGAP 10 9     Cardiac Enzymes: Cardiac Panel (last 3 results) Recent Labs    09/19/20 1133 09/19/20 1455  TROPONINIHS 5 4    BNP (last 3 results) Recent Labs    09/19/20 1135  BNP 149.7*  ProBNP (last 3 results) No results for input(s): PROBNP in the last 8760 hours.   DDimer No results for input(s): DDIMER in the last 168 hours.   Hemoglobin A1c: No results found for: HGBA1C, MPG  TSH  Recent Labs    08/27/20 1430  TSH 0.457    Lipid Panel No results found for: CHOL, TRIG, HDL, CHOLHDL, VLDL, LDLCALC, LDLDIRECT  Imaging: DG Chest Port 1 View  Result Date: 09/20/2020 CLINICAL DATA:  Left pleural effusion status post thoracentesis EXAM: PORTABLE CHEST 1 VIEW  COMPARISON:  09/19/2020 FINDINGS: Stable cardiomegaly. Slight interval decrease in left-sided pleural effusion, now moderate. Hazy left basilar opacity. Right lung remains clear. No pneumothorax. IMPRESSION: Interval decrease in left-sided pleural effusion, now moderate. No pneumothorax. Electronically Signed   By: Duanne GuessNicholas  Plundo D.O.   On: 09/20/2020 08:29   DG Chest Port 1 View  Result Date: 09/19/2020 CLINICAL DATA:  Sepsis EXAM: PORTABLE CHEST 1 VIEW COMPARISON:  08/27/2020 FINDINGS: Cardiac silhouette appears enlarged, although is partially obscured by a moderate to large left-sided pleural effusion. Associated hazy left basilar opacity. Right lung is clear. No pneumothorax. IMPRESSION: 1. Moderate to large left-sided pleural effusion, new from prior. Associated hazy left basilar opacity may reflect atelectasis or pneumonia. 2. Cardiomegaly. An underlying pericardial effusion would be difficult to exclude. Electronically Signed   By: Duanne GuessNicholas  Plundo D.O.   On: 09/19/2020 12:18   IR THORACENTESIS ASP PLEURAL SPACE W/IMG GUIDE  Result Date: 09/20/2020 INDICATION: Patient with history of HF, atrial fibrillation/flutter, dyspnea, and left pleural effusion. Request is made for diagnostic and therapeutic left thoracentesis. EXAM: ULTRASOUND GUIDED DIAGNOSTIC AND THERAPEUTIC LEFT THORACENTESIS MEDICATIONS: 10 mL 1% lidocaine COMPLICATIONS: None immediate. PROCEDURE: An ultrasound guided thoracentesis was thoroughly discussed with the patient and questions answered. The benefits, risks, alternatives and complications were also discussed. The patient understands and wishes to proceed with the procedure. Written consent was obtained. Ultrasound was performed to localize and mark an adequate pocket of fluid in the left chest. The area was then prepped and draped in the normal sterile fashion. 1% Lidocaine was used for local anesthesia. Under ultrasound guidance a 6 Fr Safe-T-Centesis catheter was introduced.  Thoracentesis was performed. The catheter was removed and a dressing applied. FINDINGS: A total of approximately 1.5 L of hazy amber fluid was removed. Procedure was stopped after 1.5 L secondary to patient's hypotension (73/63, 92/74) - catheter was removed and patient was laid back in bed with BP improvement (106/87). Patient remained asymptomatic throughout procedure (denied dizziness, vision changes). Samples were sent to the laboratory as requested by the clinical team. IMPRESSION: Successful ultrasound guided left thoracentesis yielding 1.5 L of pleural fluid. Read by: Elwin MochaAlexandra Louk, PA-C Electronically Signed   By: Marliss Cootsylan  Suttle MD   On: 09/20/2020 08:35   RADIOLOGY: CT Angio Chest PE W and/or Wo Contrast. Date: 08/27/2020 CLINICAL DATA: Shortness of breath and chest pain  FINDINGS: Cardiovascular: Satisfactory opacification of the pulmonary arteries to the segmental level. There are filling defects in segmental branches of bilateral upper and right middle lobes. Additional suspected filling defects within subsegmental branches. Left atrial enlargement. No evidence of right heart strain. No pericardial effusion. Mediastinum/Nodes: There are no enlarged lymph nodes identified. Thyroid and esophagus are unremarkable. Lungs/Pleura: Patchy areas of atelectasis. No pleural effusion or pneumothorax. Upper Abdomen: No acute abnormality. Musculoskeletal: No acute osseous abnormality. Review of the MIP images confirms the above findings.  IMPRESSION: Acute segmental and subsegmental pulmonary emboli bilaterally. No evidence of right heart strain.  Patchy bilateral atelectasis.  Cardiac database: EKG: 09/19/2020: Atrial flutter, 158 bpm, left axis deviation, poor R wave progression, without underlying injury pattern.  Echocardiogram: 08/28/2020:  1. Left ventricular ejection fraction, by estimation, is 50 to 55%. The left ventricle has low normal function. The left ventricle has no regional wall motion  abnormalities. Left ventricular diastolic parameters were normal. 2. Right ventricular systolic function is normal. The right ventricular size is normal. There is moderately elevated pulmonary artery systolic pressure. The estimated right ventricular systolic pressure is 40.6 mmHg. 3. The mitral valve is normal in structure. Trivial mitral valve regurgitation. No evidence of mitral stenosis. 4. The aortic valve is normal in structure. Aortic valve regurgitation is not visualized. 5. The inferior vena cava is dilated in size with <50% respiratory variability, suggesting right atrial pressure of 15 mmHg.  Scheduled Meds: . allopurinol  100 mg Oral q AM  . amiodarone  200 mg Oral Daily  . atorvastatin  10 mg Oral Daily  . azithromycin  500 mg Oral Daily  . furosemide  60 mg Intravenous Q12H  . metolazone  2.5 mg Oral Daily  . metoprolol tartrate  50 mg Oral BID    Continuous Infusions: . cefTRIAXone (ROCEPHIN)  IV Stopped (09/20/20 1458)  . diltiazem (CARDIZEM) infusion 15 mg/hr (09/20/20 1503)  . heparin 2,000 Units/hr (09/20/20 1502)    PRN Meds: levalbuterol, oxyCODONE-acetaminophen   IMPRESSION & RECOMMENDATIONS: Azeez Dunker is a 64 y.o. male whose past medical history and cardiac risk factors include: Pulmonary embolism, atrial flutter/fibrillation, dyslipidemia, hypertension, premature CAD in the family, smokes cigars, obesity due to excess calories.  Shortness of breath: Multifactorial (recent PE, atrial flutter/fibrillation, A/C HFpEF, Pneumonia and moderate/large pleural effusion): Improving.   Symptomatic atrial flutter with rapid ventricular rate:  Continue Cardizem drip.  Restart home dose of beta-blocker therapy, Lopressor 50 mg p.o. twice daily.  Transition from IV amiodarone to p.o. amiodarone 200 mg p.o. daily (to minimize IV fluids).   Continue telemetry.  Patient is maintaining his blood pressures.  Check EKG in the morning  Long-term use of  antiarrhythmic medications:  Patient was on Multaq prior to admission.  Given the asymptomatic atrial flutter with rapid ventricular rate transition to IV amiodarone.  AST 15, ALT 27 as of 09/19/2020.  TSH 0.457 as of 08/27/2020  Recheck TSH  If patient continues to be on amiodarone once discharged he will need long-term follow-up.  Acute heart failure with preserved EF, stage C NYHA class III/IV: Most likely exacerbated by his underlying symptomatic atrial flutter, pneumonia, moderate/large pleural effusion): Improving  Since thoracentesis patient states that his shortness of breath has improved.  In addition, his net fluid output is 4.8 L since admission  Continue IV Lasix  Start low-dose metolazone 2.5 mg p.o. daily  Strict I's and O's and daily weights  Monitor blood pressure  We will check a limited 2D echo.  Check BNP in the morning  Continue IV Cardizem drip  Restart home dose of Lopressor 50 mg p.o. twice daily.  May consider the addition of digoxin if patient has soft blood pressures.  Moderate to large pleural effusion status post thoracentesis:  Management per primary team.  Recommend incentive spirometry at bedtime to help facilitate lung parenchyma and to avoid further atelectasis and fluid collection.  Pulmonary embolism:   Diagnosed during prior admission; CT Scan 08/27/2020 Acute segmental and subsegmental pulmonary emboli bilaterally.  Currently on IV heparin.    Management primary team.  Secondary Diagnosis: Dyslipidemia: Continue statin  therapy.  Follow lipids.  Family history of premature CAD: Ischemic evaluation as outpatient unless if change in clinical status during this hospitalization.  Cigar smoking: Educated on the importance of complete cessation given his comorbidities underlying shortness of breath.  Patient's questions and concerns were addressed to his satisfaction. He voices understanding of the instructions provided  during this encounter.   This note was created using a voice recognition software as a result there may be grammatical errors inadvertently enclosed that do not reflect the nature of this encounter. Every attempt is made to correct such errors.  Total time spent: 35 minutes discussing disease management with the patient, plan of care with patient and nursing staff, ordering tests, review of diagnostic testing, formulating a plan and coordination of care.  Tessa Lerner, DO, [facility]. PA Office: [phone] 09/20/2020, 6:40 PM

## 2020-09-20 NOTE — ED Notes (Signed)
Spoke with attending and agreed patient can be transported to IR. Nurse escorted pt to IR suite

## 2020-09-20 NOTE — ED Notes (Signed)
Lunch Tray Ordered @ 1034. 

## 2020-09-20 NOTE — Progress Notes (Addendum)
ANTICOAGULATION CONSULT NOTE - Follow Up Consult  Pharmacy Consult for IV Heparin Indication: pulmonary embolus, atrial fibrillation  No Known Allergies  Patient Measurements: Total Body Weight: 108 kg Height: 70 inches Heparin Dosing Weight: 96.3 kg  Vital Signs: Temp: 99.2 F (37.3 C) (11/11 1511) Temp Source: Oral (11/11 1511) BP: 116/78 (11/11 1511) Pulse Rate: 140 (11/11 1511)  Labs: Recent Labs    09/19/20 1133 09/19/20 1133 09/19/20 1455 09/19/20 1945 09/19/20 1945 09/20/20 0400 09/20/20 0500 09/20/20 1200 09/20/20 1743  HGB 12.4*  --   --   --   --  12.3*  --   --   --   HCT 39.8  --   --   --   --  39.5  --   --   --   PLT 366  --   --   --   --  333  --   --   --   APTT 31   < >  --  48*   < > 87*  --  47* 63*  LABPROT 16.8*  --   --   --   --   --   --   --   --   INR 1.4*  --   --   --   --   --   --   --   --   HEPARINUNFRC  --   --   --  0.53  --  0.87*  --   --  0.48  CREATININE 0.82  --   --   --   --   --  0.93  --   --   TROPONINIHS 5  --  4  --   --   --   --   --   --    < > = values in this interval not displayed.    Estimated Creatinine Clearance: 98.4 mL/min (by C-G formula based on SCr of 0.93 mg/dL).   Assessment: 64 yr old male presented with SOB; pt is on apixaban PTA for afib/recent PE (last dose taken 11/9 evening).  Pharmacy was consulted to dose heparin for PE. Due to recent apixaban exposure, will monitor anticoagulation using aPTT until aPTT and heparin levels correlate.  APTT trending up but remains slightly sub-therapeutic.  Heparin level is trending down, indicating that Eliquis is being cleared.  No issue with heparin infusion nor bleeding per RN.  Goal of Therapy:  Heparin level 0.3-0.7 units/ml aPTT 66-102 seconds Monitor platelets by anticoagulation protocol: Yes   Plan:  Increase heparin gtt to 2150 units/hr F/U AM labs  Aqua Denslow D. Laney Potash, PharmD, BCPS, BCCCP 09/20/2020, 6:53  PM  =========================================  Addendum: Transition to Lovenox to minimize volume, D/C heparin when Lovenox is administered Lovenox 110mg  SQ Q12H CBC Q72H while on Lovenox  Jelani Trueba D. 12-17-1982, PharmD, BCPS, BCCCP 09/20/2020, 8:00 PM

## 2020-09-20 NOTE — ED Notes (Signed)
Dr Caleb Popp paged as pt remains tachycardic after IR and with all medications infusing

## 2020-09-20 NOTE — Progress Notes (Signed)
   09/20/20 1511  Assess: MEWS Score  Temp 99.2 F (37.3 C)  BP 116/78  Pulse Rate (!) 140  ECG Heart Rate (!) 146  Resp 18  Level of Consciousness Alert  SpO2 94 %  O2 Device Nasal Cannula  O2 Flow Rate (L/min) 3 L/min  Assess: MEWS Score  MEWS Temp 0  MEWS Systolic 0  MEWS Pulse 3  MEWS RR 0  MEWS LOC 0  MEWS Score 3  MEWS Score Color Yellow  Assess: if the MEWS score is Yellow or Red  Were vital signs taken at a resting state? Yes  Focused Assessment No change from prior assessment  Early Detection of Sepsis Score *See Row Information* High  MEWS guidelines implemented *See Row Information* No, previously yellow, continue vital signs every 4 hours  Treat  MEWS Interventions Administered scheduled meds/treatments  Pain Scale 0-10  Pain Score 0   Pt arrived to unit with elevated HR.  Heparin gtt, amiodarone gtt, diltiazem gtt currently running.  Charge nurse aware of pt's HR.  Pt denies chest pain.  Oxygen saturation of 94% [place] 3L.   Call bell within reach. Bed alarm in place.

## 2020-09-20 NOTE — Progress Notes (Signed)
°   09/20/20 1700  Incentive Spirometry  Incentive Spirometry Goal (mL) (RN or RT) 1000 mL  Incentive Spirometry - Achieved (mL) (RN, NT, or RT) 750 mL  Incentive Spirometry - # of Times (RN or NT) 10  Incentive Spirometry Effort (RN) Needs reinforcement   Patient was educated in the importance of using incentive spirometry 10 times/hour . Patient verbalizes understanding.

## 2020-09-20 NOTE — ED Notes (Signed)
Pt returned from IR.

## 2020-09-20 NOTE — Progress Notes (Signed)
ANTICOAGULATION CONSULT NOTE - Follow Up Consult  Pharmacy Consult for IV Heparin Indication: pulmonary embolus, atrial fibrillation  No Known Allergies  Patient Measurements: Total Body Weight: 108 kg Height: 70 inches Heparin Dosing Weight: 96.3 kg  Vital Signs: Temp: 97.6 F (36.4 C) (11/11 0401) Temp Source: Oral (11/11 0401) BP: 112/84 (11/11 0515) Pulse Rate: 146 (11/11 0515)  Labs: Recent Labs    09/19/20 1133 09/19/20 1455 09/19/20 1945 09/20/20 0400  HGB 12.4*  --   --  12.3*  HCT 39.8  --   --  39.5  PLT 366  --   --  333  APTT 31  --  48* 87*  LABPROT 16.8*  --   --   --   INR 1.4*  --   --   --   HEPARINUNFRC  --   --  0.53 0.87*  CREATININE 0.82  --   --   --   TROPONINIHS 5 4  --   --     Estimated Creatinine Clearance: 112 mL/min (by C-G formula based on SCr of 0.82 mg/dL).   Assessment: 64 yr old male presented with SOB; pt is on apixaban PTA for afib/recent PE (last dose taken 11/9 evening).  Pharmacy was consulted to dose heparin for PE. Due to recent apixaban exposure, will monitor anticoagulation using aPTT until aPTT and heparin levels correlate.  Heparin level 0.87 (being affected by apixaban), PTT 87 sec (therapeutic) on gtt at 1850 units/hr. No bleeding noted.  Goal of Therapy:  Heparin level 0.3-0.7 units/ml aPTT 66-102 seconds Monitor platelets by anticoagulation protocol: Yes   Plan:  Continue heparin infusion at 1850 units/hr F/u 6 hr confirm PTT  Christoper Fabian, PharmD, BCPS Please see amion for complete clinical pharmacist phone list 09/20/2020 5:42 AM

## 2020-09-20 NOTE — Progress Notes (Signed)
ANTICOAGULATION CONSULT NOTE - Follow Up Consult  Pharmacy Consult for IV Heparin Indication: pulmonary embolus, atrial fibrillation  No Known Allergies  Patient Measurements: Total Body Weight: 108 kg Height: 70 inches Heparin Dosing Weight: 96.3 kg  Vital Signs: Temp: 97.7 F (36.5 C) (11/11 0841) Temp Source: Axillary (11/11 0841) BP: 108/91 (11/11 1245) Pulse Rate: 92 (11/11 1245)  Labs: Recent Labs    09/19/20 1133 09/19/20 1133 09/19/20 1455 09/19/20 1945 09/20/20 0400 09/20/20 0500 09/20/20 1200  HGB 12.4*  --   --   --  12.3*  --   --   HCT 39.8  --   --   --  39.5  --   --   PLT 366  --   --   --  333  --   --   APTT 31   < >  --  48* 87*  --  47*  LABPROT 16.8*  --   --   --   --   --   --   INR 1.4*  --   --   --   --   --   --   HEPARINUNFRC  --   --   --  0.53 0.87*  --   --   CREATININE 0.82  --   --   --   --  0.93  --   TROPONINIHS 5  --  4  --   --   --   --    < > = values in this interval not displayed.    Estimated Creatinine Clearance: 98.7 mL/min (by C-G formula based on SCr of 0.93 mg/dL).   Assessment: 64 yr old male presented with SOB; pt is on apixaban PTA for afib/recent PE (last dose taken 11/9 evening).  Pharmacy was consulted to dose heparin for PE. Due to recent apixaban exposure, will monitor anticoagulation using aPTT until aPTT and heparin levels correlate.  Follow up aPTT subtherapeutic at 47 seconds on 1850 units/hr, no infusion issues and rate confirmed at bedside.     Goal of Therapy:  Heparin level 0.3-0.7 units/ml aPTT 66-102 seconds Monitor platelets by anticoagulation protocol: Yes   Plan:  Increase heparin gtt to 2000 units/hr F/u 6 hour aPTT/HL F/u [facility] plan and transition to PO  Daylene Posey, PharmD Clinical Pharmacist ED Pharmacist Phone # [phone] 09/20/2020 12:58 PM

## 2020-09-20 NOTE — Progress Notes (Signed)
   09/20/20 1837  Mobility  Activity Ambulated to bathroom;Ambulated in room  Level of Assistance Minimal assist, patient does 75% or more  Assistive Device None  Distance Ambulated (ft) 20 ft  Mobility Response Tolerated fair  Mobility performed by Nurse

## 2020-09-20 NOTE — Procedures (Signed)
PROCEDURE SUMMARY:  Successful image-guided left thoracentesis. Yielded 1.5 liters of hazy amber fluid. Procedure was stopped at 1.5 liters secondary to patient's hypotension (73/63, 92/74)- patient remained asymptomatic throughout this. Patient tolerated procedure well. No immediate complications. EBL = 0 mL.  Specimen was sent for labs. CXR ordered.  Please see imaging section of Epic for full dictation.   Gordy Councilman Spencer Peterkin PA-C 09/20/2020 8:26 AM

## 2020-09-20 NOTE — ED Notes (Signed)
Spoke with Dr Caleb Popp and reviewed pt status and current infusing medications.Per Dr Caleb Popp cardiology was consulted and no change in medications at this time as pt remains stable. Both agreed on plan of care and will continue to monitor pt.

## 2020-09-21 ENCOUNTER — Inpatient Hospital Stay (HOSPITAL_COMMUNITY): Payer: Federal, State, Local not specified - PPO

## 2020-09-21 ENCOUNTER — Ambulatory Visit: Payer: Federal, State, Local not specified - PPO | Admitting: Student

## 2020-09-21 DIAGNOSIS — I4892 Unspecified atrial flutter: Secondary | ICD-10-CM | POA: Diagnosis not present

## 2020-09-21 DIAGNOSIS — J189 Pneumonia, unspecified organism: Secondary | ICD-10-CM

## 2020-09-21 LAB — PH, BODY FLUID: pH, Body Fluid: 7.5

## 2020-09-21 LAB — MAGNESIUM: Magnesium: 2.1 mg/dL (ref 1.7–2.4)

## 2020-09-21 LAB — BASIC METABOLIC PANEL
Anion gap: 9 (ref 5–15)
BUN: 14 mg/dL (ref 8–23)
CO2: 32 mmol/L (ref 22–32)
Calcium: 8.9 mg/dL (ref 8.9–10.3)
Chloride: 97 mmol/L — ABNORMAL LOW (ref 98–111)
Creatinine, Ser: 0.88 mg/dL (ref 0.61–1.24)
GFR, Estimated: >60 mL/min (ref >60)
Glucose, Bld: 90 mg/dL (ref 70–99)
Potassium: 3.5 mmol/L (ref 3.5–5.1)
Sodium: 138 mmol/L (ref 135–145)

## 2020-09-21 LAB — PATHOLOGIST SMEAR REVIEW

## 2020-09-21 LAB — URINE CULTURE: Culture: 4000 — AB

## 2020-09-21 LAB — PROCALCITONIN: Procalcitonin: <0.1 ng/mL

## 2020-09-21 LAB — BRAIN NATRIURETIC PEPTIDE: B Natriuretic Peptide: 86.4 pg/mL (ref 0.0–100.0)

## 2020-09-21 LAB — TSH: TSH: 2.577 u[IU]/mL (ref 0.350–4.500)

## 2020-09-21 LAB — LACTATE DEHYDROGENASE: LDH: 117 U/L (ref 98–192)

## 2020-09-21 MED ORDER — APIXABAN 5 MG PO TABS
5.0000 mg | ORAL_TABLET | Freq: Two times a day (BID) | ORAL | Status: DC
  Administered 2020-09-21 – 2020-09-26 (×10): 5 mg via ORAL
  Filled 2020-09-21 (×10): qty 1

## 2020-09-21 MED ORDER — SODIUM CHLORIDE 0.9 % IV SOLN
2.0000 g | INTRAVENOUS | Status: DC
  Administered 2020-09-21 – 2020-09-25 (×5): 2 g via INTRAVENOUS
  Filled 2020-09-21 (×5): qty 20

## 2020-09-21 NOTE — Progress Notes (Signed)
Pt's  HR went down to the 90's and ECG later confirmed that pt was in NSR, MD notified and aware, will continue to monitor, Thanks Lavonda Jumbo RN.

## 2020-09-21 NOTE — Progress Notes (Signed)
  Echocardiogram 2D Echocardiogram has been performed.  Delcie Roch 09/21/2020, 8:51 AM

## 2020-09-21 NOTE — Progress Notes (Signed)
PROGRESS NOTE    Carl [name]  INO:[id number] DOB: [birth year] DOA: 09/19/2020 PCP: Heywood Bene, PA-C   Brief Narrative: Carl [name] is a 64 y.o. male with a history of atrial fibrillation, PE, obesity, diastolic heart failure. He presented secondary to dyspnea and was found to have atrial flutter with RVR, left pleural effusion, respiratory failure and evidence of acute heart failure. He has been started on diltiazem/amiodarone drips, IV lasix, heparin drip.   Assessment & Plan:   Principal Problem:   Atrial flutter with rapid ventricular response (HCC) Active Problems:   Primary hypertension   Typical atrial flutter (HCC)   Acute pulmonary embolism (HCC)   Dyslipidemia   Class 1 obesity   Acute on chronic diastolic CHF (congestive heart failure) (HCC)   Gout   Pleural effusion on left   Unspecified atrial fibrillation ([place])   Community acquired pneumonia   Dyspnea   Long term current use of antiarrhythmic drug   Atrial flutter with rapid ventricular response Patient states he has been adherent with outpatient regimen except he has not been taking dronedarone secondary to dyspnea. In setting of likely pneumonia. Transitioned to amiodarone PO on 11/11. Patient converted to NSR on 11/11 -Cardiology recommendations: Continue diltiazem drip, metoprolol PO, amiodarone PO  Acute on chronic diastolic heart failure In setting of above. Associated pleural effusion. -Continue Lasix -Cardiology recommendations: added metolazone  Acute respiratory failure with hypoxia Most likely secondary to pleural effusion. Recent PE could be contributing but not as likely. Placed on 3 L of oxygen. Documented hypoxia with SpO2 of 89%. -Continue oxygen and wean to room air as able -Ambulate with pulse oximetry  Pleural effusion Left sided. In setting of rapid atrial flutter and acute heart failure. Thoracentesis ordered and performed on 11/11. No obvious evidence of infection. At  least one of light's criteria met. No serum LDH available. -Follow-up pleural fluid culture -Order fluid cytology  Sepsis Present on admission with possible pneumonia as source. Normal lactic acid. Blood and urine cultures obtained on admission. Procalcitonin undetectable. Urine culture significant for 4000 colonies of E. Coli -Follow up blood cultures -Procalcitonin  Community acquired pneumonia Patient with fever (102.8 F prior to admission), associated leukocytosis and x-ray findings consistent with possible pneumonia. Patient was started on treatment as an outpatient with Augmentin PO. Started on Ceftriaxone/azithromycin on admission. Leukocytosis improved today.  -Continue Ceftriaxone/doxycycline  Acute pulmonary embolism Patient is currently being treated with Eliquis as an outpatient. Transitioned to heparin drip inpatient and now on Lovenox per cardiology -Back to Eliquis per cardiology  Primary hypertension On diltiazem and metoprolol  Hyperlipidemia -Continue Lipitor  Tobacco use Counseled.  Morbid obesity Body mass index is 33.33 kg/m.   DVT prophylaxis: Heparin drip Code Status:   Code Status: Full Code Family Communication: None at bedside Disposition Plan: Progressive unit. Likely discharge home in 1-2 days pending cardiology recommendations, wean to room air if able and transition to oral antibiotics.   Consultants:   Cardiology  Procedures:   TRANSTHORACIC ECHOCARDIOGRAM (pending)  Antimicrobials:  Ceftriaxone  Azithromycin    Subjective: No dyspnea or chest pain.  Objective: Vitals:   09/21/20 0100 09/21/20 0200 09/21/20 0300 09/21/20 0514  BP: 104/74 98/75 104/72 107/76  Pulse: 73 73 68 87  Resp: _0 Temp:    98.9 F (37.2 C)  TempSrc:    Oral  SpO2: 94% 94% 93% 96%  Weight:    105.4 kg  Height:  Intake/Output Summary (Last 24 hours) at 09/21/2020 0751 Last data filed at 09/21/2020 0731 Gross per 24 hour  Intake  1298.87 ml  Output 4975 ml  Net -3676.13 ml   Filed Weights   09/20/20 1511 09/21/20 0514  Weight: 107.3 kg 105.4 kg    Examination:  General exam: Appears calm and comfortable Respiratory system: diminished on left compared to right with rales at bases. Respiratory effort normal. Cardiovascular system: S1 & S2 heard, RRR. No murmurs, rubs, gallops or clicks. Gastrointestinal system: Abdomen is protuberant, soft and nontender. No organomegaly or masses felt. Normal bowel sounds heard. Central nervous system: Alert and oriented. No focal neurological deficits. Musculoskeletal: No calf tenderness Skin: No cyanosis. No rashes Psychiatry: Judgement and insight appear normal. Mood & affect appropriate.      Data Reviewed: I have personally reviewed following labs and imaging studies  CBC Lab Results  Component Value Date   WBC 12.6 (H) 09/20/2020   RBC 3.77 (L) 09/20/2020   HGB 12.3 (L) 09/20/2020   HCT 39.5 09/20/2020   MCV 104.8 (H) 09/20/2020   MCH 32.6 09/20/2020   PLT 333 09/20/2020   MCHC 31.1 09/20/2020   RDW 12.8 09/20/2020   LYMPHSABS 0.7 09/19/2020   MONOABS 1.2 (H) 09/19/2020   EOSABS 0.0 09/19/2020   BASOSABS 0.1 84/66/5993     Last metabolic panel Lab Results  Component Value Date   NA 138 09/21/2020   K 3.5 09/21/2020   CL 97 (L) 09/21/2020   CO2 32 09/21/2020   BUN 14 09/21/2020   CREATININE 0.88 09/21/2020   GLUCOSE 90 09/21/2020   GFRNONAA >60 09/21/2020   CALCIUM 8.9 09/21/2020   PHOS 2.4 (L) 08/30/2020   PROT 6.0 (L) 09/19/2020   ALBUMIN 2.3 (L) 09/19/2020   BILITOT 0.7 09/19/2020   ALKPHOS 92 09/19/2020   AST 15 09/19/2020   ALT 27 09/19/2020   ANIONGAP 9 09/21/2020    CBG (last 3)  No results for input(s): GLUCAP in the last 72 hours.   GFR: Estimated Creatinine Clearance: 103.2 mL/min (by C-G formula based on SCr of 0.88 mg/dL).  Coagulation Profile: Recent Labs  Lab 09/19/20 1133  INR 1.4*    Recent Results (from the past  240 hour(s))  Blood culture (routine single)     Status: None (Preliminary result)   Collection Time: 09/19/20 11:33 AM   Specimen: BLOOD  Result Value Ref Range Status   Specimen Description BLOOD BLOOD RIGHT HAND  Final   Special Requests   Final    BOTTLES DRAWN AEROBIC AND ANAEROBIC Blood Culture adequate volume   Culture   Final    NO GROWTH < 24 HOURS Performed at [facility], [address] [place], [place] [address]    Report Status PENDING  Incomplete  Urine culture     Status: Abnormal   Collection Time: 09/19/20 11:33 AM   Specimen: In/Out Cath Urine  Result Value Ref Range Status   Specimen Description IN/OUT CATH URINE  Final   Special Requests   Final    NONE Performed at [facility], [place] [address] [place], [place] [address]    Culture 4,000 COLONIES/mL ESCHERICHIA COLI (A)  Final   Report Status 09/21/2020 FINAL  Final   Organism ID, Bacteria ESCHERICHIA COLI (A)  Final      Susceptibility   Escherichia coli - MIC*    AMPICILLIN 8 SENSITIVE Sensitive     CEFAZOLIN <=4 SENSITIVE Sensitive  CEFEPIME <=0.12 SENSITIVE Sensitive     CEFTRIAXONE <=0.25 SENSITIVE Sensitive     CIPROFLOXACIN <=0.25 SENSITIVE Sensitive     GENTAMICIN <=1 SENSITIVE Sensitive     IMIPENEM <=0.25 SENSITIVE Sensitive     NITROFURANTOIN <=16 SENSITIVE Sensitive     TRIMETH/SULFA <=20 SENSITIVE Sensitive     AMPICILLIN/SULBACTAM <=2 SENSITIVE Sensitive     PIP/TAZO <=4 SENSITIVE Sensitive     * 4,000 COLONIES/mL ESCHERICHIA COLI  Culture, blood (single)     Status: None (Preliminary result)   Collection Time: 09/19/20 12:15 PM   Specimen: BLOOD LEFT FOREARM  Result Value Ref Range Status   Specimen Description BLOOD LEFT FOREARM  Final   Special Requests   Final    BOTTLES DRAWN AEROBIC AND ANAEROBIC Blood Culture adequate volume   Culture   Final    NO GROWTH < 24 HOURS Performed at [facility], [place] [address] [place], [place] [address]    Report Status  PENDING  Incomplete  Respiratory Panel by RT PCR (Flu A&B, Covid) - Nasopharyngeal Swab     Status: None   Collection Time: 09/19/20  1:11 PM   Specimen: Nasopharyngeal Swab  Result Value Ref Range Status   SARS Coronavirus 2 by RT PCR NEGATIVE NEGATIVE Final    Comment: (NOTE) SARS-CoV-2 target nucleic acids are NOT DETECTED.  The SARS-CoV-2 RNA is generally detectable in upper respiratoy specimens during the acute phase of infection. The lowest concentration of SARS-CoV-2 viral copies this assay can detect is 131 copies/mL. A negative result does not preclude SARS-Cov-2 infection and should not be used as the sole basis for treatment or other patient management decisions. A negative result may occur with  improper specimen collection/handling, submission of specimen other than nasopharyngeal swab, presence of viral mutation(s) within the areas targeted by this assay, and inadequate number of viral copies (<131 copies/mL). A negative result must be combined with clinical observations, patient history, and epidemiological information. The expected result is Negative.  Fact Sheet for Patients:  PinkCheek.be  Fact Sheet for Healthcare Providers:  GravelBags.it  This test is no t yet approved or cleared by the [place] FDA and  has been authorized for detection and/or diagnosis of SARS-CoV-2 by FDA under an Emergency Use Authorization (EUA). This EUA will remain  in effect (meaning this test can be used) for the duration of the COVID-19 declaration under Section 564(b)(1) of the Act, 21 U.S.C. section 360bbb-3(b)(1), unless the authorization is terminated or revoked sooner.     Influenza A by PCR NEGATIVE NEGATIVE Final   Influenza B by PCR NEGATIVE NEGATIVE Final    Comment: (NOTE) The Xpert Xpress SARS-CoV-2/FLU/RSV assay is intended as an aid in  the diagnosis of influenza from Nasopharyngeal swab specimens and    should not be used as a sole basis for treatment. Nasal washings and  aspirates are unacceptable for Xpert Xpress SARS-CoV-2/FLU/RSV  testing.  Fact Sheet for Patients: PinkCheek.be  Fact Sheet for Healthcare Providers: GravelBags.it  This test is not yet approved or cleared by the [place] FDA and  has been authorized for detection and/or diagnosis of SARS-CoV-2 by  FDA under an Emergency Use Authorization (EUA). This EUA will remain  in effect (meaning this test can be used) for the duration of the  Covid-19 declaration under Section 564(b)(1) of the Act, 21  U.S.C. section 360bbb-3(b)(1), unless the authorization is  terminated or revoked. Performed at [facility], [place] [address], [place], [place]  [address]   Gram stain     Status: None   Collection Time: 09/20/20  8:35 AM   Specimen: Pleura  Result Value Ref Range Status   Specimen Description PLEURAL FLUID  Final   Special Requests LEFT LUNG  Final   Gram Stain   Final    WBC PRESENT,BOTH PMN AND MONONUCLEAR NO ORGANISMS SEEN CYTOSPIN SMEAR Performed at [facility], [address] [place], [place] [address]    Report Status 09/20/2020 FINAL  Final        Radiology Studies: DG Chest Port 1 View  Result Date: 09/20/2020 CLINICAL DATA:  Left pleural effusion status post thoracentesis EXAM: PORTABLE CHEST 1 VIEW COMPARISON:  09/19/2020 FINDINGS: Stable cardiomegaly. Slight interval decrease in left-sided pleural effusion, now moderate. Hazy left basilar opacity. Right lung remains clear. No pneumothorax. IMPRESSION: Interval decrease in left-sided pleural effusion, now moderate. No pneumothorax. Electronically Signed   By: Davina Poke D.O.   On: 09/20/2020 08:29   DG Chest Port 1 View  Result Date: 09/19/2020 CLINICAL DATA:  Sepsis EXAM: PORTABLE CHEST 1 VIEW COMPARISON:  08/27/2020 FINDINGS: Cardiac silhouette appears enlarged,  although is partially obscured by a moderate to large left-sided pleural effusion. Associated hazy left basilar opacity. Right lung is clear. No pneumothorax. IMPRESSION: 1. Moderate to large left-sided pleural effusion, new from prior. Associated hazy left basilar opacity may reflect atelectasis or pneumonia. 2. Cardiomegaly. An underlying pericardial effusion would be difficult to exclude. Electronically Signed   By: Davina Poke D.O.   On: 09/19/2020 12:18   IR THORACENTESIS ASP PLEURAL SPACE W/IMG GUIDE  Result Date: 09/20/2020 INDICATION: Patient with history of HF, atrial fibrillation/flutter, dyspnea, and left pleural effusion. Request is made for diagnostic and therapeutic left thoracentesis. EXAM: ULTRASOUND GUIDED DIAGNOSTIC AND THERAPEUTIC LEFT THORACENTESIS MEDICATIONS: 10 mL 1% lidocaine COMPLICATIONS: None immediate. PROCEDURE: An ultrasound guided thoracentesis was thoroughly discussed with the patient and questions answered. The benefits, risks, alternatives and complications were also discussed. The patient understands and wishes to proceed with the procedure. Written consent was obtained. Ultrasound was performed to localize and mark an adequate pocket of fluid in the left chest. The area was then prepped and draped in the normal sterile fashion. 1% Lidocaine was used for local anesthesia. Under ultrasound guidance a 6 Fr Safe-T-Centesis catheter was introduced. Thoracentesis was performed. The catheter was removed and a dressing applied. FINDINGS: A total of approximately 1.5 L of hazy amber fluid was removed. Procedure was stopped after 1.5 L secondary to patient's hypotension (73/63, 92/74) - catheter was removed and patient was laid back in bed with BP improvement (106/87). Patient remained asymptomatic throughout procedure (denied dizziness, vision changes). Samples were sent to the laboratory as requested by the clinical team. IMPRESSION: Successful ultrasound guided left  thoracentesis yielding 1.5 L of pleural fluid. Read by: Earley Abide, PA-C Electronically Signed   By: Ruthann Cancer MD   On: 09/20/2020 08:35        Scheduled Meds: . allopurinol  100 mg Oral q AM  . amiodarone  200 mg Oral Daily  . atorvastatin  10 mg Oral Daily  . doxycycline  100 mg Oral Q12H  . enoxaparin (LOVENOX) injection  110 mg Subcutaneous Q12H  . furosemide  60 mg Intravenous Q12H  . metolazone  2.5 mg Oral Daily  . metoprolol tartrate  50 mg Oral BID   Continuous Infusions: . cefTRIAXone (ROCEPHIN)  IV Stopped (09/20/20 1458)  . diltiazem (CARDIZEM) infusion 5 mg/hr (09/21/20  0677)     LOS: 2 days     Cordelia Poche, MD [facility] 09/21/2020, 7:51 AM  If 7PM-7AM, please contact night-coverage [email or web address]

## 2020-09-21 NOTE — Progress Notes (Signed)
ANTICOAGULATION CONSULT NOTE - Follow Up Consult  Pharmacy Consult for IV Heparin>enoxaparin > apixaban Indication: pulmonary embolus -recent 10/21 / atrial fibrillation  No Known Allergies  Patient Measurements: Total Body Weight: 108 kg Height: 70 inches Heparin Dosing Weight: 96.3 kg  Vital Signs: Temp: 98.4 F (36.9 C) (11/12 1133) Temp Source: Oral (11/12 1133) BP: 97/67 (11/12 1133) Pulse Rate: 73 (11/12 1100)  Labs: Recent Labs    09/19/20 1133 09/19/20 1133 09/19/20 1455 09/19/20 1945 09/19/20 1945 09/20/20 0400 09/20/20 0500 09/20/20 1200 09/20/20 1743 09/21/20 0441  HGB 12.4*  --   --   --   --  12.3*  --   --   --   --   HCT 39.8  --   --   --   --  39.5  --   --   --   --   PLT 366  --   --   --   --  333  --   --   --   --   APTT 31   < >  --  48*   < > 87*  --  47* 63*  --   LABPROT 16.8*  --   --   --   --   --   --   --   --   --   INR 1.4*  --   --   --   --   --   --   --   --   --   HEPARINUNFRC  --   --   --  0.53  --  0.87*  --   --  0.48  --   CREATININE 0.82  --   --   --   --   --  0.93  --   --  0.88  TROPONINIHS 5  --  4  --   --   --   --   --   --   --    < > = values in this interval not displayed.    Estimated Creatinine Clearance: 103.2 mL/min (by C-G formula based on SCr of 0.88 mg/dL).   Assessment: 64 yr old male presented with SOB; pt is on apixaban PTA for afib/recent PE (last dose taken 11/9 evening).  Pharmacy was consulted to dose heparin for PE. Due to recent apixaban exposure, will monitor anticoagulation using aPTT until aPTT and heparin levels correlate.  11/11 patient was transitioned from heparin drip to enoxaparin 1mg /kg q12h Discussed with MD today Cbc stable, renal function stable, apixaban and enoxaparin with similar kinetics Will resume PTA apixaban  Goal of Therapy:  Monitor platelets by anticoagulation protocol: Yes   Plan:  Stop enoxaparin  Resume apixaban 5mg  BID Monitor Scr and s/s bleeding   Pharm.D. CPP, BCPS Clinical Pharmacist [phone] 09/21/2020 2:13 PM

## 2020-09-21 NOTE — Progress Notes (Signed)
Progress Note  Patient Name: Carl [name] Date of Encounter: 09/21/2020  Attending physician: Narda Bonds, MD Primary care provider: Roger Kill, PA-C Primary Cardiologist: Dr. Yates Decamp Consultant:Eryn Krejci Jacinto Halim, MD  Subjective: Carl [name] is a 64 y.o. male with paroxysmal typical atrial flutter with RVR, history of pulmonary embolism when he presented to the ED on 08/27/2020, has had cardioversion but reverted back to atrial flutter, readmitted to the hospital with acute diastolic heart failure, recurrence of atrial flutter with RVR and bilateral pneumonia and pleural effusion.  Underwent left thoracentesis with aspiration of 1.5 L of fluid.  Today this morning he feels well and states that he is about 80% better.  Breathing is also much better.  Denies chest pain.  Denies palpitations. Objective: Vital Signs in the last 24 hours: Temp:  [97.7 F (36.5 C)-99.8 F (37.7 C)] 98.9 F (37.2 C) (11/12 0514) Pulse Rate:  [68-152] 87 (11/12 0514) Resp:  [15-31] 16 (11/12 0514) BP: (85-120)/(47-93) 107/76 (11/12 0514) SpO2:  [89 %-100 %] 96 % (11/12 0514) Weight:  [105.4 kg-107.3 kg] 105.4 kg (11/12 0514)  Intake/Output:  Intake/Output Summary (Last 24 hours) at 09/21/2020 0833 Last data filed at 09/21/2020 0731 Gross per 24 hour  Intake 1298.87 ml  Output 3475 ml  Net -2176.13 ml    Net IO Since Admission: -5,765.31 mL [09/21/20 0833]  Weights:  Filed Weights   09/20/20 1511 09/21/20 0514  Weight: 107.3 kg 105.4 kg   PHYSICAL EXAM: Vitals with BMI 09/21/2020 09/21/2020 09/21/2020  Height - - -  Weight 232 lbs 5 oz - -  BMI 33.33 - -  Systolic 107 104 98  Diastolic 76 72 75  Pulse 87 68 73   Physical Exam Constitutional:      Appearance: He is obese.  Cardiovascular:     Rate and Rhythm: Normal rate and regular rhythm.     Pulses: Normal pulses and intact distal pulses.     Heart sounds: Normal heart sounds. No murmur heard.  No gallop.      Comments:  2+ bilateral pitting below-knee leg edema, no JVD. Pulmonary:     Effort: Pulmonary effort is normal.     Breath sounds: Examination of the right-middle field reveals rales. Examination of the right-lower field reveals decreased breath sounds and rales. Examination of the left-lower field reveals decreased breath sounds and rales. Decreased breath sounds and rales present.  Abdominal:     General: Bowel sounds are normal.     Palpations: Abdomen is soft.     Lab Results: Hematology Recent Labs  Lab 09/19/20 1133 09/20/20 0400  WBC 17.0* 12.6*  RBC 3.88* 3.77*  HGB 12.4* 12.3*  HCT 39.8 39.5  MCV 102.6* 104.8*  MCH 32.0 32.6  MCHC 31.2 31.1  RDW 12.9 12.8  PLT 366 333    Chemistry Recent Labs  Lab 09/19/20 1133 09/20/20 0500 09/21/20 0441  NA 139 138 138  K 4.2 4.0 3.5  CL 104 101 97*  CO2 25 28 32  GLUCOSE 112* 112* 90  BUN 10 13 14   CREATININE 0.82 0.93 0.88  CALCIUM 8.6* 8.8* 8.9  PROT 6.0*  --   --   ALBUMIN 2.3*  --   --   AST 15  --   --   ALT 27  --   --   ALKPHOS 92  --   --   BILITOT 0.7  --   --   GFRNONAA >60 >60 >60  ANIONGAP 10 9  9     Cardiac Enzymes: Cardiac Panel (last 3 results) Recent Labs    09/19/20 1133 09/19/20 1455  TROPONINIHS 5 4    BNP (last 3 results) Recent Labs    09/19/20 1135 09/21/20 0441  BNP 149.7* 86.4    ProBNP (last 3 results) No results for input(s): PROBNP in the last 8760 hours.   DDimer No results for input(s): DDIMER in the last 168 hours.   Hemoglobin A1c: No results found for: HGBA1C, MPG  TSH  Recent Labs    08/27/20 1430 09/21/20 0441  TSH 0.457 2.577    Lipid Panel No results found for: CHOL, TRIG, HDL, CHOLHDL, VLDL, LDLCALC, LDLDIRECT  Imaging: DG Chest Port 1 View  Result Date: 09/20/2020 CLINICAL DATA:  Left pleural effusion status post thoracentesis EXAM: PORTABLE CHEST 1 VIEW COMPARISON:  09/19/2020 FINDINGS: Stable cardiomegaly. Slight interval decrease in left-sided pleural  effusion, now moderate. Hazy left basilar opacity. Right lung remains clear. No pneumothorax. IMPRESSION: Interval decrease in left-sided pleural effusion, now moderate. No pneumothorax. Electronically Signed   By: Duanne GuessNicholas  Plundo D.O.   On: 09/20/2020 08:29   DG Chest Port 1 View  Result Date: 09/19/2020 CLINICAL DATA:  Sepsis EXAM: PORTABLE CHEST 1 VIEW COMPARISON:  08/27/2020 FINDINGS: Cardiac silhouette appears enlarged, although is partially obscured by a moderate to large left-sided pleural effusion. Associated hazy left basilar opacity. Right lung is clear. No pneumothorax. IMPRESSION: 1. Moderate to large left-sided pleural effusion, new from prior. Associated hazy left basilar opacity may reflect atelectasis or pneumonia. 2. Cardiomegaly. An underlying pericardial effusion would be difficult to exclude. Electronically Signed   By: Duanne GuessNicholas  Plundo D.O.   On: 09/19/2020 12:18   IR THORACENTESIS ASP PLEURAL SPACE W/IMG GUIDE  Result Date: 09/20/2020 INDICATION: Patient with history of HF, atrial fibrillation/flutter, dyspnea, and left pleural effusion. Request is made for diagnostic and therapeutic left thoracentesis. EXAM: ULTRASOUND GUIDED DIAGNOSTIC AND THERAPEUTIC LEFT THORACENTESIS MEDICATIONS: 10 mL 1% lidocaine COMPLICATIONS: None immediate. PROCEDURE: An ultrasound guided thoracentesis was thoroughly discussed with the patient and questions answered. The benefits, risks, alternatives and complications were also discussed. The patient understands and wishes to proceed with the procedure. Written consent was obtained. Ultrasound was performed to localize and mark an adequate pocket of fluid in the left chest. The area was then prepped and draped in the normal sterile fashion. 1% Lidocaine was used for local anesthesia. Under ultrasound guidance a 6 Fr Safe-T-Centesis catheter was introduced. Thoracentesis was performed. The catheter was removed and a dressing applied. FINDINGS: A total of  approximately 1.5 L of hazy amber fluid was removed. Procedure was stopped after 1.5 L secondary to patient's hypotension (73/63, 92/74) - catheter was removed and patient was laid back in bed with BP improvement (106/87). Patient remained asymptomatic throughout procedure (denied dizziness, vision changes). Samples were sent to the laboratory as requested by the clinical team. IMPRESSION: Successful ultrasound guided left thoracentesis yielding 1.5 L of pleural fluid. Read by: Elwin MochaAlexandra Louk, PA-C Electronically Signed   By: Marliss Cootsylan  Suttle MD   On: 09/20/2020 08:35   RADIOLOGY: CT Angio Chest PE W and/or Wo Contrast. Date: 08/27/2020 CLINICAL DATA: Shortness of breath and chest pain  FINDINGS: Cardiovascular: Satisfactory opacification of the pulmonary arteries to the segmental level. There are filling defects in segmental branches of bilateral upper and right middle lobes. Additional suspected filling defects within subsegmental branches. Left atrial enlargement. No evidence of right heart strain. No pericardial effusion. Mediastinum/Nodes: There are no enlarged lymph  nodes identified. Thyroid and esophagus are unremarkable. Lungs/Pleura: Patchy areas of atelectasis. No pleural effusion or pneumothorax. Upper Abdomen: No acute abnormality. Musculoskeletal: No acute osseous abnormality. Review of the MIP images confirms the above findings.  IMPRESSION: Acute segmental and subsegmental pulmonary emboli bilaterally. No evidence of right heart strain. Patchy bilateral atelectasis.  Cardiac database: EKG: 09/19/2020: Atrial flutter, 158 bpm, left axis deviation, poor R wave progression, without underlying injury pattern.  Telemetry: Personally reviewed.  Patient converted to sinus rhythm at 2200 yesterday.  Occasional PACs noted.  Echocardiogram: 08/28/2020:  1. Left ventricular ejection fraction, by estimation, is 50 to 55%. The left ventricle has low normal function. The left ventricle has no  regional wall motion abnormalities. Left ventricular diastolic parameters were normal. 2. Right ventricular systolic function is normal. The right ventricular size is normal. There is moderately elevated pulmonary artery systolic pressure. The estimated right ventricular systolic pressure is 40.6 mmHg. 3. The mitral valve is normal in structure. Trivial mitral valve regurgitation. No evidence of mitral stenosis. 4. The aortic valve is normal in structure. Aortic valve regurgitation is not visualized. 5. The inferior vena cava is dilated in size with <50% respiratory variability, suggesting right atrial pressure of 15 mmHg.  Scheduled Meds:  allopurinol  100 mg Oral q AM   amiodarone  200 mg Oral Daily   atorvastatin  10 mg Oral Daily   doxycycline  100 mg Oral Q12H   enoxaparin (LOVENOX) injection  110 mg Subcutaneous Q12H   furosemide  60 mg Intravenous Q12H   metolazone  2.5 mg Oral Daily   metoprolol tartrate  50 mg Oral BID    Continuous Infusions:  cefTRIAXone (ROCEPHIN)  IV Stopped (09/20/20 1458)   diltiazem (CARDIZEM) infusion 5 mg/hr (09/21/20 0135)   PRN Meds: levalbuterol, oxyCODONE-acetaminophen   IMPRESSION & RECOMMENDATIONS: Carl [name] is a 64 y.o. male whose past medical history and cardiac risk factors include: Pulmonary embolism, atrial flutter/fibrillation, dyslipidemia, hypertension, premature CAD in the family, smokes cigars, obesity due to excess calories.  Shortness of breath: Multifactorial (recent PE, atrial flutter/fibrillation, A/C HFpEF, Pneumonia and moderate/large pleural effusion): Improving.   Symptomatic atrial flutter with rapid ventricular rate:  Continue Cardizem drip.  Restart home dose of beta-blocker therapy, Lopressor 50 mg p.o. twice daily.  Transition from IV amiodarone to p.o. amiodarone 200 mg p.o. daily (to minimize IV fluids).   Continue telemetry.  Patient is maintaining his blood pressures.  Check EKG in the  morning  Long-term use of antiarrhythmic medications:  Patient was on Multaq prior to admission.  Given the asymptomatic atrial flutter with rapid ventricular rate transition to IV amiodarone.  AST 15, ALT 27 as of 09/19/2020.  TSH 0.457 as of 08/27/2020  Recheck TSH  If patient continues to be on amiodarone once discharged he will need long-term follow-up.  1. Acute diastolic heart failure, Most likely exacerbated by his underlying symptomatic atrial flutter, pneumonia, moderate/large pleural effusion). 2.  Excess alcohol use, patient endorses that he was drinking excessively for the past 5 years and only quit upon his first admission to the hospital on 08/27/2020. 3.  Tobacco use disorder 4.  Moderate obesity  Recommendation: Patient is converted back to sinus rhythm.  Continue anticoagulation and continue metoprolol and also amiodarone for now.  We could discontinue digoxin prior to his discharge home.  Given his body habitus, recurrence of atrial flutter, pulmonary embolism, which is spontaneous would probably recommend lifelong anticoagulation.  We could certainly consider atrial flutter ablation at a  later date as well.  He is diuresing well with regard to heart failure, symptoms of dyspnea has improved.  He still has significant amount of volume overload, continue present medications for now and possibly discontinue metolazone tomorrow.  Discussed regarding remaining abstinent from alcohol and cigars.    Yates Decamp, MD, [facility] 09/21/2020, 8:55 AM Office: [phone] Pager: [phone]

## 2020-09-22 ENCOUNTER — Inpatient Hospital Stay (HOSPITAL_COMMUNITY): Payer: Federal, State, Local not specified - PPO

## 2020-09-22 LAB — EXPECTORATED SPUTUM ASSESSMENT W GRAM STAIN, RFLX TO RESP C

## 2020-09-22 LAB — BASIC METABOLIC PANEL
Anion gap: 11 (ref 5–15)
BUN: 10 mg/dL (ref 8–23)
CO2: 36 mmol/L — ABNORMAL HIGH (ref 22–32)
Calcium: 9.1 mg/dL (ref 8.9–10.3)
Chloride: 87 mmol/L — ABNORMAL LOW (ref 98–111)
Creatinine, Ser: 0.88 mg/dL (ref 0.61–1.24)
GFR, Estimated: >60 mL/min (ref >60)
Glucose, Bld: 157 mg/dL — ABNORMAL HIGH (ref 70–99)
Potassium: 3.1 mmol/L — ABNORMAL LOW (ref 3.5–5.1)
Sodium: 134 mmol/L — ABNORMAL LOW (ref 135–145)

## 2020-09-22 LAB — MAGNESIUM: Magnesium: 1.9 mg/dL (ref 1.7–2.4)

## 2020-09-22 LAB — PROCALCITONIN: Procalcitonin: <0.1 ng/mL

## 2020-09-22 MED ORDER — LIDOCAINE HCL (PF) 1 % IJ SOLN
INTRAMUSCULAR | Status: AC
  Filled 2020-09-22: qty 10

## 2020-09-22 MED ORDER — POTASSIUM CHLORIDE CRYS ER 20 MEQ PO TBCR
40.0000 meq | EXTENDED_RELEASE_TABLET | Freq: Once | ORAL | Status: AC
  Administered 2020-09-22: 40 meq via ORAL
  Filled 2020-09-22: qty 2

## 2020-09-22 MED ORDER — METOPROLOL TARTRATE 50 MG PO TABS
50.0000 mg | ORAL_TABLET | Freq: Three times a day (TID) | ORAL | Status: DC
  Administered 2020-09-22 – 2020-09-23 (×3): 50 mg via ORAL
  Filled 2020-09-22 (×3): qty 1

## 2020-09-22 MED ORDER — ACETAMINOPHEN 325 MG PO TABS
650.0000 mg | ORAL_TABLET | Freq: Four times a day (QID) | ORAL | Status: DC | PRN
  Administered 2020-09-22: 650 mg via ORAL
  Filled 2020-09-22: qty 2

## 2020-09-22 MED ORDER — POLYETHYLENE GLYCOL 3350 17 G PO PACK
17.0000 g | PACK | Freq: Every day | ORAL | Status: DC
  Administered 2020-09-24: 17 g via ORAL
  Filled 2020-09-22 (×3): qty 1

## 2020-09-22 NOTE — Procedures (Signed)
PROCEDURE SUMMARY:  Successful US guided left thoracentesis. Yielded 650 mL of hazy peach colored fluid. Patient tolerated procedure well. No immediate complications. EBL = trace  Specimen was sent for labs.  Post procedure chest X-ray pending.  Takenya Travaglini S Levin Dagostino PA-C 09/22/2020 2:10 PM

## 2020-09-22 NOTE — Progress Notes (Signed)
SATURATION QUALIFICATIONS: (This note is used to comply with regulatory documentation for home oxygen)  Patient Saturations on Room Air at Rest = 90%  Patient Saturations on Room Air while Ambulating = 83%  Patient Saturations on 2 Liters of oxygen while Ambulating = 96%  Please briefly explain why patient needs home oxygen: Patient's oxygen saturation dropped to 83% when ambulating on room air.

## 2020-09-22 NOTE — Progress Notes (Signed)
Progress Note  Patient Name: Carl [name] Date of Encounter: 09/22/2020  Attending physician: Narda Bonds, [name] Primary care provider: Roger Kill, PA-C Primary Cardiologist: Dr. Yates Decamp Consultant:Zola Runion Odis Hollingshead, DO  Subjective: Carl [name] is a 64 y.o. male seen and examined at bedside at approximately 1230 PM.  No family present at bedside. Remains in normal sinus rhythm. No chest pain or palpitations. Still remains hypoxic with ambulation. Still has sputum production. Shortness of breath is significantly improved and patient has diuresed a net urine output of 9.1 L since admission. Lower extremity swelling still present but significantly improved now up to the midshin level. Continues to diurese with IV diuretics.  Objective: Vital Signs in the last 24 hours: Temp:  [98.1 F (36.7 C)-99.4 F (37.4 C)] 98.8 F (37.1 C) (11/13 0856) Pulse Rate:  [73-95] 79 (11/13 0407) Resp:  [16-23] 16 (11/13 0856) BP: (101-115)/(64-84) 105/84 (11/13 0856) SpO2:  [87 %-95 %] 95 % (11/13 0407) Weight:  [102.1 kg] 102.1 kg (11/13 0407)  Intake/Output:  Intake/Output Summary (Last 24 hours) at 09/22/2020 1239 Last data filed at 09/22/2020 0901 Gross per 24 hour  Intake 1515.07 ml  Output 4330 ml  Net -2814.93 ml    Net IO Since Admission: -9,170.24 mL [09/22/20 1239]  Weights:  Filed Weights   09/20/20 1511 09/21/20 0514 09/22/20 0407  Weight: 107.3 kg 105.4 kg 102.1 kg    Telemetry: Personally reviewed.  Normal sinus rhythm  Physical examination: PHYSICAL EXAM: Vitals with BMI 09/22/2020 09/22/2020 09/21/2020  Height - - -  Weight - 225 lbs 1 oz -  BMI - 32.3 -  Systolic 105 101 263  Diastolic 84 64 64  Pulse - 79 73    CONSTITUTIONAL: Appears older than stated age, hemodynamically stable. SKIN: Skin is warm and dry. No rash noted. No cyanosis. No pallor. No jaundice HEAD: Normocephalic and atraumatic.  EYES: No scleral icterus MOUTH/THROAT: Moist oral  membranes.  NECK: Positive JVD present.  No thyromegaly noted. No carotid bruits  LYMPHATIC: No visible cervical adenopathy.   CHEST Normal respiratory effort. No intercostal retractions  LUNGS:  Improved aeration bilaterally, decrease, d breath sounds at the bases.   CARDIOVASCULAR: Regular rate and rhythm positive S1-S2, tachycardic, no murmurs rubs or gallops ABDOMINAL: Obese, soft, nontender, nondistended, positive bowel sounds in all 4 quadrants no apparent ascites.  EXTREMITIES: Warm to touch bilaterally, 2+ bilateral pitting edema up to mid shin HEMATOLOGIC: No significant bruising NEUROLOGIC: Oriented to person, place, and time. Nonfocal. Normal muscle tone.  PSYCHIATRIC: Normal mood and affect. Normal behavior. Cooperative  Lab Results: Hematology Recent Labs  Lab 09/19/20 1133 09/20/20 0400  WBC 17.0* 12.6*  RBC 3.88* 3.77*  HGB 12.4* 12.3*  HCT 39.8 39.5  MCV 102.6* 104.8*  MCH 32.0 32.6  MCHC 31.2 31.1  RDW 12.9 12.8  PLT 366 333    Chemistry Recent Labs  Lab 09/19/20 1133 09/19/20 1133 09/20/20 0500 09/21/20 0441 09/22/20 0420  NA 139   < > 138 138 134*  K 4.2   < > 4.0 3.5 3.1*  CL 104   < > 101 97* 87*  CO2 25   < > 28 32 36*  GLUCOSE 112*   < > 112* 90 157*  BUN 10   < > 13 14 10   CREATININE 0.82   < > 0.93 0.88 0.88  CALCIUM 8.6*   < > 8.8* 8.9 9.1  PROT 6.0*  --   --   --   --  ALBUMIN 2.3*  --   --   --   --   AST 15  --   --   --   --   ALT 27  --   --   --   --   ALKPHOS 92  --   --   --   --   BILITOT 0.7  --   --   --   --   GFRNONAA >60   < > >60 >60 >60  ANIONGAP 10   < > 9 9 11    < > = values in this interval not displayed.     Cardiac Enzymes: Cardiac Panel (last 3 results) Recent Labs    09/19/20 1455  TROPONINIHS 4    BNP (last 3 results) Recent Labs    09/19/20 1135 09/21/20 0441  BNP 149.7* 86.4    ProBNP (last 3 results) No results for input(s): PROBNP in the last 8760 hours.   DDimer No results for input(s):  DDIMER in the last 168 hours.   Hemoglobin A1c: No results found for: HGBA1C, MPG  TSH  Recent Labs    08/27/20 1430 09/21/20 0441  TSH 0.457 2.577    Lipid Panel No results found for: CHOL, TRIG, HDL, CHOLHDL, VLDL, LDLCALC, LDLDIRECT  Imaging: ECHOCARDIOGRAM LIMITED  Addendum Date: 09/21/2020   Read by Dr. 13/10/2020 Electronically Amended 09/21/2020, 1:57 PM   Final (Amended)    Result Date: 09/21/2020    ECHOCARDIOGRAM LIMITED REPORT   Patient Name:   Carl [name] Date of Exam: 09/21/2020 Medical Rec #:  13/10/2020     Height:       70.0 in Accession #:    [id number]    Weight:       232.3 lb Date of Birth:  [birth year]     BSA:          2.224 m Patient Age:    64 years      BP:           107/76 mmHg Patient Gender: M             HR:           84 bpm. Exam Location:  Inpatient Procedure: 2D Echo Indications:     atrial flutter. pulmonary embolism.  History:         Patient has prior history of Echocardiogram examinations, most                  recent 08/28/2020. CHF, Arrythmias:Atrial Fibrillation and                  Atrial Flutter; Risk Factors:Dyslipidemia.  Sonographer:     08/30/2020 Referring Phys:  Delcie Roch Diagnosing Phys: [id number] [name] IMPRESSIONS  1. Left ventricular ejection fraction, by estimation, is 50%. The left ventricle has mildly decreased function. The left ventricle demonstrates global hypokinesis. Left ventricular diastolic parameters are indeterminate.  2. Right ventricular systolic function is normal. The right ventricular size is normal. The estimated right ventricular systolic pressure is 36.7 mmHg.  3. Left atrial size was mildly dilated.  4. The mitral valve is normal in structure. Trivial mitral valve regurgitation. No evidence of mitral stenosis.  5. The aortic valve is tricuspid. Aortic valve regurgitation is not visualized. Mild aortic valve sclerosis is present, with no evidence of aortic valve stenosis.  6. The inferior vena cava  is dilated in size with <50% respiratory variability, suggesting right atrial pressure of  15 mmHg. FINDINGS  Left Ventricle: Left ventricular ejection fraction, by estimation, is 50%. The left ventricle has mildly decreased function. The left ventricle demonstrates global hypokinesis. The left ventricular internal cavity size was normal in size. There is no left ventricular hypertrophy. Left ventricular diastolic parameters are indeterminate. Right Ventricle: The right ventricular size is normal. No increase in right ventricular wall thickness. Right ventricular systolic function is normal. The tricuspid regurgitant velocity is 2.33 m/s, and with an assumed right atrial pressure of 15 mmHg, the estimated right ventricular systolic pressure is 36.7 mmHg. Left Atrium: Left atrial size was mildly dilated. Right Atrium: Right atrial size was normal in size. Pericardium: Trivial pericardial effusion is present. Mitral Valve: The mitral valve is normal in structure. Trivial mitral valve regurgitation. No evidence of mitral valve stenosis. Tricuspid Valve: The tricuspid valve is normal in structure. Tricuspid valve regurgitation is trivial. Aortic Valve: The aortic valve is tricuspid. Aortic valve regurgitation is not visualized. Mild aortic valve sclerosis is present, with no evidence of aortic valve stenosis. Aorta: The aortic root is normal in size and structure. Venous: The inferior vena cava is dilated in size with less than 50% respiratory variability, suggesting right atrial pressure of 15 mmHg. IAS/Shunts: No atrial level shunt detected by color flow Doppler. LEFT VENTRICLE PLAX 2D LVIDd:         5.50 cm Diastology LVIDs:         3.80 cm LV e' medial:  9.46 cm/s LV PW:         1.10 cm LV e' lateral: 10.60 cm/s LV IVS:        0.90 cm  IVC IVC diam: 2.70 cm LEFT ATRIUM         Index LA diam:    4.60 cm 2.07 cm/m  TRICUSPID VALVE TR Peak grad:   21.7 mmHg TR Vmax:        233.00 cm/s Carl [name] Electronically  signed by Marca Anconaalton Mclean [name] Signature Date/Time: 09/21/2020/2:08:50 PM  Read by Dr. Marca AnconaMcLean Dalton Mclean Electronically Amended 09/21/2020, 1:57 PM   Final    RADIOLOGY: CT Angio Chest PE W and/or Wo Contrast. Date: 08/27/2020 CLINICAL DATA: Shortness of breath and chest pain  FINDINGS: Cardiovascular: Satisfactory opacification of the pulmonary arteries to the segmental level. There are filling defects in segmental branches of bilateral upper and right middle lobes. Additional suspected filling defects within subsegmental branches. Left atrial enlargement. No evidence of right heart strain. No pericardial effusion. Mediastinum/Nodes: There are no enlarged lymph nodes identified. Thyroid and esophagus are unremarkable. Lungs/Pleura: Patchy areas of atelectasis. No pleural effusion or pneumothorax. Upper Abdomen: No acute abnormality. Musculoskeletal: No acute osseous abnormality. Review of the MIP images confirms the above findings.  IMPRESSION: Acute segmental and subsegmental pulmonary emboli bilaterally. No evidence of right heart strain. Patchy bilateral atelectasis.  Cardiac database: EKG: 09/19/2020: Atrial flutter, 158 bpm, left axis deviation, poor R wave progression, without underlying injury pattern.  09/20/2020: Normal sinus rhythm, 76 bpm, possible left atrial enlargement, nonspecific T wave abnormality.  Compared to prior EKG 09/19/2020 atrial flutter is transition to normal sinus.  Echocardiogram: 08/28/2020:  1. Left ventricular ejection fraction, by estimation, is 50 to 55%. The left ventricle has low normal function. The left ventricle has no regional wall motion abnormalities. Left ventricular diastolic parameters were normal. 2. Right ventricular systolic function is normal. The right ventricular size is normal. There is moderately elevated pulmonary artery systolic pressure. The estimated right ventricular systolic pressure is 40.6 mmHg.  3. The mitral valve is normal in  structure. Trivial mitral valve regurgitation. No evidence of mitral stenosis. 4. The aortic valve is normal in structure. Aortic valve regurgitation is not visualized. 5. The inferior vena cava is dilated in size with <50% respiratory variability, suggesting right atrial pressure of 15 mmHg.  Scheduled Meds: . allopurinol  100 mg Oral q AM  . amiodarone  200 mg Oral Daily  . apixaban  5 mg Oral BID  . atorvastatin  10 mg Oral Daily  . doxycycline  100 mg Oral Q12H  . furosemide  60 mg Intravenous Q12H  . metolazone  2.5 mg Oral Daily  . metoprolol tartrate  50 mg Oral BID  . polyethylene glycol  17 g Oral Daily    Continuous Infusions: . cefTRIAXone (ROCEPHIN)  IV Stopped (09/21/20 1719)  . diltiazem (CARDIZEM) infusion 5 mg/hr (09/22/20 0410)    PRN Meds: levalbuterol, oxyCODONE-acetaminophen   IMPRESSION & RECOMMENDATIONS: Carl [name] is a 64 y.o. male whose past medical history and cardiac risk factors include: Pulmonary embolism, atrial flutter/fibrillation, dyslipidemia, hypertension, premature CAD in the family, smokes cigars, obesity due to excess calories.  Shortness of breath: Multifactorial (recent PE, atrial flutter/fibrillation, A/C HFpEF, Pneumonia and moderate/large pleural effusion): Improving.   Paroxysmal atrial flutter: Currently normal sinus rhythm  Hold Cardizem drip.  Increase Lopressor 50 mg p.o. three daily.  Continue amiodarone 200 mg p.o. daily.   Continue telemetry.  Patient systolic blood pressures are soft.  Therefore, may consider digoxin as an alternative for rate control if needed.    Had a long discussion with the patient to educate him on the importance of being evaluated for sleep apnea.  Given his body habitus, nocturnal hypoxia, atrial flutter I suspect that he does have underlying sleep apnea that should be evaluated and addressed.  Patient states that he will discuss it further with his PCP once discharged  Long-term use of  antiarrhythmic medications:  Patient was on Multaq prior to admission.  Given the asymptomatic atrial flutter with rapid ventricular rate transition to IV amiodarone.  AST 15, ALT 27 as of 09/19/2020.  TSH 2.577 as of 09/21/2020  If patient continues to be on amiodarone once discharged he will need long-term follow-up.  Long-term oral anticoagulation:  Patient is currently on Eliquis for his underlying pulmonary embolism and atrial flutter.  Risks, benefits, and alternatives to oral anticoagulation discussed with the patient at today's encounter.  Patient does not endorse any evidence of bleeding.  Acute heart failure with preserved EF, stage C NYHA class II/III: Most likely exacerbated by his underlying symptomatic atrial flutter, pneumonia, moderate/large pleural effusion): Improving  Since thoracentesis and the use of incentive spirometer patient states that his shortness of breath has improved.  On ambulation patient remains hypoxic still.  Primary team plans to repeat thoracentesis if clinically appropriate.    Since admission patient has diuresed a net output of 9.1 L.  Continue IV Lasix, will transition to Bumex tomorrow  Discontinue metolazone 2.5 mg p.o. daily  Strict I's and O's and daily weights  Monitor blood pressure, soft compared to prior.  Limited echo results reviewed.  Noted above for further reference.  BNP improving.  Hold IV Cardizem drip will increase Lopressor to 50 mg p.o. 3 times daily  Pleural effusion status post thoracentesis:  Management per primary team.  Continue incentive spirometry.  Primary team plans to reevaluate for the possibility of repeat thoracentesis as the patient remains hypoxic with ambulation on room air.  Pulmonary embolism:   Diagnosed during prior admission; CT Scan 08/27/2020 Acute segmental and subsegmental pulmonary emboli bilaterally.  Currently on Eliquis..    Secondary Diagnosis: Hypokalemia:  Recommend a potassium level of around 4. Currently managed by primary team.  We will check magnesium level for morning labs.  Dyslipidemia: Continue statin therapy.  Follow lipids.  Family history of premature CAD: Ischemic evaluation as outpatient unless if change in clinical status during this hospitalization.  Cigar smoking: Educated on the importance of complete cessation given his comorbidities underlying shortness of breath.  Patient's questions and concerns were addressed to his satisfaction. He voices understanding of the instructions provided during this encounter.   This note was created using a voice recognition software as a result there may be grammatical errors inadvertently enclosed that do not reflect the nature of this encounter. Every attempt is made to correct such errors.  Total time spent: 35 minutes discussing disease management with the patient, discussed plan of care with attending physician, ordering tests, review of diagnostic testing, formulating a plan and coordination of care.  Tessa Lerner, DO, [facility] Office: [phone] 09/22/2020, 12:39 PM

## 2020-09-22 NOTE — Progress Notes (Signed)
Pt had a Temp 100.2, rechecked 100.8. Md made aware, made orders. PRN meds given.

## 2020-09-22 NOTE — Progress Notes (Signed)
PROGRESS NOTE    Carl [name]  WOE:[id number] DOB: [birth year] DOA: 09/19/2020 PCP: Heywood Bene, PA-C   Brief Narrative: Carl [name] is a 64 y.o. male with a history of atrial fibrillation, PE, obesity, diastolic heart failure. He presented secondary to dyspnea and was found to have atrial flutter with RVR, left pleural effusion, respiratory failure and evidence of acute heart failure. He has been started on diltiazem/amiodarone drips, IV lasix, heparin drip.   Assessment & Plan:   Principal Problem:   Atrial flutter with rapid ventricular response (HCC) Active Problems:   Primary hypertension   Typical atrial flutter (HCC)   Acute pulmonary embolism (HCC)   Dyslipidemia   Class 1 obesity   Acute on chronic diastolic CHF (congestive heart failure) (HCC)   Gout   Pleural effusion on left   Unspecified atrial fibrillation ([place])   Community acquired pneumonia   Dyspnea   Long term current use of antiarrhythmic drug   Atrial flutter with rapid ventricular response Patient states he has been adherent with outpatient regimen except he has not been taking dronedarone secondary to dyspnea. In setting of likely pneumonia. Transitioned to amiodarone PO on 11/11. Patient converted to NSR on 11/11 -Cardiology recommendations: Continue diltiazem drip, metoprolol PO, amiodarone PO. Pending today  Acute on chronic diastolic heart failure In setting of above. Associated pleural effusion. -Continue Lasix -Cardiology recommendations: added metolazone  Acute respiratory failure with hypoxia Most likely secondary to pleural effusion. Recent PE could be contributing but not as likely. Placed on 3 L of oxygen. Documented hypoxia with SpO2 of 89%. Hypoxia with ambulation. -Continue oxygen and wean to room air as able -Ambulate with pulse oximetry  Pleural effusion Left sided. In setting of rapid atrial flutter and acute heart failure. Thoracentesis ordered and performed on 11/11.  No obvious evidence of infection. At least one of light's criteria met. No serum LDH available. -Follow-up pleural fluid culture (no growth x3 days) -Fluid cytology pending -Repeat left sided thoracentesis with repeat cytology  Sepsis Present on admission with possible pneumonia as source. Normal lactic acid. Blood and urine cultures obtained on admission. Procalcitonin undetectable. Urine culture significant for 4000 colonies of E. Coli -Follow up blood cultures (no growth to date)  Community acquired pneumonia Patient with fever (102.8 F prior to admission), associated leukocytosis and x-ray findings consistent with possible pneumonia. Patient was started on treatment as an outpatient with Augmentin PO. Started on Ceftriaxone/azithromycin on admission. Leukocytosis improved today.  -Continue Ceftriaxone/doxycycline  UTI -Continue Ceftriaxone  Acute pulmonary embolism Patient is currently being treated with Eliquis as an outpatient. Transitioned to heparin drip inpatient and now on Lovenox per cardiology -Eliquis per cardiology  Primary hypertension On diltiazem and metoprolol  Hyperlipidemia -Continue Lipitor  Tobacco use Counseled.  Morbid obesity Body mass index is 32.3 kg/m.   DVT prophylaxis: Heparin drip Code Status:   Code Status: Full Code Family Communication: None at bedside Disposition Plan: Discharge home pending cardiology recommendations and transition to oral regimen. Also pending repeat thoracentesis.   Consultants:   Cardiology  Procedures:   TRANSTHORACIC ECHOCARDIOGRAM (09/21/2020) IMPRESSIONS    1. Left ventricular ejection fraction, by estimation, is 50%. The left  ventricle has mildly decreased function. The left ventricle demonstrates  global hypokinesis. Left ventricular diastolic parameters are  indeterminate.  2. Right ventricular systolic function is normal. The right ventricular  size is normal. The estimated right ventricular  systolic pressure is 00.3  mmHg.  3. Left atrial size was mildly dilated.  4. The mitral valve is normal in structure. Trivial mitral valve  regurgitation. No evidence of mitral stenosis.  5. The aortic valve is tricuspid. Aortic valve regurgitation is not  visualized. Mild aortic valve sclerosis is present, with no evidence of  aortic valve stenosis.  6. The inferior vena cava is dilated in size with <50% respiratory  variability, suggesting right atrial pressure of 15 mmHg.  Antimicrobials:  Ceftriaxone  Azithromycin    Subjective: No issues this morning. Feels good.  Objective: Vitals:   09/21/20 2342 09/21/20 2344 09/22/20 0407 09/22/20 0856  BP:  103/64 101/64 105/84  Pulse: 74 73 79   Resp: 19 18 (!) 23 16  Temp:  98.3 F (36.8 C) 98.9 F (37.2 C) 98.8 F (37.1 C)  TempSrc:  Oral Oral Oral  SpO2: (!) 87% 94% 95%   Weight:   102.1 kg   Height:        Intake/Output Summary (Last 24 hours) at 09/22/2020 1234 Last data filed at 09/22/2020 0901 Gross per 24 hour  Intake 1515.07 ml  Output 4330 ml  Net -2814.93 ml   Filed Weights   09/20/20 1511 09/21/20 0514 09/22/20 0407  Weight: 107.3 kg 105.4 kg 102.1 kg    Examination:  General exam: Appears calm and comfortable Respiratory system: Clear to auscultation on right with significantly diminished breath sounds on left. Respiratory effort normal. Cardiovascular system: S1 & S2 heard, RRR. No murmurs, rubs, gallops or clicks. Gastrointestinal system: Abdomen is protuberant, soft and nontender. No organomegaly or masses felt. Normal bowel sounds heard. Central nervous system: Alert and oriented. No focal neurological deficits. Musculoskeletal: No calf tenderness Skin: No cyanosis. No rashes Psychiatry: Judgement and insight appear normal. Mood & affect appropriate.     Data Reviewed: I have personally reviewed following labs and imaging studies  CBC Lab Results  Component Value Date   WBC 12.6 (H)  09/20/2020   RBC 3.77 (L) 09/20/2020   HGB 12.3 (L) 09/20/2020   HCT 39.5 09/20/2020   MCV 104.8 (H) 09/20/2020   MCH 32.6 09/20/2020   PLT 333 09/20/2020   MCHC 31.1 09/20/2020   RDW 12.8 09/20/2020   LYMPHSABS 0.7 09/19/2020   MONOABS 1.2 (H) 09/19/2020   EOSABS 0.0 09/19/2020   BASOSABS 0.1 54/65/6812     Last metabolic panel Lab Results  Component Value Date   NA 134 (L) 09/22/2020   K 3.1 (L) 09/22/2020   CL 87 (L) 09/22/2020   CO2 36 (H) 09/22/2020   BUN 10 09/22/2020   CREATININE 0.88 09/22/2020   GLUCOSE 157 (H) 09/22/2020   GFRNONAA >60 09/22/2020   CALCIUM 9.1 09/22/2020   PHOS 2.4 (L) 08/30/2020   PROT 6.0 (L) 09/19/2020   ALBUMIN 2.3 (L) 09/19/2020   BILITOT 0.7 09/19/2020   ALKPHOS 92 09/19/2020   AST 15 09/19/2020   ALT 27 09/19/2020   ANIONGAP 11 09/22/2020    CBG (last 3)  No results for input(s): GLUCAP in the last 72 hours.   GFR: Estimated Creatinine Clearance: 101.5 mL/min (by C-G formula based on SCr of 0.88 mg/dL).  Coagulation Profile: Recent Labs  Lab 09/19/20 1133  INR 1.4*    Recent Results (from the past 240 hour(s))  Blood culture (routine single)     Status: None (Preliminary result)   Collection Time: 09/19/20 11:33 AM   Specimen: BLOOD  Result Value Ref Range Status   Specimen Description BLOOD BLOOD RIGHT HAND  Final   Special Requests   Final  BOTTLES DRAWN AEROBIC AND ANAEROBIC Blood Culture adequate volume   Culture   Final    NO GROWTH 3 DAYS Performed at [facility], [place] [address] [place], [place] [address]    Report Status PENDING  Incomplete  Urine culture     Status: Abnormal   Collection Time: 09/19/20 11:33 AM   Specimen: In/Out Cath Urine  Result Value Ref Range Status   Specimen Description IN/OUT CATH URINE  Final   Special Requests   Final    NONE Performed at [facility], [place] [address], [place], [place] [address]    Culture 4,000 COLONIES/mL ESCHERICHIA COLI (A)  Final   Report  Status 09/21/2020 FINAL  Final   Organism ID, Bacteria ESCHERICHIA COLI (A)  Final      Susceptibility   Escherichia coli - MIC*    AMPICILLIN 8 SENSITIVE Sensitive     CEFAZOLIN <=4 SENSITIVE Sensitive     CEFEPIME <=0.12 SENSITIVE Sensitive     CEFTRIAXONE <=0.25 SENSITIVE Sensitive     CIPROFLOXACIN <=0.25 SENSITIVE Sensitive     GENTAMICIN <=1 SENSITIVE Sensitive     IMIPENEM <=0.25 SENSITIVE Sensitive     NITROFURANTOIN <=16 SENSITIVE Sensitive     TRIMETH/SULFA <=20 SENSITIVE Sensitive     AMPICILLIN/SULBACTAM <=2 SENSITIVE Sensitive     PIP/TAZO <=4 SENSITIVE Sensitive     * 4,000 COLONIES/mL ESCHERICHIA COLI  Culture, blood (single)     Status: None (Preliminary result)   Collection Time: 09/19/20 12:15 PM   Specimen: BLOOD LEFT FOREARM  Result Value Ref Range Status   Specimen Description BLOOD LEFT FOREARM  Final   Special Requests   Final    BOTTLES DRAWN AEROBIC AND ANAEROBIC Blood Culture adequate volume   Culture   Final    NO GROWTH 3 DAYS Performed at [facility], [address] [place], [place] [address]    Report Status PENDING  Incomplete  Respiratory Panel by RT PCR (Flu A&B, Covid) - Nasopharyngeal Swab     Status: None   Collection Time: 09/19/20  1:11 PM   Specimen: Nasopharyngeal Swab  Result Value Ref Range Status   SARS Coronavirus 2 by RT PCR NEGATIVE NEGATIVE Final    Comment: (NOTE) SARS-CoV-2 target nucleic acids are NOT DETECTED.  The SARS-CoV-2 RNA is generally detectable in upper respiratoy specimens during the acute phase of infection. The lowest concentration of SARS-CoV-2 viral copies this assay can detect is 131 copies/mL. A negative result does not preclude SARS-Cov-2 infection and should not be used as the sole basis for treatment or other patient management decisions. A negative result may occur with  improper specimen collection/handling, submission of specimen other than nasopharyngeal swab, presence of viral mutation(s)  within the areas targeted by this assay, and inadequate number of viral copies (<131 copies/mL). A negative result must be combined with clinical observations, patient history, and epidemiological information. The expected result is Negative.  Fact Sheet for Patients:  PinkCheek.be  Fact Sheet for Healthcare Providers:  GravelBags.it  This test is no t yet approved or cleared by the [place] FDA and  has been authorized for detection and/or diagnosis of SARS-CoV-2 by FDA under an Emergency Use Authorization (EUA). This EUA will remain  in effect (meaning this test can be used) for the duration of the COVID-19 declaration under Section 564(b)(1) of the Act, 21 U.S.C. section 360bbb-3(b)(1), unless the authorization is terminated or revoked sooner.     Influenza A by  PCR NEGATIVE NEGATIVE Final   Influenza B by PCR NEGATIVE NEGATIVE Final    Comment: (NOTE) The Xpert Xpress SARS-CoV-2/FLU/RSV assay is intended as an aid in  the diagnosis of influenza from Nasopharyngeal swab specimens and  should not be used as a sole basis for treatment. Nasal washings and  aspirates are unacceptable for Xpert Xpress SARS-CoV-2/FLU/RSV  testing.  Fact Sheet for Patients: PinkCheek.be  Fact Sheet for Healthcare Providers: GravelBags.it  This test is not yet approved or cleared by the [place] FDA and  has been authorized for detection and/or diagnosis of SARS-CoV-2 by  FDA under an Emergency Use Authorization (EUA). This EUA will remain  in effect (meaning this test can be used) for the duration of the  Covid-19 declaration under Section 564(b)(1) of the Act, 21  U.S.C. section 360bbb-3(b)(1), unless the authorization is  terminated or revoked. Performed at [facility], [place] [address] [place], [place] [address]   Culture, body fluid-bottle     Status: None  (Preliminary result)   Collection Time: 09/20/20  8:35 AM   Specimen: Pleura  Result Value Ref Range Status   Specimen Description PLEURAL FLUID  Final   Special Requests LEFT LUNG  Final   Culture   Final    NO GROWTH 2 DAYS Performed at [place] [address], [place], [place] [address]    Report Status PENDING  Incomplete  Gram stain     Status: None   Collection Time: 09/20/20  8:35 AM   Specimen: Pleura  Result Value Ref Range Status   Specimen Description PLEURAL FLUID  Final   Special Requests LEFT LUNG  Final   Gram Stain   Final    WBC PRESENT,BOTH PMN AND MONONUCLEAR NO ORGANISMS SEEN CYTOSPIN SMEAR Performed at [facility], [address] [place], [place] [address]    Report Status 09/20/2020 FINAL  Final        Radiology Studies: ECHOCARDIOGRAM LIMITED  Addendum Date: 09/21/2020   Read by Dr. Azucena Fallen Electronically Amended 09/21/2020, 1:57 PM   Final (Amended)    Result Date: 09/21/2020    ECHOCARDIOGRAM LIMITED REPORT   Patient Name:   MICAH BARNIER Date of Exam: 09/21/2020 Medical Rec #:  [id number]     Height:       70.0 in Accession #:    [id number]    Weight:       232.3 lb Date of Birth:  August 19, 1956     BSA:          2.224 m Patient Age:    68 years      BP:           107/76 mmHg Patient Gender: M             HR:           84 bpm. Exam Location:  Inpatient Procedure: 2D Echo Indications:     atrial flutter. pulmonary embolism.  History:         Patient has prior history of Echocardiogram examinations, most                  recent 08/28/2020. CHF, Arrythmias:Atrial Fibrillation and                  Atrial Flutter; Risk Factors:Dyslipidemia.  Sonographer:     Johny Chess Referring Phys:  [id number] HYIFO YDXAJ Diagnosing Phys: Dixie Dials MD IMPRESSIONS  1. Left ventricular ejection fraction,  by estimation, is 50%. The left ventricle has mildly decreased function. The left ventricle demonstrates global hypokinesis. Left  ventricular diastolic parameters are indeterminate.  2. Right ventricular systolic function is normal. The right ventricular size is normal. The estimated right ventricular systolic pressure is 22.3 mmHg.  3. Left atrial size was mildly dilated.  4. The mitral valve is normal in structure. Trivial mitral valve regurgitation. No evidence of mitral stenosis.  5. The aortic valve is tricuspid. Aortic valve regurgitation is not visualized. Mild aortic valve sclerosis is present, with no evidence of aortic valve stenosis.  6. The inferior vena cava is dilated in size with <50% respiratory variability, suggesting right atrial pressure of 15 mmHg. FINDINGS  Left Ventricle: Left ventricular ejection fraction, by estimation, is 50%. The left ventricle has mildly decreased function. The left ventricle demonstrates global hypokinesis. The left ventricular internal cavity size was normal in size. There is no left ventricular hypertrophy. Left ventricular diastolic parameters are indeterminate. Right Ventricle: The right ventricular size is normal. No increase in right ventricular wall thickness. Right ventricular systolic function is normal. The tricuspid regurgitant velocity is 2.33 m/s, and with an assumed right atrial pressure of 15 mmHg, the estimated right ventricular systolic pressure is 36.1 mmHg. Left Atrium: Left atrial size was mildly dilated. Right Atrium: Right atrial size was normal in size. Pericardium: Trivial pericardial effusion is present. Mitral Valve: The mitral valve is normal in structure. Trivial mitral valve regurgitation. No evidence of mitral valve stenosis. Tricuspid Valve: The tricuspid valve is normal in structure. Tricuspid valve regurgitation is trivial. Aortic Valve: The aortic valve is tricuspid. Aortic valve regurgitation is not visualized. Mild aortic valve sclerosis is present, with no evidence of aortic valve stenosis. Aorta: The aortic root is normal in size and structure. Venous: The  inferior vena cava is dilated in size with less than 50% respiratory variability, suggesting right atrial pressure of 15 mmHg. IAS/Shunts: No atrial level shunt detected by color flow Doppler. LEFT VENTRICLE PLAX 2D LVIDd:         5.50 cm Diastology LVIDs:         3.80 cm LV e' medial:  9.46 cm/s LV PW:         1.10 cm LV e' lateral: 10.60 cm/s LV IVS:        0.90 cm  IVC IVC diam: 2.70 cm LEFT ATRIUM         Index LA diam:    4.60 cm 2.07 cm/m  TRICUSPID VALVE TR Peak grad:   21.7 mmHg TR Vmax:        233.00 cm/s Dixie Dials MD Electronically signed by Loralie Champagne MD Signature Date/Time: 09/21/2020/2:08:50 PM  Read by Dr. Loralie Champagne Mclean Electronically Amended 09/21/2020, 1:57 PM   Final         Scheduled Meds: . allopurinol  100 mg Oral q AM  . amiodarone  200 mg Oral Daily  . apixaban  5 mg Oral BID  . atorvastatin  10 mg Oral Daily  . doxycycline  100 mg Oral Q12H  . furosemide  60 mg Intravenous Q12H  . metolazone  2.5 mg Oral Daily  . metoprolol tartrate  50 mg Oral BID  . polyethylene glycol  17 g Oral Daily   Continuous Infusions: . cefTRIAXone (ROCEPHIN)  IV Stopped (09/21/20 1719)  . diltiazem (CARDIZEM) infusion 5 mg/hr (09/22/20 0410)     LOS: 3 days     Cordelia Poche, MD [facility] 09/22/2020, 12:34 PM  If 7PM-7AM, please contact night-coverage [email or web address]

## 2020-09-22 NOTE — Plan of Care (Signed)
?  Problem: Cardiac: ?Goal: Ability to achieve and maintain adequate cardiopulmonary perfusion will improve ?Outcome: Progressing ?  ?Problem: Activity: ?Goal: Capacity to carry out activities will improve ?Outcome: Progressing ?  ?

## 2020-09-22 NOTE — Progress Notes (Signed)
Received pt post thoracentesis Left,site CDI with bandaid. Pt no complaints at this time.

## 2020-09-23 ENCOUNTER — Inpatient Hospital Stay (HOSPITAL_COMMUNITY): Payer: Federal, State, Local not specified - PPO

## 2020-09-23 DIAGNOSIS — I3139 Other pericardial effusion (noninflammatory): Secondary | ICD-10-CM

## 2020-09-23 DIAGNOSIS — Z7901 Long term (current) use of anticoagulants: Secondary | ICD-10-CM

## 2020-09-23 DIAGNOSIS — E876 Hypokalemia: Secondary | ICD-10-CM

## 2020-09-23 DIAGNOSIS — I313 Pericardial effusion (noninflammatory): Secondary | ICD-10-CM

## 2020-09-23 LAB — BASIC METABOLIC PANEL
Anion gap: 12 (ref 5–15)
BUN: 10 mg/dL (ref 8–23)
CO2: 37 mmol/L — ABNORMAL HIGH (ref 22–32)
Calcium: 9.5 mg/dL (ref 8.9–10.3)
Chloride: 88 mmol/L — ABNORMAL LOW (ref 98–111)
Creatinine, Ser: 0.98 mg/dL (ref 0.61–1.24)
GFR, Estimated: >60 mL/min (ref >60)
Glucose, Bld: 88 mg/dL (ref 70–99)
Potassium: 2.9 mmol/L — ABNORMAL LOW (ref 3.5–5.1)
Sodium: 137 mmol/L (ref 135–145)

## 2020-09-23 LAB — VITAMIN B12: Vitamin B-12: 734 pg/mL (ref 180–914)

## 2020-09-23 LAB — CBC
HCT: 38.3 % — ABNORMAL LOW (ref 39.0–52.0)
Hemoglobin: 12.6 g/dL — ABNORMAL LOW (ref 13.0–17.0)
MCH: 31.7 pg (ref 26.0–34.0)
MCHC: 32.9 g/dL (ref 30.0–36.0)
MCV: 96.5 fL (ref 80.0–100.0)
Platelets: 369 10*3/uL (ref 150–400)
RBC: 3.97 MIL/uL — ABNORMAL LOW (ref 4.22–5.81)
RDW: 12.4 % (ref 11.5–15.5)
WBC: 10.7 10*3/uL — ABNORMAL HIGH (ref 4.0–10.5)
nRBC: 0 % (ref 0.0–0.2)

## 2020-09-23 LAB — FOLATE: Folate: 14.6 ng/mL (ref >5.9)

## 2020-09-23 MED ORDER — BUMETANIDE 1 MG PO TABS
1.0000 mg | ORAL_TABLET | Freq: Two times a day (BID) | ORAL | Status: DC
  Administered 2020-09-23 – 2020-09-26 (×2): 1 mg via ORAL
  Filled 2020-09-23 (×6): qty 1

## 2020-09-23 MED ORDER — METOPROLOL TARTRATE 5 MG/5ML IV SOLN
5.0000 mg | INTRAVENOUS | Status: DC | PRN
  Administered 2020-09-23: 5 mg via INTRAVENOUS
  Filled 2020-09-23: qty 5

## 2020-09-23 MED ORDER — BUMETANIDE 0.25 MG/ML IJ SOLN
1.0000 mg | Freq: Two times a day (BID) | INTRAMUSCULAR | Status: DC
  Filled 2020-09-23 (×2): qty 4

## 2020-09-23 MED ORDER — POLYVINYL ALCOHOL 1.4 % OP SOLN
1.0000 [drp] | OPHTHALMIC | Status: DC | PRN
  Filled 2020-09-23: qty 15

## 2020-09-23 MED ORDER — METOPROLOL SUCCINATE ER 100 MG PO TB24
200.0000 mg | ORAL_TABLET | Freq: Every day | ORAL | Status: DC
  Filled 2020-09-23: qty 2

## 2020-09-23 MED ORDER — METOPROLOL TARTRATE 50 MG PO TABS
50.0000 mg | ORAL_TABLET | Freq: Three times a day (TID) | ORAL | Status: DC
  Administered 2020-09-23 (×2): 50 mg via ORAL
  Filled 2020-09-23 (×2): qty 1

## 2020-09-23 MED ORDER — ASPIRIN EC 81 MG PO TBEC
81.0000 mg | DELAYED_RELEASE_TABLET | Freq: Every day | ORAL | Status: DC
  Administered 2020-09-24 – 2020-09-26 (×3): 81 mg via ORAL
  Filled 2020-09-23 (×3): qty 1

## 2020-09-23 MED ORDER — POTASSIUM CHLORIDE CRYS ER 20 MEQ PO TBCR
40.0000 meq | EXTENDED_RELEASE_TABLET | ORAL | Status: AC
  Administered 2020-09-23 (×2): 40 meq via ORAL
  Filled 2020-09-23 (×2): qty 2

## 2020-09-23 NOTE — Progress Notes (Signed)
Potassium 2.9, MD notified.  

## 2020-09-23 NOTE — Plan of Care (Signed)
?  Problem: Activity: ?Goal: Capacity to carry out activities will improve ?Outcome: Progressing ?  ?

## 2020-09-23 NOTE — Progress Notes (Addendum)
Pt requested to walk on he hallway, as noted, pt HR up to 170.  Sustaining at 140s-150s. Suggested to try to get some rest. Will monitor   Resting HR Still on 140's. Lopressor  Given Early. Will monitor.  2244 HR still at 140's. Spoke with DR Loney Loh and ordered PRN Lopressor IV. SEE EKG.

## 2020-09-23 NOTE — Progress Notes (Signed)
Pt with bp of 90/48 manually. Notified cardiology regarding bp and new medications Bumetanide and metoprolol. Awaiting further orders. Will continue to monitor pt.

## 2020-09-23 NOTE — Progress Notes (Signed)
PROGRESS NOTE    Carl [name]  BJY:[id number] DOB: Oct 06, 1956 DOA: 09/19/2020 PCP: Heywood Bene, PA-C   Brief Narrative: Carl [name] is a 64 y.o. male with a history of atrial fibrillation, PE, obesity, diastolic heart failure. He presented secondary to dyspnea and was found to have atrial flutter with RVR, left pleural effusion, respiratory failure and evidence of acute heart failure. He has been started on diltiazem/amiodarone drips, IV lasix, heparin drip.   Assessment & Plan:   Principal Problem:   Atrial flutter with rapid ventricular response (HCC) Active Problems:   Primary hypertension   Typical atrial flutter (HCC)   Acute pulmonary embolism (HCC)   Dyslipidemia   Class 1 obesity   Acute on chronic diastolic CHF (congestive heart failure) (HCC)   Gout   Pleural effusion on left   Unspecified atrial fibrillation ([place])   Community acquired pneumonia   Dyspnea   Long term current use of antiarrhythmic drug   Atrial flutter with rapid ventricular response Patient states he has been adherent with outpatient regimen except he has not been taking dronedarone secondary to dyspnea. In setting of likely pneumonia. Transitioned to amiodarone PO on 11/11. Patient converted to NSR on 11/11 -Cardiology recommendations: metoprolol PO, amiodarone PO. Pending today  Acute on chronic diastolic heart failure In setting of above. Associated pleural effusion. -Continue Lasix -Cardiology recommendations: added metolazone  Acute respiratory failure with hypoxia Most likely secondary to pleural effusion. Recent PE could be contributing but not as likely. Placed on 3 L of oxygen. Documented hypoxia with SpO2 of 89%. Hypoxia with ambulation. -Continue oxygen and wean to room air as able -Ambulate with pulse oximetry  Pleural effusion Left sided. In setting of rapid atrial flutter and acute heart failure. Thoracentesis ordered and performed on 11/11. No obvious evidence of  infection. At least one of light's criteria met. No serum LDH available. Repeat thoracentesis performed on 11/13 -Follow-up pleural fluid culture (no growth x3 days) -Fluid cytology pending  Pericardial effusion Trace seen on Transthoracic Echocardiogram with moderate seen on CT chest. BP on softer side with antihypertensive titration per cardiology -Cardiology recommendations  Sepsis Present on admission with possible pneumonia as source. Normal lactic acid. Blood and urine cultures obtained on admission. Procalcitonin undetectable. Urine culture significant for 4000 colonies of E. Coli -Follow up blood cultures (no growth to date)  Community acquired pneumonia Patient with fever (102.8 F prior to admission), associated leukocytosis and x-ray findings consistent with possible pneumonia. Patient was started on treatment as an outpatient with Augmentin PO. Started on Ceftriaxone/azithromycin on admission. Leukocytosis improved today.  -Continue Ceftriaxone/doxycycline  UTI -Continue Ceftriaxone  Acute pulmonary embolism Patient is currently being treated with Eliquis as an outpatient. Transitioned to heparin drip inpatient and now on Lovenox per cardiology -Eliquis  Primary hypertension On diltiazem and metoprolol  Hyperlipidemia -Continue Lipitor  Tobacco use Counseled.  Morbid obesity Body mass index is 31.29 kg/m.   DVT prophylaxis: Heparin drip Code Status:   Code Status: Full Code Family Communication: None at bedside Disposition Plan: Discharge home pending cardiology recommendations and transition to oral regimen. Anticipate 24 hours   Consultants:   Cardiology  Procedures:   TRANSTHORACIC ECHOCARDIOGRAM (09/21/2020) IMPRESSIONS    1. Left ventricular ejection fraction, by estimation, is 50%. The left  ventricle has mildly decreased function. The left ventricle demonstrates  global hypokinesis. Left ventricular diastolic parameters are  indeterminate.    2. Right ventricular systolic function is normal. The right ventricular  size is normal. The  estimated right ventricular systolic pressure is 00.9  mmHg.  3. Left atrial size was mildly dilated.  4. The mitral valve is normal in structure. Trivial mitral valve  regurgitation. No evidence of mitral stenosis.  5. The aortic valve is tricuspid. Aortic valve regurgitation is not  visualized. Mild aortic valve sclerosis is present, with no evidence of  aortic valve stenosis.  6. The inferior vena cava is dilated in size with <50% respiratory  variability, suggesting right atrial pressure of 15 mmHg.  Antimicrobials:  Ceftriaxone  Azithromycin    Subjective: No concerns today. Was ambulating before without issue.  Objective: Vitals:   09/23/20 0101 09/23/20 0403 09/23/20 0800 09/23/20 0911  BP: 104/72 100/63 109/71 107/63  Pulse: 72 86 94 91  Resp: _0 Temp: 98.3 F (36.8 C) 99.8 F (37.7 C)  98.3 F (36.8 C)  TempSrc: Oral Oral  Oral  SpO2: 95% 94% 93% 92%  Weight:  98.9 kg    Height:        Intake/Output Summary (Last 24 hours) at 09/23/2020 1211 Last data filed at 09/23/2020 0840 Gross per 24 hour  Intake 384.97 ml  Output 4240 ml  Net -3855.03 ml   Filed Weights   09/21/20 0514 09/22/20 0407 09/23/20 0403  Weight: 105.4 kg 102.1 kg 98.9 kg    Examination:  General exam: Appears calm and comfortable Respiratory system: Clear to auscultation. Respiratory effort normal. Cardiovascular system: S1 & S2 heard, RRR. No murmurs, rubs, gallops or clicks. Gastrointestinal system: Abdomen is protuberant, soft and nontender. No organomegaly or masses felt. Normal bowel sounds heard. Central nervous system: Alert and oriented. No focal neurological deficits. Musculoskeletal: No calf tenderness Skin: No cyanosis. No rashes Psychiatry: Judgement and insight appear normal. Mood & affect appropriate.     Data Reviewed: I have personally reviewed following  labs and imaging studies  CBC Lab Results  Component Value Date   WBC 10.7 (H) 09/23/2020   RBC 3.97 (L) 09/23/2020   HGB 12.6 (L) 09/23/2020   HCT 38.3 (L) 09/23/2020   MCV 96.5 09/23/2020   MCH 31.7 09/23/2020   PLT 369 09/23/2020   MCHC 32.9 09/23/2020   RDW 12.4 09/23/2020   LYMPHSABS 0.7 09/19/2020   MONOABS 1.2 (H) 09/19/2020   EOSABS 0.0 09/19/2020   BASOSABS 0.1 23/30/0762     Last metabolic panel Lab Results  Component Value Date   NA 137 09/23/2020   K 2.9 (L) 09/23/2020   CL 88 (L) 09/23/2020   CO2 37 (H) 09/23/2020   BUN 10 09/23/2020   CREATININE 0.98 09/23/2020   GLUCOSE 88 09/23/2020   GFRNONAA >60 09/23/2020   CALCIUM 9.5 09/23/2020   PHOS 2.4 (L) 08/30/2020   PROT 6.0 (L) 09/19/2020   ALBUMIN 2.3 (L) 09/19/2020   BILITOT 0.7 09/19/2020   ALKPHOS 92 09/19/2020   AST 15 09/19/2020   ALT 27 09/19/2020   ANIONGAP 12 09/23/2020    CBG (last 3)  No results for input(s): GLUCAP in the last 72 hours.   GFR: Estimated Creatinine Clearance: 89.8 mL/min (by C-G formula based on SCr of 0.98 mg/dL).  Coagulation Profile: Recent Labs  Lab 09/19/20 1133  INR 1.4*    Recent Results (from the past 240 hour(s))  Blood culture (routine single)     Status: None (Preliminary result)   Collection Time: 09/19/20 11:33 AM   Specimen: BLOOD  Result Value Ref Range Status   Specimen Description BLOOD BLOOD RIGHT HAND  Final  Special Requests   Final    BOTTLES DRAWN AEROBIC AND ANAEROBIC Blood Culture adequate volume   Culture   Final    NO GROWTH 4 DAYS Performed at [facility], [place] [address], [place], [place] [address]    Report Status PENDING  Incomplete  Urine culture     Status: Abnormal   Collection Time: 09/19/20 11:33 AM   Specimen: In/Out Cath Urine  Result Value Ref Range Status   Specimen Description IN/OUT CATH URINE  Final   Special Requests   Final    NONE Performed at [facility], [place] [address], [place], [place]  [address]    Culture 4,000 COLONIES/mL ESCHERICHIA COLI (A)  Final   Report Status 09/21/2020 FINAL  Final   Organism ID, Bacteria ESCHERICHIA COLI (A)  Final      Susceptibility   Escherichia coli - MIC*    AMPICILLIN 8 SENSITIVE Sensitive     CEFAZOLIN <=4 SENSITIVE Sensitive     CEFEPIME <=0.12 SENSITIVE Sensitive     CEFTRIAXONE <=0.25 SENSITIVE Sensitive     CIPROFLOXACIN <=0.25 SENSITIVE Sensitive     GENTAMICIN <=1 SENSITIVE Sensitive     IMIPENEM <=0.25 SENSITIVE Sensitive     NITROFURANTOIN <=16 SENSITIVE Sensitive     TRIMETH/SULFA <=20 SENSITIVE Sensitive     AMPICILLIN/SULBACTAM <=2 SENSITIVE Sensitive     PIP/TAZO <=4 SENSITIVE Sensitive     * 4,000 COLONIES/mL ESCHERICHIA COLI  Culture, blood (single)     Status: None (Preliminary result)   Collection Time: 09/19/20 12:15 PM   Specimen: BLOOD LEFT FOREARM  Result Value Ref Range Status   Specimen Description BLOOD LEFT FOREARM  Final   Special Requests   Final    BOTTLES DRAWN AEROBIC AND ANAEROBIC Blood Culture adequate volume   Culture   Final    NO GROWTH 4 DAYS Performed at [facility], [address], [place], [place] [address]    Report Status PENDING  Incomplete  Respiratory Panel by RT PCR (Flu A&B, Covid) - Nasopharyngeal Swab     Status: None   Collection Time: 09/19/20  1:11 PM   Specimen: Nasopharyngeal Swab  Result Value Ref Range Status   SARS Coronavirus 2 by RT PCR NEGATIVE NEGATIVE Final    Comment: (NOTE) SARS-CoV-2 target nucleic acids are NOT DETECTED.  The SARS-CoV-2 RNA is generally detectable in upper respiratoy specimens during the acute phase of infection. The lowest concentration of SARS-CoV-2 viral copies this assay can detect is 131 copies/mL. A negative result does not preclude SARS-Cov-2 infection and should not be used as the sole basis for treatment or other patient management decisions. A negative result may occur with  improper specimen collection/handling, submission of  specimen other than nasopharyngeal swab, presence of viral mutation(s) within the areas targeted by this assay, and inadequate number of viral copies (<131 copies/mL). A negative result must be combined with clinical observations, patient history, and epidemiological information. The expected result is Negative.  Fact Sheet for Patients:  PinkCheek.be  Fact Sheet for Healthcare Providers:  GravelBags.it  This test is no t yet approved or cleared by the [place] FDA and  has been authorized for detection and/or diagnosis of SARS-CoV-2 by FDA under an Emergency Use Authorization (EUA). This EUA will remain  in effect (meaning this test can be used) for the duration of the COVID-19 declaration under Section 564(b)(1) of the Act, 21 U.S.C. section 360bbb-3(b)(1), unless the authorization is terminated or revoked  sooner.     Influenza A by PCR NEGATIVE NEGATIVE Final   Influenza B by PCR NEGATIVE NEGATIVE Final    Comment: (NOTE) The Xpert Xpress SARS-CoV-2/FLU/RSV assay is intended as an aid in  the diagnosis of influenza from Nasopharyngeal swab specimens and  should not be used as a sole basis for treatment. Nasal washings and  aspirates are unacceptable for Xpert Xpress SARS-CoV-2/FLU/RSV  testing.  Fact Sheet for Patients: PinkCheek.be  Fact Sheet for Healthcare Providers: GravelBags.it  This test is not yet approved or cleared by the [place] FDA and  has been authorized for detection and/or diagnosis of SARS-CoV-2 by  FDA under an Emergency Use Authorization (EUA). This EUA will remain  in effect (meaning this test can be used) for the duration of the  Covid-19 declaration under Section 564(b)(1) of the Act, 21  U.S.C. section 360bbb-3(b)(1), unless the authorization is  terminated or revoked. Performed at [facility], [place] [address]  [place], [place] [address]   Culture, body fluid-bottle     Status: None (Preliminary result)   Collection Time: 09/20/20  8:35 AM   Specimen: Pleura  Result Value Ref Range Status   Specimen Description PLEURAL FLUID  Final   Special Requests LEFT LUNG  Final   Culture   Final    NO GROWTH 3 DAYS Performed at [place] [address] [place], [place] [address]    Report Status PENDING  Incomplete  Gram stain     Status: None   Collection Time: 09/20/20  8:35 AM   Specimen: Pleura  Result Value Ref Range Status   Specimen Description PLEURAL FLUID  Final   Special Requests LEFT LUNG  Final   Gram Stain   Final    WBC PRESENT,BOTH PMN AND MONONUCLEAR NO ORGANISMS SEEN CYTOSPIN SMEAR Performed at [facility], [address] [place], [place] [address]    Report Status 09/20/2020 FINAL  Final  Expectorated sputum assessment w rflx to resp cult     Status: None   Collection Time: 09/22/20  3:44 PM   Specimen: Expectorated Sputum  Result Value Ref Range Status   Specimen Description EXPECTORATED SPUTUM  Final   Special Requests NONE  Final   Sputum evaluation   Final    THIS SPECIMEN IS ACCEPTABLE FOR SPUTUM CULTURE Performed at [facility], [place] [address], [place], [place] [address]    Report Status 09/22/2020 FINAL  Final  Culture, respiratory     Status: None (Preliminary result)   Collection Time: 09/22/20  3:44 PM  Result Value Ref Range Status   Specimen Description EXPECTORATED SPUTUM  Final   Special Requests NONE Reflexed from [id number]  Final   Gram Stain   Final    RARE WBC PRESENT, PREDOMINANTLY PMN RARE GRAM POSITIVE COCCI IN PAIRS RARE GRAM NEGATIVE RODS    Culture   Final    CULTURE REINCUBATED FOR BETTER GROWTH Performed at [facility], [place] [address] [place], [place] [address]    Report Status PENDING  Incomplete        Radiology Studies: DG Chest 1 View  Result Date: 09/22/2020 CLINICAL DATA:  Left-sided thoracentesis  today EXAM: CHEST  1 VIEW COMPARISON:  09/20/2020 FINDINGS: Marked reduction in size of the left pleural effusion. No significant pneumothorax. Mild atelectasis and potentially a small amount of residual effusion at the left lung base. Thoracic spondylosis. Borderline enlargement of the cardiopericardial silhouette. IMPRESSION: 1.  Marked reduction in size of the left pleural effusion status post thoracentesis. No pneumothorax. 2. Borderline enlargement of the cardiopericardial silhouette. Electronically Signed   By: Van Clines M.D.   On: 09/22/2020 14:30   US THORACENTESIS ASP PLEURAL SPACE W/IMG GUIDE  Result Date: 09/23/2020 INDICATION: Diastolic heart failure with left pleural effusion. Request for diagnostic and therapeutic thoracentesis. EXAM: ULTRASOUND GUIDED LEFT THORACENTESIS MEDICATIONS: 1% lidocaine 10 mL COMPLICATIONS: None immediate. PROCEDURE: An ultrasound guided thoracentesis was thoroughly discussed with the patient and questions answered. The benefits, risks, alternatives and complications were also discussed. The patient understands and wishes to proceed with the procedure. Written consent was obtained. Ultrasound was performed to localize and mark an adequate pocket of fluid in the left chest. The area was then prepped and draped in the normal sterile fashion. 1% Lidocaine was used for local anesthesia. Under ultrasound guidance a 6 Fr Safe-T-Centesis catheter was introduced. Thoracentesis was performed. The catheter was removed and a dressing applied. FINDINGS: A total of approximately 650 mL of hazy peach colored fluid was removed. Samples were sent to the laboratory as requested by the clinical team. IMPRESSION: Successful ultrasound guided left thoracentesis yielding 650 mL of pleural fluid. No pneumothorax on post-procedure chest x-ray. Read by: Gareth Eagle, PA-C Electronically Signed   By: Aletta Edouard M.D.   On: 09/23/2020 11:20        Scheduled Meds: . allopurinol   100 mg Oral q AM  . amiodarone  200 mg Oral Daily  . apixaban  5 mg Oral BID  . atorvastatin  10 mg Oral Daily  . doxycycline  100 mg Oral Q12H  . furosemide  60 mg Intravenous Q12H  . metoprolol tartrate  50 mg Oral TID  . polyethylene glycol  17 g Oral Daily   Continuous Infusions: . cefTRIAXone (ROCEPHIN)  IV Stopped (09/22/20 1600)     LOS: 4 days     Cordelia Poche, MD [facility] 09/23/2020, 12:11 PM  If 7PM-7AM, please contact night-coverage [email or web address]

## 2020-09-23 NOTE — Progress Notes (Signed)
Patient complains about tongue soreness, redness while brushing teeth. Says this is new for him.

## 2020-09-23 NOTE — Progress Notes (Signed)
SATURATION QUALIFICATIONS: (This note is used to comply with regulatory documentation for home oxygen)  Patient Saturations on Room Air at Rest = 94%  Patient Saturations on Room Air while Ambulating = 89%  Patient Saturations on 0Liters of oxygen while Ambulating = 93%  Please briefly explain why patient needs home oxygen: Pt required no oxygen with ambulation to increase oxygen saturation back to 93%. Pt oxygen came back up within a few seconds.

## 2020-09-23 NOTE — Progress Notes (Signed)
Progress Note  Patient Name: Carl [name] Date of Encounter: 09/23/2020  Attending physician: Narda BondsNettey, Ralph A, MD Primary care provider: Roger KillWilliams, Breejante J, PA-C Primary Cardiologist: Dr. Yates DecampJay Ganji Consultant:Celeste Cristela Blue [place], PA-C  Subjective: Carl [name] is a 64 y.o. male seen and examined at bedside at approximately 1215 PM.  No family present at bedside. He continues to maintain normal sinus rhythm. He denies chest pain, palpitations, orthopnea. Does continue to have dyspnea on exertion, significantly improved.  He has been walking around his room without issue. Patient has diuresed a net urine output of 13.0 L since admission. Patient states sputum production has significantly improved since yesterday.   Continues to diurese with IV diuretics. Patient underwent repeat thoracentesis yesterday (09/22/2020) with removal of 650 mL. Repeat chest CT was done earlier today with new moderate sized pericardial effusion and small left pleural effusion.  Patient oxygen saturation dropped to 89% upon ambulation, patient did not require supplemental oxygen to improve to 93%.   Objective: Vital Signs in the last 24 hours: Temp:  [98.3 F (36.8 C)-100.8 F (38.2 C)] 98.3 F (36.8 C) (11/14 0911) Pulse Rate:  [72-94] 88 (11/14 1400) Resp:  [18-20] 18 (11/14 1400) BP: (90-113)/(48-72) 90/48 (11/14 1400) SpO2:  [91 %-95 %] 92 % (11/14 0911) Weight:  [98.9 kg] 98.9 kg (11/14 0403)  Intake/Output:  Intake/Output Summary (Last 24 hours) at 09/23/2020 1447 Last data filed at 09/23/2020 0840 Gross per 24 hour  Intake 384.97 ml  Output 3640 ml  Net -3255.03 ml    Net IO Since Admission: -13,025.27 mL [09/23/20 1447]  Weights:  Filed Weights   09/21/20 0514 09/22/20 0407 09/23/20 0403  Weight: 105.4 kg 102.1 kg 98.9 kg    Telemetry: Personally reviewed.  Normal sinus rhythm  Physical examination: PHYSICAL EXAM: Vitals with BMI 09/23/2020 09/23/2020 09/23/2020  Height - - -   Weight - - -  BMI - - -  Systolic 90 107 109  Diastolic 48 63 71  Pulse 88 91 94    CONSTITUTIONAL: Appears older than stated age, hemodynamically stable. SKIN: Skin is warm and dry. No rash noted. No cyanosis. No pallor. No jaundice HEAD: Normocephalic and atraumatic.  EYES: No scleral icterus MOUTH/THROAT: Moist oral membranes.  NECK: No JVD present.  No thyromegaly noted. No carotid bruits  LYMPHATIC: No visible cervical adenopathy.   CHEST Normal respiratory effort. No intercostal retractions  LUNGS:  Improved aeration bilaterally, decrease, d breath sounds at the bases.   CARDIOVASCULAR: Regular rate and rhythm positive S1-S2, no murmurs rubs or gallops ABDOMINAL: Obese, soft, nontender, nondistended, positive bowel sounds in all 4 quadrants no apparent ascites.  EXTREMITIES: Warm to touch bilaterally, 1+ bilateral pitting edema up to mid shin HEMATOLOGIC: No significant bruising NEUROLOGIC: Oriented to person, place, and time. Nonfocal. Normal muscle tone.  PSYCHIATRIC: Normal mood and affect. Normal behavior. Cooperative  Lab Results: Hematology Recent Labs  Lab 09/19/20 1133 09/20/20 0400 09/23/20 0138  WBC 17.0* 12.6* 10.7*  RBC 3.88* 3.77* 3.97*  HGB 12.4* 12.3* 12.6*  HCT 39.8 39.5 38.3*  MCV 102.6* 104.8* 96.5  MCH 32.0 32.6 31.7  MCHC 31.2 31.1 32.9  RDW 12.9 12.8 12.4  PLT 366 333 369    Chemistry Recent Labs  Lab 09/19/20 1133 09/20/20 0500 09/21/20 0441 09/22/20 0420 09/23/20 0138  NA 139   < > 138 134* 137  K 4.2   < > 3.5 3.1* 2.9*  CL 104   < > 97* 87* 88*  CO2 25   < >  32 36* 37*  GLUCOSE 112*   < > 90 157* 88  BUN 10   < > CREATININE 0.82   < > 0.88 0.88 0.98  CALCIUM 8.6*   < > 8.9 9.1 9.5  PROT 6.0*  --   --   --   --   ALBUMIN 2.3*  --   --   --   --   AST 15  --   --   --   --   ALT 27  --   --   --   --   ALKPHOS 92  --   --   --   --   BILITOT 0.7  --   --   --   --   GFRNONAA >60   < > >60 >60 >60  ANIONGAP 10   <  > < > = values in this interval not displayed.     Cardiac Enzymes: Cardiac Panel (last 3 results) No results for input(s): CKTOTAL, CKMB, TROPONINIHS, RELINDX in the last 72 hours.  BNP (last 3 results) Recent Labs    09/19/20 1135 09/21/20 0441  BNP 149.7* 86.4    ProBNP (last 3 results) No results for input(s): PROBNP in the last 8760 hours.   DDimer No results for input(s): DDIMER in the last 168 hours.   Hemoglobin A1c: No results found for: HGBA1C, MPG  TSH  Recent Labs    08/27/20 1430 09/21/20 0441  TSH 0.457 2.577    Lipid Panel No results found for: CHOL, TRIG, HDL, CHOLHDL, VLDL, LDLCALC, LDLDIRECT  Imaging: DG Chest 1 View  Result Date: 09/22/2020 CLINICAL DATA:  Left-sided thoracentesis today EXAM: CHEST  1 VIEW COMPARISON:  09/20/2020 FINDINGS: Marked reduction in size of the left pleural effusion. No significant pneumothorax. Mild atelectasis and potentially a small amount of residual effusion at the left lung base. Thoracic spondylosis. Borderline enlargement of the cardiopericardial silhouette. IMPRESSION: 1. Marked reduction in size of the left pleural effusion status post thoracentesis. No pneumothorax. 2. Borderline enlargement of the cardiopericardial silhouette. Electronically Signed   By: Gaylyn Rong M.D.   On: 09/22/2020 14:30   CT CHEST WO CONTRAST  Result Date: 09/23/2020 CLINICAL DATA:  Follow-up left-sided thoracentesis. Pleural effusion. Persistent cough. EXAM: CT CHEST WITHOUT CONTRAST TECHNIQUE: Multidetector CT imaging of the chest was performed following the standard protocol without IV contrast. COMPARISON:  08/27/2020 FINDINGS: Cardiovascular: Moderate size pericardial effusion has developed since the previous study. Extensive coronary artery calcification again evident. Aortic atherosclerotic calcification again evident. Small pulmonary emboli seen previously cannot be visualized in the absence of contrast.  Mediastinum/Nodes: No mediastinal or hilar mass or lymphadenopathy. Lungs/Pleura: Trace amount right pleural fluid layering dependently with minimal dependent atelectasis. Small left effusion layering dependently with mild dependent adjacent atelectasis. Non dependent aerated lung appears normal. Upper Abdomen: No acute or significant upper abdominal finding. Musculoskeletal: Ordinary thoracic degenerative changes. IMPRESSION: 1. Moderate size pericardial effusion has developed since the previous study. 2. Trace amount of right pleural fluid layering dependently with minimal dependent atelectasis. Small left effusion layering dependently with mild dependent atelectasis. 3. Small pulmonary emboli seen previously cannot be visualized in the absence of contrast. 4. Aortic atherosclerosis.  Coronary artery calcification. Aortic Atherosclerosis (ICD10-I70.0). Electronically Signed   By: Paulina Fusi M.D.   On: 09/23/2020 13:06   US THORACENTESIS ASP PLEURAL SPACE W/IMG GUIDE  Result Date: 09/23/2020 INDICATION: Diastolic heart failure with left pleural effusion.  Request for diagnostic and therapeutic thoracentesis. EXAM: ULTRASOUND GUIDED LEFT THORACENTESIS MEDICATIONS: 1% lidocaine 10 mL COMPLICATIONS: None immediate. PROCEDURE: An ultrasound guided thoracentesis was thoroughly discussed with the patient and questions answered. The benefits, risks, alternatives and complications were also discussed. The patient understands and wishes to proceed with the procedure. Written consent was obtained. Ultrasound was performed to localize and mark an adequate pocket of fluid in the left chest. The area was then prepped and draped in the normal sterile fashion. 1% Lidocaine was used for local anesthesia. Under ultrasound guidance a 6 Fr Safe-T-Centesis catheter was introduced. Thoracentesis was performed. The catheter was removed and a dressing applied. FINDINGS: A total of approximately 650 mL of hazy peach colored fluid  was removed. Samples were sent to the laboratory as requested by the clinical team. IMPRESSION: Successful ultrasound guided left thoracentesis yielding 650 mL of pleural fluid. No pneumothorax on post-procedure chest x-ray. Read by: Corrin Parker, PA-C Electronically Signed   By: Irish Lack M.D.   On: 09/23/2020 11:20   RADIOLOGY: CT Angio Chest PE W and/or Wo Contrast. Date: 08/27/2020 CLINICAL DATA: Shortness of breath and chest pain  FINDINGS: Cardiovascular: Satisfactory opacification of the pulmonary arteries to the segmental level. There are filling defects in segmental branches of bilateral upper and right middle lobes. Additional suspected filling defects within subsegmental branches. Left atrial enlargement. No evidence of right heart strain. No pericardial effusion. Mediastinum/Nodes: There are no enlarged lymph nodes identified. Thyroid and esophagus are unremarkable. Lungs/Pleura: Patchy areas of atelectasis. No pleural effusion or pneumothorax. Upper Abdomen: No acute abnormality. Musculoskeletal: No acute osseous abnormality. Review of the MIP images confirms the above findings.  IMPRESSION: Acute segmental and subsegmental pulmonary emboli bilaterally. No evidence of right heart strain. Patchy bilateral atelectasis.  Cardiac database: EKG: 09/19/2020: Atrial flutter, 158 bpm, left axis deviation, poor R wave progression, without underlying injury pattern.  09/20/2020: Normal sinus rhythm, 76 bpm, possible left atrial enlargement, nonspecific T wave abnormality.  Compared to prior EKG 09/19/2020 atrial flutter is transition to normal sinus.  Echocardiogram: 08/28/2020:  1. Left ventricular ejection fraction, by estimation, is 50 to 55%. The left ventricle has low normal function. The left ventricle has no regional wall motion abnormalities. Left ventricular diastolic parameters were normal. 2. Right ventricular systolic function is normal. The right ventricular size is normal.  There is moderately elevated pulmonary artery systolic pressure. The estimated right ventricular systolic pressure is 40.6 mmHg. 3. The mitral valve is normal in structure. Trivial mitral valve regurgitation. No evidence of mitral stenosis. 4. The aortic valve is normal in structure. Aortic valve regurgitation is not visualized. 5. The inferior vena cava is dilated in size with <50% respiratory variability, suggesting right atrial pressure of 15 mmHg.  Scheduled Meds: . allopurinol  100 mg Oral q AM  . amiodarone  200 mg Oral Daily  . apixaban  5 mg Oral BID  . atorvastatin  10 mg Oral Daily  . bumetanide  1 mg Oral BID  . doxycycline  100 mg Oral Q12H  . metoprolol tartrate  50 mg Oral TID  . polyethylene glycol  17 g Oral Daily    Continuous Infusions: . cefTRIAXone (ROCEPHIN)  IV 2 g (09/23/20 1426)    PRN Meds: acetaminophen, levalbuterol, oxyCODONE-acetaminophen, polyvinyl alcohol   IMPRESSION & RECOMMENDATIONS: Carl [name] is a 64 y.o. male whose past medical history and cardiac risk factors include: Pulmonary embolism, atrial flutter/fibrillation, dyslipidemia, hypertension, premature CAD in the family, smokes cigars, obesity due to  excess calories.  Shortness of breath: Multifactorial (recent PE, atrial flutter/fibrillation, A/C HFpEF, Pneumonia and moderate/large pleural effusion): Improving.   Paroxysmal atrial flutter: Currently normal sinus rhythm  Continue Lopressor 50 mg p.o. 3 times daily.  Stop furosemide 60 mg IVP twice daily.  Start Bumex 1 mg p.o. twice daily.  Continue amiodarone 200 mg p.o. daily.   Continue telemetry.  Patient will need evaluation for sleep apnea as outpatient.   Long-term use of antiarrhythmic medications:  Patient was on Multaq prior to admission.  Given the asymptomatic atrial flutter with rapid ventricular rate continue IV amiodarone.  AST 15, ALT 27 as of 09/19/2020.  TSH 2.577 as of 09/21/2020  If patient continues  to be on amiodarone once discharged he will need long-term follow-up.  Long-term oral anticoagulation:  Patient is currently on Eliquis for his underlying pulmonary embolism and atrial flutter.  Risks, benefits, and alternatives to oral anticoagulation discussed with the patient at today's encounter.  Patient does not endorse any evidence of bleeding.  Acute heart failure with preserved EF, stage C NYHA class II:  resolving  Patient underwent repeat thoracentesis and continues to use incentive spirometer.  Shortness of breath continues to improve.   Since admission patient has diuresed a net output of 13.0 L.  Stop IV Lasix transition to Bumex 1 mg p.o. twice daily.    Strict I's and O's and daily weights  Monitor blood pressure, soft compared to prior.  BNP improved  Continue Lopressor 50 mg p.o. 3 times daily  Patient no longer hypoxic while ambulating  Pleural effusion status post thoracentesis x2:  Management per primary team.  Continue incentive spirometry.  Primary team repeated thoracentesis with yield of 650 mL yesterday.   Pulmonary embolism:   Diagnosed during prior admission; CT Scan 08/27/2020 Acute segmental and subsegmental pulmonary emboli bilaterally.  Currently on Eliquis.  Pericardial Effusion:   Repeat chest CT this morning showed new moderate sized pericardial effusion. Will obtain echocardiogram to further evaluate.   Coronary artery calcification: Most recent CT scan without contrast notes coronary artery calcification.  Patient remains asymptomatic and denies any chest pain at rest or with effort related activities.  Continue statin therapy.  Add aspirin 81 mg p.o. daily.  Will need ischemic evaluation in the near future either prior to discharge or soon as outpatient.  Will discuss with him tomorrow.  Secondary Diagnosis: Hypokalemia: Recommend a potassium level of around 4. Currently managed by primary team.    Dyslipidemia: Continue  statin therapy.  Follow lipids.  Cigar smoking: Educated on the importance of complete cessation given his comorbidities underlying shortness of breath.  Patient's questions and concerns were addressed to his satisfaction. He voices understanding of the instructions provided during this encounter.   This note was created using a voice recognition software as a result there may be grammatical errors inadvertently enclosed that do not reflect the nature of this encounter. Every attempt is made to correct such errors.  Total time spent: 35 minutes discussing disease management with the patient, discussed plan of care with attending physician, ordering tests, review of diagnostic testing, formulating a plan and coordination of care.   Rayford Halsted, PA-C 09/23/2020, 2:47 PM Office: [phone]  ADDENDUM:  Shared Visit: Patient was independently interviewed and examined by me. This has been a shared visit with [name], PA in consultation with Dr. Odis Hollingshead. I reviewed the above and agree the findings and recommendations, unless noted differently below.   Reviewed examined at bedside at  approximately 1 PM. His shortness of breath is improved significantly since admission.  He is diuresed approximately 13 L. Denies any chest pain, orthopnea, paroxysmal nocturnal dyspnea.  Lower extremity swelling improved.  PHYSICAL EXAM: Vitals with BMI 09/23/2020 09/23/2020 09/23/2020  Height - - -  Weight - - -  BMI - - -  Systolic 120 125 761  Diastolic 104 64 68  Pulse - 93 -   CONSTITUTIONAL: Well-developed and well-nourished. No acute distress.  Hemodynamically stable. SKIN: Skin is warm and dry. No rash noted. No cyanosis. No pallor. No jaundice HEAD: Normocephalic and atraumatic.  EYES: No scleral icterus MOUTH/THROAT: Moist oral membranes.  NECK: No JVD present. No thyromegaly noted. No carotid bruits  LYMPHATIC: No visible cervical adenopathy.  CHEST Normal respiratory effort. No  intercostal retractions  LUNGS: Clear to auscultation in the upper lung fields with decreased breath sounds at the bases, no stridor. No wheezes. No rales.  CARDIOVASCULAR: Regular, positive S1-S2, no murmurs rubs or gallops appreciated. ABDOMINAL: Obese, soft, nontender, non-distended, no apparent ascites.  EXTREMITIES: +1 bilateral peripheral edema, warm to touch HEMATOLOGIC: No significant bruising NEUROLOGIC: Oriented to person, place, and time. Nonfocal. Normal muscle tone.  PSYCHIATRIC: Normal mood and affect. Normal behavior. Cooperative    Telemetry reviewed.  Normal sinus rhythm.   Assessment/Plan: Paroxysmal atrial flutter: Continue amiodarone, Lopressor, and Eliquis.  Acute heart failure with preserved EF, stage C, NYHA class II: Continue guideline directed medical therapy.  We will transition IV Lasix to Bumex.  Since admission net urine output of -13L.   Pericardial effusion: Noted to be moderate based on CT scan performed today.  Plan echocardiogram to reevaluate pericardial effusion and presence / absence of tamponade physiology.   Hypokalemia: Diuresis induced.  Currently managed per primary team.  Keep potassium at 4.  Coronary artery calcification: Start aspirin 81 mg p.o. daily.  Continue statin therapy.  Patient does not have any active chest pain and is doing well from a cardiovascular standpoint since diuresis and restoration of normal sinus rhythm.  Patient will benefit from ischemic evaluation to be discussed with him.  Further recommendations to follow as the case evolves.   This note was created using a voice recognition software as a result there may be grammatical errors inadvertently enclosed that do not reflect the nature of this encounter. Every attempt is made to correct such errors.   Tessa Lerner, [place], [facility]  Pager: [phone] Office: [phone]

## 2020-09-24 ENCOUNTER — Inpatient Hospital Stay (HOSPITAL_COMMUNITY): Payer: Federal, State, Local not specified - PPO

## 2020-09-24 LAB — ECHOCARDIOGRAM LIMITED
Height: 70 in
S' Lateral: 4.4 cm
Weight: 3443.2 oz

## 2020-09-24 LAB — BASIC METABOLIC PANEL
Anion gap: 9 (ref 5–15)
BUN: 16 mg/dL (ref 8–23)
CO2: 34 mmol/L — ABNORMAL HIGH (ref 22–32)
Calcium: 9 mg/dL (ref 8.9–10.3)
Chloride: 91 mmol/L — ABNORMAL LOW (ref 98–111)
Creatinine, Ser: 0.97 mg/dL (ref 0.61–1.24)
GFR, Estimated: >60 mL/min (ref >60)
Glucose, Bld: 117 mg/dL — ABNORMAL HIGH (ref 70–99)
Potassium: 3.1 mmol/L — ABNORMAL LOW (ref 3.5–5.1)
Sodium: 134 mmol/L — ABNORMAL LOW (ref 135–145)

## 2020-09-24 LAB — CULTURE, BLOOD (SINGLE)
Culture: NO GROWTH
Culture: NO GROWTH
Special Requests: ADEQUATE
Special Requests: ADEQUATE

## 2020-09-24 LAB — CYTOLOGY - NON PAP

## 2020-09-24 MED ORDER — POTASSIUM CHLORIDE ER 10 MEQ PO TBCR: 40.0000 meq | EXTENDED_RELEASE_TABLET | Freq: Once | ORAL | Status: DC

## 2020-09-24 MED ORDER — DIGOXIN 125 MCG PO TABS
0.2500 mg | ORAL_TABLET | Freq: Every day | ORAL | Status: DC
  Administered 2020-09-24: 0.25 mg via ORAL
  Filled 2020-09-24: qty 2

## 2020-09-24 MED ORDER — AMIODARONE HCL IN DEXTROSE 360-4.14 MG/200ML-% IV SOLN
60.0000 mg/h | INTRAVENOUS | Status: DC
  Administered 2020-09-24: 60 mg/h via INTRAVENOUS
  Filled 2020-09-24 (×2): qty 200

## 2020-09-24 MED ORDER — AMIODARONE HCL IN DEXTROSE 360-4.14 MG/200ML-% IV SOLN
30.0000 mg/h | INTRAVENOUS | Status: DC
  Administered 2020-09-24 – 2020-09-25 (×2): 30 mg/h via INTRAVENOUS
  Filled 2020-09-24 (×2): qty 200

## 2020-09-24 MED ORDER — POTASSIUM CHLORIDE CRYS ER 20 MEQ PO TBCR
40.0000 meq | EXTENDED_RELEASE_TABLET | ORAL | Status: AC
  Administered 2020-09-24 (×2): 40 meq via ORAL
  Filled 2020-09-24 (×2): qty 2

## 2020-09-24 MED ORDER — DILTIAZEM HCL 30 MG PO TABS
30.0000 mg | ORAL_TABLET | Freq: Four times a day (QID) | ORAL | Status: DC
  Administered 2020-09-24 – 2020-09-25 (×5): 30 mg via ORAL
  Filled 2020-09-24 (×5): qty 1

## 2020-09-24 MED ORDER — METOPROLOL TARTRATE 50 MG PO TABS
50.0000 mg | ORAL_TABLET | Freq: Two times a day (BID) | ORAL | Status: DC
  Administered 2020-09-24 – 2020-09-25 (×3): 50 mg via ORAL
  Filled 2020-09-24 (×3): qty 1

## 2020-09-24 MED ORDER — POLYETHYLENE GLYCOL 3350 17 G PO PACK: 17.0000 g | PACK | Freq: Every day | ORAL | Status: DC | PRN

## 2020-09-24 MED ORDER — AMIODARONE LOAD VIA INFUSION
150.0000 mg | Freq: Once | INTRAVENOUS | Status: AC
  Administered 2020-09-24: 150 mg via INTRAVENOUS
  Filled 2020-09-24: qty 83.34

## 2020-09-24 MED ORDER — SPIRONOLACTONE 25 MG PO TABS
25.0000 mg | ORAL_TABLET | Freq: Every day | ORAL | Status: DC
  Administered 2020-09-24 – 2020-09-26 (×3): 25 mg via ORAL
  Filled 2020-09-24 (×3): qty 1

## 2020-09-24 NOTE — Progress Notes (Addendum)
   09/24/20 0250  Assess: MEWS Score  Temp 98.9 F (37.2 C)  BP 90/60  Pulse Rate 95  ECG Heart Rate (!) 146  Resp 19  Level of Consciousness Alert  SpO2 92 %  O2 Device Nasal Cannula  O2 Flow Rate (L/min) 2 L/min (2)  Assess: MEWS Score  MEWS Temp 0  MEWS Systolic 1  MEWS Pulse 3  MEWS RR 0  MEWS LOC 0  MEWS Score 4  MEWS Score Color Red  Assess: if the MEWS score is Yellow or Red  Were vital signs taken at a resting state? Yes  Focused Assessment No change from prior assessment  Early Detection of Sepsis Score *See Row Information* Medium  MEWS guidelines implemented *See Row Information* Yes  Treat  MEWS Interventions Administered scheduled meds/treatments  Pain Scale 0-10  Take Vital Signs  Increase Vital Sign Frequency  Red: Q 1hr X 4 then Q 4hr X 4, if remains red, continue Q 4hrs  Notify: Charge Nurse/RN  Name of Charge Nurse/RN Notified SAME RN  Notify: Provider  Provider Name/Title DR Loney Loh  Date Provider Notified 09/24/20  Time Provider Notified 0300  Notification Type Call  Notification Reason Other (Comment)  Response See new orders  Date of Provider Response 09/24/20  Time of Provider Response 0310  Document  Patient Outcome Not stable and remains on department  Progress note created (see row info) Yes  amio drip started, will monitor. PT IS STABLE, for correction.

## 2020-09-24 NOTE — Progress Notes (Signed)
Despite of Metoprolol IV given. Pt HR still High. Pt denies dizziness, no complaints. Pt back to Afib. Pt said he does not feel his Heart rate is high.Will monitor.

## 2020-09-24 NOTE — Progress Notes (Signed)
Pt HR sustaining 140-150's in Afib. Paged Primary MD; MD stated to page Cardiology; Dr. Jacinto Halim placed orders. Following this pt BP decreased to 84/66; Repaged regarding this; Dr. Jacinto Halim stated to continue to monitor. Pt asymptomatic at this time; pt resting in recliner with eye closed; Will continue to monitor.

## 2020-09-24 NOTE — Progress Notes (Addendum)
Progress Note  Patient Name: Gerhard Rappaport Date of Encounter: 09/24/2020  Attending physician: Narda Bonds, MD Primary care provider: Roger Kill, PA-C Primary Cardiologist: Dr. Yates Decamp Consultant:Naureen Benton Jacinto Halim, MD  Subjective: Carl [name] is a 64 y.o. male with paroxysmal typical atrial flutter with RVR, history of pulmonary embolism when he presented to the ED on 08/27/2020, has had cardioversion but reverted back to atrial flutter, readmitted to the hospital with acute diastolic heart failure, recurrence of atrial flutter with RVR and bilateral pneumonia and pleural effusion.  Underwent left thoracentesis with aspiration of 1.5 L of fluid.  CT scan was repeated revealing moderate pericardial effusion that was done for follow-up of pleural effusion.  This morning patient states that he feels well.  Last night developed A. Fib with RVR again and also borderline low blood pressure.  He was restarted back on amiodarone.   Objective: Vital Signs in the last 24 hours: Temp:  [98 F (36.7 C)-99.6 F (37.6 C)] 98.5 F (36.9 C) (11/15 0743) Pulse Rate:  [33-151] 151 (11/15 0743) Resp:  [18-20] 20 (11/15 0743) BP: (83-125)/(48-104) 108/88 (11/15 0743) SpO2:  [92 %-95 %] 92 % (11/15 0743) Weight:  [97.6 kg] 97.6 kg (11/15 0100)  Intake/Output:  Intake/Output Summary (Last 24 hours) at 09/24/2020 0918 Last data filed at 09/24/2020 0859 Gross per 24 hour  Intake --  Output 1700 ml  Net -1700 ml    Net IO Since Admission: -14,725.27 mL [09/24/20 0918]  Weights:  Filed Weights   09/22/20 0407 09/23/20 0403 09/24/20 0100  Weight: 102.1 kg 98.9 kg 97.6 kg   PHYSICAL EXAM: Vitals with BMI 09/24/2020 09/24/2020 09/24/2020  Height - - -  Weight - - -  BMI - - -  Systolic 108 94 93  Diastolic 88 78 75  Pulse 151 78 33   Physical Exam Constitutional:      Appearance: He is obese.  Cardiovascular:     Rate and Rhythm: Tachycardia present. Rhythm irregular.      Pulses: Normal pulses and intact distal pulses.     Heart sounds: Normal heart sounds. No murmur heard.  No gallop.      Comments: No pedal edema, no JVD. Pulmonary:     Effort: Pulmonary effort is normal.     Breath sounds: Normal breath sounds.  Abdominal:     General: Bowel sounds are normal.     Palpations: Abdomen is soft.     Lab Results: Hematology Recent Labs  Lab 09/19/20 1133 09/20/20 0400 09/23/20 0138  WBC 17.0* 12.6* 10.7*  RBC 3.88* 3.77* 3.97*  HGB 12.4* 12.3* 12.6*  HCT 39.8 39.5 38.3*  MCV 102.6* 104.8* 96.5  MCH 32.0 32.6 31.7  MCHC 31.2 31.1 32.9  RDW 12.9 12.8 12.4  PLT 366 333 369    Chemistry Recent Labs  Lab 09/19/20 1133 09/20/20 0500 09/22/20 0420 09/23/20 0138 09/24/20 0452  NA 139   < > 134* 137 134*  K 4.2   < > 3.1* 2.9* 3.1*  CL 104   < > 87* 88* 91*  CO2 25   < > 36* 37* 34*  GLUCOSE 112*   < > 157* 88 117*  BUN 10   < > 10 10 16   CREATININE 0.82   < > 0.88 0.98 0.97  CALCIUM 8.6*   < > 9.1 9.5 9.0  PROT 6.0*  --   --   --   --   ALBUMIN 2.3*  --   --   --   --  AST 15  --   --   --   --   ALT 27  --   --   --   --   ALKPHOS 92  --   --   --   --   BILITOT 0.7  --   --   --   --   GFRNONAA >60   < > >60 >60 >60  ANIONGAP 10   < > 11 12 9    < > = values in this interval not displayed.     Cardiac Enzymes: Cardiac Panel (last 3 results) No results for input(s): CKTOTAL, CKMB, TROPONINIHS, RELINDX in the last 72 hours.  BNP (last 3 results) Recent Labs    09/19/20 1135 09/21/20 0441  BNP 149.7* 86.4    ProBNP (last 3 results) No results for input(s): PROBNP in the last 8760 hours.   DDimer No results for input(s): DDIMER in the last 168 hours.   Hemoglobin A1c: No results found for: HGBA1C, MPG  TSH  Recent Labs    08/27/20 1430 09/21/20 0441  TSH 0.457 2.577    Lipid Panel No results found for: CHOL, TRIG, HDL, CHOLHDL, VLDL, LDLCALC, LDLDIRECT  RADIOLOGY:  CT Angio Chest PE W and/or Wo Contrast.  Date: 08/27/2020 CLINICAL DATA: Shortness of breath and chest pain  FINDINGS: Cardiovascular: Satisfactory opacification of the pulmonary arteries to the segmental level. There are filling defects in segmental branches of bilateral upper and right middle lobes. Additional suspected filling defects within subsegmental branches. Left atrial enlargement. No evidence of right heart strain. No pericardial effusion. Mediastinum/Nodes: There are no enlarged lymph nodes identified. Thyroid and esophagus are unremarkable. Lungs/Pleura: Patchy areas of atelectasis. No pleural effusion or pneumothorax. Upper Abdomen: No acute abnormality. Musculoskeletal: No acute osseous abnormality. Review of the MIP images confirms the above findings.  IMPRESSION: Acute segmental and subsegmental pulmonary emboli bilaterally. No evidence of right heart strain. Patchy bilateral atelectasis.  DG Chest 1 View  09/22/2020 CLINICAL DATA:  Left-sided thoracentesis today EXAM: CHEST  1 VIEW COMPARISON:  09/20/2020 FINDINGS: Marked reduction in size of the left pleural effusion. No significant pneumothorax. Mild atelectasis and potentially a small amount of residual effusion at the left lung base. Thoracic spondylosis. Borderline enlargement of the cardiopericardial silhouette  CT scan chest without contrast 09/23/2020: 1. Moderate size pericardial effusion has developed since the previous study. 2. Trace amount of right pleural fluid layering dependently with minimal dependent atelectasis. Small left effusion layering dependently with mild dependent atelectasis. 3. Small pulmonary emboli seen previously cannot be visualized in the absence of contrast. 4. Aortic atherosclerosis.  Coronary artery calcification.  Cardiac database: EKG: 09/19/2020: Atrial flutter, 158 bpm, left axis deviation, poor R wave progression, without underlying injury pattern.  Telemetry: Personally reviewed.  Patient converted to sinus rhythm at 2200  yesterday.  Occasional PACs noted.  Echocardiogram: 08/28/2020:  1. Left ventricular ejection fraction, by estimation, is 50 to 55%. The left ventricle has low normal function. The left ventricle has no regional wall motion abnormalities. Left ventricular diastolic parameters were normal. 2. Right ventricular systolic function is normal. The right ventricular size is normal. There is moderately elevated pulmonary artery systolic pressure. The estimated right ventricular systolic pressure is 40.6 mmHg. 3. The mitral valve is normal in structure. Trivial mitral valve regurgitation. No evidence of mitral stenosis. 4. The aortic valve is normal in structure. Aortic valve regurgitation is not visualized. 5. The inferior vena cava is dilated in size with <50% respiratory variability, suggesting  right atrial pressure of 15 mmHg.  Scheduled Meds: . allopurinol  100 mg Oral q AM  . apixaban  5 mg Oral BID  . aspirin EC  81 mg Oral Daily  . atorvastatin  10 mg Oral Daily  . bumetanide  1 mg Oral BID  . digoxin  0.25 mg Oral Daily  . diltiazem  30 mg Oral Q6H  . doxycycline  100 mg Oral Q12H  . metoprolol tartrate  50 mg Oral BID  . polyethylene glycol  17 g Oral Daily  . potassium chloride  40 mEq Oral Once  . potassium chloride  40 mEq Oral Q4H  . spironolactone  25 mg Oral Daily    Continuous Infusions: . amiodarone 60 mg/hr (09/24/20 3557)   Followed by  . amiodarone 30 mg/hr (09/24/20 0828)  . cefTRIAXone (ROCEPHIN)  IV 2 g (09/23/20 1426)   PRN Meds: acetaminophen, levalbuterol, oxyCODONE-acetaminophen, polyvinyl alcohol   IMPRESSION & RECOMMENDATIONS: Dustin Bumbaugh is a 64 y.o. male whose past medical history and cardiac risk factors include: Pulmonary embolism, atrial flutter/fibrillation, dyslipidemia, hypertension, premature CAD in the family, smokes cigars, obesity due to excess calories.  1.  Atrial fibrillation with rapid ventricular response, paroxysmal. A. FL typical  now in AF. CHA2DS2-VASc Score is 3.  Yearly risk of stroke: 3.8% (HTN, Vasc Dz, CHF).  Score of 1=0.6; 2=2.2; 3=3.2; 4=4.8; 5=7.2; 6=9.8; 7=>9.8) -(CHF; HTN; vasc disease DM,  Male = 1; Age <65 =0; 65-74 = 1,  >75 =2; stroke/embolism= 2).    2. Acute diastolic heart failure, Most likely exacerbated by his underlying symptomatic atrial flutter, pneumonia, moderate/large pleural effusion). 3.  Tobacco use disorder 4.  Moderate obesity 5.  Excess alcohol use, patient endorses that he was drinking excessively for the past 5 years and only quit upon his first admission to the hospital on 08/27/2020. 6.  Moderate pericardial effusion  Recommendation:  Patient was found on IV amiodarone, continue the same.  Restart metoprolol that was discontinued due to low blood pressure, at a lower dose instead of 100 mg p.o. twice daily.  I will also add plain diltiazem 30 mg every 6 hours with parameters to hold for systolic less than 90 mmHg.  Start on digoxin for rate control as well.  Today there is no clinical evidence of heart failure.  Leg edema has completely resolved and lung examination is normal. Although he has moderate pericardial effusion, there is no JVD, no pulses paradoxus, he is not in tamponade.  No indication for aspirating pericardial effusion.  We will continue to follow. Again discussed regarding complete abstinence from tobacco and alcohol.    Yates Decamp, MD, [facility] 09/24/2020, 9:18 AM Office: [phone] Pager: [phone]

## 2020-09-24 NOTE — Progress Notes (Signed)
PROGRESS NOTE    Carl [name]  YPP:[id number] DOB: 1956-10-10 DOA: 09/19/2020 PCP: Heywood Bene, PA-C   Brief Narrative: Carl [name] is a 64 y.o. male with a history of atrial fibrillation, PE, obesity, diastolic heart failure. He presented secondary to dyspnea and was found to have atrial flutter with RVR, left pleural effusion, respiratory failure and evidence of acute heart failure. He has been started on diltiazem/amiodarone drips, IV lasix, heparin drip.   Assessment & Plan:   Principal Problem:   Paroxysmal atrial flutter (HCC) Active Problems:   Primary hypertension   Typical atrial flutter (HCC)   Acute pulmonary embolism (HCC)   Dyslipidemia   Class 1 obesity   Acute on chronic diastolic CHF (congestive heart failure) (HCC)   Gout   Pleural effusion on left   Unspecified atrial fibrillation ([place])   Community acquired pneumonia   Dyspnea   Long term current use of antiarrhythmic drug   Long term (current) use of anticoagulants   Pericardial effusion   Hypokalemia   Atrial flutter with rapid ventricular response Patient states he has been adherent with outpatient regimen except he has not been taking dronedarone secondary to dyspnea. In setting of likely pneumonia. Transitioned to amiodarone PO on 11/11. Patient converted to NSR on 11/11 -Cardiology recommendations: decrease to metoprolol tartrate 50 mg BID, continue amiodarone IV and started Cardizem/digoxin  Acute on chronic diastolic heart failure In setting of above. Associated pleural effusion. -Continue Lasix -Cardiology recommendations: added metolazone  Acute respiratory failure with hypoxia Most likely secondary to pleural effusion. Recent PE could be contributing but not as likely. Placed on 3 L of oxygen. Documented hypoxia with SpO2 of 89%. Weaned to room air at rest.  -Ambulate with pulse oximetry  Pleural effusion Left sided. In setting of rapid atrial flutter and acute heart failure.  Thoracentesis ordered and performed on 11/11. No obvious evidence of infection. At least one of light's criteria met. No serum LDH available. Repeat thoracentesis performed on 11/13 -Follow-up pleural fluid culture (no growth x3 days) -Fluid cytology pending  Pericardial effusion Trace seen on Transthoracic Echocardiogram with moderate seen on CT chest. BP on softer side with antihypertensive titration per cardiology -Cardiology recommendations: limited Transthoracic Echocardiogram pending; no concern for tamponade at this time  Sepsis Present on admission with possible pneumonia as source. Normal lactic acid. Blood and urine cultures obtained on admission. Procalcitonin undetectable. Urine culture significant for 4000 colonies of E. Coli -Follow up blood cultures (no growth to date)  Community acquired pneumonia Patient with fever (102.8 F prior to admission), associated leukocytosis and x-ray findings consistent with possible pneumonia. Patient was started on treatment as an outpatient with Augmentin PO. Started on Ceftriaxone/azithromycin on admission. Leukocytosis improved today.  -Continue Ceftriaxone/doxycycline  UTI -Continue Ceftriaxone  Acute pulmonary embolism Patient is currently being treated with Eliquis as an outpatient. Transitioned to heparin drip inpatient and now on Lovenox per cardiology -Eliquis  Primary hypertension On diltiazem and metoprolol. Medications adjusted as mentioned above. -Continue spironolactone  Hyperlipidemia -Continue Lipitor  Tobacco use Counseled.  Morbid obesity Body mass index is 30.88 kg/m.   DVT prophylaxis: Heparin drip Code Status:   Code Status: Full Code Family Communication: None at bedside Disposition Plan: Discharge home pending cardiology recommendations. Anticipate 48-72 hours   Consultants:   Cardiology  Procedures:   TRANSTHORACIC ECHOCARDIOGRAM (09/21/2020) IMPRESSIONS    1. Left ventricular ejection  fraction, by estimation, is 50%. The left  ventricle has mildly decreased function. The left ventricle demonstrates  global hypokinesis. Left ventricular diastolic parameters are  indeterminate.  2. Right ventricular systolic function is normal. The right ventricular  size is normal. The estimated right ventricular systolic pressure is 92.4  mmHg.  3. Left atrial size was mildly dilated.  4. The mitral valve is normal in structure. Trivial mitral valve  regurgitation. No evidence of mitral stenosis.  5. The aortic valve is tricuspid. Aortic valve regurgitation is not  visualized. Mild aortic valve sclerosis is present, with no evidence of  aortic valve stenosis.  6. The inferior vena cava is dilated in size with <50% respiratory  variability, suggesting right atrial pressure of 15 mmHg.  Antimicrobials:  Ceftriaxone  Azithromycin    Subjective: No dyspnea or chest pain. No palpitations. Overnight, patient converted back to atrial fibrillation with RVR. Started on an amiodarone drip.  Objective: Vitals:   09/24/20 0600 09/24/20 0700 09/24/20 0743 09/24/20 1013  BP: 93/75 94/78 108/88   Pulse: (!) 33 78 (!) 151 (!) 143  Resp: _0 Temp: 98 F (36.7 C) 99 F (37.2 C) 98.5 F (36.9 C)   TempSrc: Oral Oral Oral   SpO2: 94% 94% 92%   Weight:      Height:        Intake/Output Summary (Last 24 hours) at 09/24/2020 1029 Last data filed at 09/24/2020 0859 Gross per 24 hour  Intake --  Output 1700 ml  Net -1700 ml   Filed Weights   09/22/20 0407 09/23/20 0403 09/24/20 0100  Weight: 102.1 kg 98.9 kg 97.6 kg    Examination:  General exam: Appears calm and comfortable Respiratory system: Clear to auscultation. Respiratory effort normal. Cardiovascular system: S1 & S2 heard, Tachycardia with irregular rhythm. No murmurs, rubs, gallops or clicks. Gastrointestinal system: Abdomen is protuberant, soft and nontender. No organomegaly or masses felt. Normal bowel  sounds heard. Central nervous system: Alert and oriented. No focal neurological deficits. Musculoskeletal: No edema. No calf tenderness Skin: No cyanosis. No rashes Psychiatry: Judgement and insight appear normal. Mood & affect appropriate.     Data Reviewed: I have personally reviewed following labs and imaging studies  CBC Lab Results  Component Value Date   WBC 10.7 (H) 09/23/2020   RBC 3.97 (L) 09/23/2020   HGB 12.6 (L) 09/23/2020   HCT 38.3 (L) 09/23/2020   MCV 96.5 09/23/2020   MCH 31.7 09/23/2020   PLT 369 09/23/2020   MCHC 32.9 09/23/2020   RDW 12.4 09/23/2020   LYMPHSABS 0.7 09/19/2020   MONOABS 1.2 (H) 09/19/2020   EOSABS 0.0 09/19/2020   BASOSABS 0.1 46/28/6381     Last metabolic panel Lab Results  Component Value Date   NA 134 (L) 09/24/2020   K 3.1 (L) 09/24/2020   CL 91 (L) 09/24/2020   CO2 34 (H) 09/24/2020   BUN 16 09/24/2020   CREATININE 0.97 09/24/2020   GLUCOSE 117 (H) 09/24/2020   GFRNONAA >60 09/24/2020   CALCIUM 9.0 09/24/2020   PHOS 2.4 (L) 08/30/2020   PROT 6.0 (L) 09/19/2020   ALBUMIN 2.3 (L) 09/19/2020   BILITOT 0.7 09/19/2020   ALKPHOS 92 09/19/2020   AST 15 09/19/2020   ALT 27 09/19/2020   ANIONGAP 9 09/24/2020    CBG (last 3)  No results for input(s): GLUCAP in the last 72 hours.   GFR: Estimated Creatinine Clearance: 90.1 mL/min (by C-G formula based on SCr of 0.97 mg/dL).  Coagulation Profile: Recent Labs  Lab 09/19/20 1133  INR 1.4*    Recent  Results (from the past 240 hour(s))  Blood culture (routine single)     Status: None (Preliminary result)   Collection Time: 09/19/20 11:33 AM   Specimen: BLOOD  Result Value Ref Range Status   Specimen Description BLOOD BLOOD RIGHT HAND  Final   Special Requests   Final    BOTTLES DRAWN AEROBIC AND ANAEROBIC Blood Culture adequate volume   Culture   Final    NO GROWTH 4 DAYS Performed at [facility], [place] [address] [place], [place] [address]    Report Status  PENDING  Incomplete  Urine culture     Status: Abnormal   Collection Time: 09/19/20 11:33 AM   Specimen: In/Out Cath Urine  Result Value Ref Range Status   Specimen Description IN/OUT CATH URINE  Final   Special Requests   Final    NONE Performed at [facility], [place] [address], [place], [place] [address]    Culture 4,000 COLONIES/mL ESCHERICHIA COLI (A)  Final   Report Status 09/21/2020 FINAL  Final   Organism ID, Bacteria ESCHERICHIA COLI (A)  Final      Susceptibility   Escherichia coli - MIC*    AMPICILLIN 8 SENSITIVE Sensitive     CEFAZOLIN <=4 SENSITIVE Sensitive     CEFEPIME <=0.12 SENSITIVE Sensitive     CEFTRIAXONE <=0.25 SENSITIVE Sensitive     CIPROFLOXACIN <=0.25 SENSITIVE Sensitive     GENTAMICIN <=1 SENSITIVE Sensitive     IMIPENEM <=0.25 SENSITIVE Sensitive     NITROFURANTOIN <=16 SENSITIVE Sensitive     TRIMETH/SULFA <=20 SENSITIVE Sensitive     AMPICILLIN/SULBACTAM <=2 SENSITIVE Sensitive     PIP/TAZO <=4 SENSITIVE Sensitive     * 4,000 COLONIES/mL ESCHERICHIA COLI  Culture, blood (single)     Status: None (Preliminary result)   Collection Time: 09/19/20 12:15 PM   Specimen: BLOOD LEFT FOREARM  Result Value Ref Range Status   Specimen Description BLOOD LEFT FOREARM  Final   Special Requests   Final    BOTTLES DRAWN AEROBIC AND ANAEROBIC Blood Culture adequate volume   Culture   Final    NO GROWTH 4 DAYS Performed at [facility], [address], [place], [place] [address]    Report Status PENDING  Incomplete  Respiratory Panel by RT PCR (Flu A&B, Covid) - Nasopharyngeal Swab     Status: None   Collection Time: 09/19/20  1:11 PM   Specimen: Nasopharyngeal Swab  Result Value Ref Range Status   SARS Coronavirus 2 by RT PCR NEGATIVE NEGATIVE Final    Comment: (NOTE) SARS-CoV-2 target nucleic acids are NOT DETECTED.  The SARS-CoV-2 RNA is generally detectable in upper respiratoy specimens during the acute phase of infection. The  lowest concentration of SARS-CoV-2 viral copies this assay can detect is 131 copies/mL. A negative result does not preclude SARS-Cov-2 infection and should not be used as the sole basis for treatment or other patient management decisions. A negative result may occur with  improper specimen collection/handling, submission of specimen other than nasopharyngeal swab, presence of viral mutation(s) within the areas targeted by this assay, and inadequate number of viral copies (<131 copies/mL). A negative result must be combined with clinical observations, patient history, and epidemiological information. The expected result is Negative.  Fact Sheet for Patients:  Carl [name]  Fact Sheet for Healthcare Providers:  [employer]ravel[employer]it  This test is no t yet approved or cleared by the [place] FDA and  has been authorized for detection and/or  diagnosis of SARS-CoV-2 by FDA under an Emergency Use Authorization (EUA). This EUA will remain  in effect (meaning this test can be used) for the duration of the COVID-19 declaration under Section 564(b)(1) of the Act, 21 U.S.C. section 360bbb-3(b)(1), unless the authorization is terminated or revoked sooner.     Influenza A by PCR NEGATIVE NEGATIVE Final   Influenza B by PCR NEGATIVE NEGATIVE Final    Comment: (NOTE) The Xpert Xpress SARS-CoV-2/FLU/RSV assay is intended as an aid in  the diagnosis of influenza from Nasopharyngeal swab specimens and  should not be used as a sole basis for treatment. Nasal washings and  aspirates are unacceptable for Xpert Xpress SARS-CoV-2/FLU/RSV  testing.  Fact Sheet for Patients: Carl [name]  Fact Sheet for Healthcare Providers: [employer]  This test is not yet approved or cleared by the [place] FDA and  has been authorized for detection and/or diagnosis of SARS-CoV-2 by  FDA under  an Emergency Use Authorization (EUA). This EUA will remain  in effect (meaning this test can be used) for the duration of the  Covid-19 declaration under Section 564(b)(1) of the Act, 21  U.S.C. section 360bbb-3(b)(1), unless the authorization is  terminated or revoked. Performed at [facility], [place] [address], [place], [place] [address]   Culture, body fluid-bottle     Status: None (Preliminary result)   Collection Time: 09/20/20  8:35 AM   Specimen: Pleura  Result Value Ref Range Status   Specimen Description PLEURAL FLUID  Final   Special Requests LEFT LUNG  Final   Culture   Final    NO GROWTH 3 DAYS Performed at [place] [address] [place], [place] [address]    Report Status PENDING  Incomplete  Gram stain     Status: None   Collection Time: 09/20/20  8:35 AM   Specimen: Pleura  Result Value Ref Range Status   Specimen Description PLEURAL FLUID  Final   Special Requests LEFT LUNG  Final   Gram Stain   Final    WBC PRESENT,BOTH PMN AND MONONUCLEAR NO ORGANISMS SEEN CYTOSPIN SMEAR Performed at [facility], [address], [place], [place] [address]    Report Status 09/20/2020 FINAL  Final  Expectorated sputum assessment w rflx to resp cult     Status: None   Collection Time: 09/22/20  3:44 PM   Specimen: Expectorated Sputum  Result Value Ref Range Status   Specimen Description EXPECTORATED SPUTUM  Final   Special Requests NONE  Final   Sputum evaluation   Final    THIS SPECIMEN IS ACCEPTABLE FOR SPUTUM CULTURE Performed at [facility], [place] [address] [place], [place] [address]    Report Status 09/22/2020 FINAL  Final  Culture, respiratory     Status: None (Preliminary result)   Collection Time: 09/22/20  3:44 PM  Result Value Ref Range Status   Specimen Description EXPECTORATED SPUTUM  Final   Special Requests NONE Reflexed from [phone]  Final   Gram Stain   Final    RARE WBC PRESENT, PREDOMINANTLY PMN RARE GRAM POSITIVE COCCI  IN PAIRS RARE GRAM NEGATIVE RODS    Culture   Final    CULTURE REINCUBATED FOR BETTER GROWTH Performed at [facility], [place] [address], [place], [place] [address]    Report Status PENDING  Incomplete        Radiology Studies: DG Chest 1 View  Result Date: 09/22/2020 CLINICAL DATA:  Left-sided  thoracentesis today EXAM: CHEST  1 VIEW COMPARISON:  09/20/2020 FINDINGS: Marked reduction in size of the left pleural effusion. No significant pneumothorax. Mild atelectasis and potentially a small amount of residual effusion at the left lung base. Thoracic spondylosis. Borderline enlargement of the cardiopericardial silhouette. IMPRESSION: 1. Marked reduction in size of the left pleural effusion status post thoracentesis. No pneumothorax. 2. Borderline enlargement of the cardiopericardial silhouette. Electronically Signed   By: Van Clines M.D.   On: 09/22/2020 14:30   CT CHEST WO CONTRAST  Result Date: 09/23/2020 CLINICAL DATA:  Follow-up left-sided thoracentesis. Pleural effusion. Persistent cough. EXAM: CT CHEST WITHOUT CONTRAST TECHNIQUE: Multidetector CT imaging of the chest was performed following the standard protocol without IV contrast. COMPARISON:  08/27/2020 FINDINGS: Cardiovascular: Moderate size pericardial effusion has developed since the previous study. Extensive coronary artery calcification again evident. Aortic atherosclerotic calcification again evident. Small pulmonary emboli seen previously cannot be visualized in the absence of contrast. Mediastinum/Nodes: No mediastinal or hilar mass or lymphadenopathy. Lungs/Pleura: Trace amount right pleural fluid layering dependently with minimal dependent atelectasis. Small left effusion layering dependently with mild dependent adjacent atelectasis. Non dependent aerated lung appears normal. Upper Abdomen: No acute or significant upper abdominal finding. Musculoskeletal: Ordinary thoracic degenerative changes. IMPRESSION: 1.  Moderate size pericardial effusion has developed since the previous study. 2. Trace amount of right pleural fluid layering dependently with minimal dependent atelectasis. Small left effusion layering dependently with mild dependent atelectasis. 3. Small pulmonary emboli seen previously cannot be visualized in the absence of contrast. 4. Aortic atherosclerosis.  Coronary artery calcification. Aortic Atherosclerosis (ICD10-I70.0). Electronically Signed   By: Nelson Chimes M.D.   On: 09/23/2020 13:06   US THORACENTESIS ASP PLEURAL SPACE W/IMG GUIDE  Result Date: 09/23/2020 INDICATION: Diastolic heart failure with left pleural effusion. Request for diagnostic and therapeutic thoracentesis. EXAM: ULTRASOUND GUIDED LEFT THORACENTESIS MEDICATIONS: 1% lidocaine 10 mL COMPLICATIONS: None immediate. PROCEDURE: An ultrasound guided thoracentesis was thoroughly discussed with the patient and questions answered. The benefits, risks, alternatives and complications were also discussed. The patient understands and wishes to proceed with the procedure. Written consent was obtained. Ultrasound was performed to localize and mark an adequate pocket of fluid in the left chest. The area was then prepped and draped in the normal sterile fashion. 1% Lidocaine was used for local anesthesia. Under ultrasound guidance a 6 Fr Safe-T-Centesis catheter was introduced. Thoracentesis was performed. The catheter was removed and a dressing applied. FINDINGS: A total of approximately 650 mL of hazy peach colored fluid was removed. Samples were sent to the laboratory as requested by the clinical team. IMPRESSION: Successful ultrasound guided left thoracentesis yielding 650 mL of pleural fluid. No pneumothorax on post-procedure chest x-ray. Read by: Gareth Eagle, PA-C Electronically Signed   By: Aletta Edouard M.D.   On: 09/23/2020 11:20        Scheduled Meds: . allopurinol  100 mg Oral q AM  . apixaban  5 mg Oral BID  . aspirin EC  81 mg  Oral Daily  . atorvastatin  10 mg Oral Daily  . bumetanide  1 mg Oral BID  . digoxin  0.25 mg Oral Daily  . diltiazem  30 mg Oral Q6H  . doxycycline  100 mg Oral Q12H  . metoprolol tartrate  50 mg Oral BID  . polyethylene glycol  17 g Oral Daily  . potassium chloride  40 mEq Oral Q4H  . spironolactone  25 mg Oral Daily   Continuous Infusions: . amiodarone 30 mg/hr (  09/24/20 0828)  . cefTRIAXone (ROCEPHIN)  IV 2 g (09/23/20 1426)     LOS: 5 days     Cordelia [place]oche, MD [facility] 09/24/2020, 10:29 AM  If 7PM-7AM, please contact night-coverage [email or web address]

## 2020-09-24 NOTE — Progress Notes (Signed)
   09/23/20 1936  Assess: MEWS Score  Temp 99.6 F (37.6 C)  BP 125/64  Pulse Rate 93  ECG Heart Rate (!) 159  Resp 18  SpO2 95 %  Assess: MEWS Score  MEWS Temp 0  MEWS Systolic 0  MEWS Pulse 3  MEWS RR 0  MEWS LOC 0  MEWS Score 3  MEWS Score Color Yellow  Assess: if the MEWS score is Yellow or Red  Were vital signs taken at a resting state? Yes  Focused Assessment No change from prior assessment  Early Detection of Sepsis Score *See Row Information* Low  MEWS guidelines implemented *See Row Information* Yes  Treat  MEWS Interventions Administered prn meds/treatments  Pain Scale 0-10  Pain Score 0  Take Vital Signs  Increase Vital Sign Frequency  Yellow: Q 2hr X 2 then Q 4hr X 2, if remains yellow, continue Q 4hrs  Notify: Charge Nurse/RN  Name of Charge Nurse/RN Notified Same RN  Notify: Provider  Provider Name/Title Dr Loney Loh  Date Provider Notified 09/23/20  Time Provider Notified 2030  Notification Type Page  Notification Reason Other (Comment) (Back to RVR)  Response See new orders  Date of Provider Response 09/23/20  Document  Patient Outcome Other (Comment) (HR dr)  Progress note created (see row info) Yes  Lopressor IV given as PRN

## 2020-09-24 NOTE — Progress Notes (Signed)
Dr Loney Loh is aware with Pt BP.  Will notify her if MAP below 65 as per her. PT has no complains. Denies Dizziness, Sleepy but wakes easy and alert.

## 2020-09-24 NOTE — Progress Notes (Signed)
  Echocardiogram 2D Echocardiogram has been performed.  Carl [name] 09/24/2020, 11:46 AM

## 2020-09-25 ENCOUNTER — Other Ambulatory Visit (HOSPITAL_COMMUNITY): Payer: Federal, State, Local not specified - PPO

## 2020-09-25 ENCOUNTER — Other Ambulatory Visit: Payer: Self-pay | Admitting: Cardiology

## 2020-09-25 ENCOUNTER — Encounter (HOSPITAL_COMMUNITY)
Admission: EM | Disposition: A | Discharge status: Home / Self Care | Payer: Self-pay | Source: Home / Self Care | Attending: Family Medicine

## 2020-09-25 ENCOUNTER — Encounter (HOSPITAL_COMMUNITY): Payer: Self-pay | Admitting: Cardiology

## 2020-09-25 DIAGNOSIS — I483 Typical atrial flutter: Secondary | ICD-10-CM

## 2020-09-25 DIAGNOSIS — I480 Paroxysmal atrial fibrillation: Secondary | ICD-10-CM

## 2020-09-25 HISTORY — PX: LEFT HEART CATH AND CORONARY ANGIOGRAPHY: CATH118249

## 2020-09-25 LAB — CULTURE, RESPIRATORY W GRAM STAIN: Culture: NORMAL

## 2020-09-25 LAB — BASIC METABOLIC PANEL
Anion gap: 9 (ref 5–15)
BUN: 14 mg/dL (ref 8–23)
CO2: 32 mmol/L (ref 22–32)
Calcium: 9 mg/dL (ref 8.9–10.3)
Chloride: 95 mmol/L — ABNORMAL LOW (ref 98–111)
Creatinine, Ser: 0.94 mg/dL (ref 0.61–1.24)
GFR, Estimated: >60 mL/min (ref >60)
Glucose, Bld: 116 mg/dL — ABNORMAL HIGH (ref 70–99)
Potassium: 4 mmol/L (ref 3.5–5.1)
Sodium: 136 mmol/L (ref 135–145)

## 2020-09-25 LAB — CULTURE, BODY FLUID W GRAM STAIN -BOTTLE: Culture: NO GROWTH

## 2020-09-25 LAB — ECHOCARDIOGRAM LIMITED
Height: 70 in
S' Lateral: 3.8 cm
Weight: 3716.8 oz

## 2020-09-25 LAB — PROTIME-INR
INR: 1.4 — ABNORMAL HIGH (ref 0.8–1.2)
Prothrombin Time: 17 seconds — ABNORMAL HIGH (ref 11.4–15.2)

## 2020-09-25 LAB — BRAIN NATRIURETIC PEPTIDE: B Natriuretic Peptide: 235.7 pg/mL — ABNORMAL HIGH (ref 0.0–100.0)

## 2020-09-25 SURGERY — ECHOCARDIOGRAM, TRANSESOPHAGEAL
Anesthesia: Monitor Anesthesia Care

## 2020-09-25 SURGERY — LEFT HEART CATH AND CORONARY ANGIOGRAPHY
Anesthesia: LOCAL

## 2020-09-25 MED ORDER — MIDAZOLAM HCL 2 MG/2ML IJ SOLN
INTRAMUSCULAR | Status: DC | PRN
  Administered 2020-09-25: 1 mg via INTRAVENOUS

## 2020-09-25 MED ORDER — LABETALOL HCL 5 MG/ML IV SOLN: 10.0000 mg | INTRAVENOUS | Status: AC | PRN

## 2020-09-25 MED ORDER — SODIUM CHLORIDE 0.9 % IV SOLN: INTRAVENOUS | Status: AC

## 2020-09-25 MED ORDER — METOPROLOL SUCCINATE ER 50 MG PO TB24
50.0000 mg | ORAL_TABLET | Freq: Every day | ORAL | Status: DC
  Administered 2020-09-25 – 2020-09-26 (×2): 50 mg via ORAL
  Filled 2020-09-25 (×3): qty 1

## 2020-09-25 MED ORDER — HYDRALAZINE HCL 20 MG/ML IJ SOLN: 10.0000 mg | INTRAMUSCULAR | Status: AC | PRN

## 2020-09-25 MED ORDER — VERAPAMIL HCL 2.5 MG/ML IV SOLN
INTRAVENOUS | Status: AC
  Filled 2020-09-25: qty 2

## 2020-09-25 MED ORDER — SODIUM CHLORIDE 0.9% FLUSH: 3.0000 mL | Freq: Two times a day (BID) | INTRAVENOUS | Status: DC

## 2020-09-25 MED ORDER — METOPROLOL TARTRATE 5 MG/5ML IV SOLN
INTRAVENOUS | Status: DC | PRN
  Administered 2020-09-25: 2.5 mg via INTRAVENOUS

## 2020-09-25 MED ORDER — LIDOCAINE HCL (PF) 1 % IJ SOLN
INTRAMUSCULAR | Status: DC | PRN
  Administered 2020-09-25: 2 mL

## 2020-09-25 MED ORDER — SODIUM CHLORIDE 0.9 % IV SOLN: 250.0000 mL | INTRAVENOUS | Status: DC | PRN

## 2020-09-25 MED ORDER — SODIUM CHLORIDE 0.9 % IV SOLN: INTRAVENOUS | Status: DC

## 2020-09-25 MED ORDER — METOPROLOL TARTRATE 25 MG PO TABS: 25.0000 mg | ORAL_TABLET | Freq: Two times a day (BID) | ORAL | Status: DC

## 2020-09-25 MED ORDER — LIDOCAINE HCL (PF) 1 % IJ SOLN
INTRAMUSCULAR | Status: AC
  Filled 2020-09-25: qty 30

## 2020-09-25 MED ORDER — IOHEXOL 350 MG/ML SOLN
INTRAVENOUS | Status: DC | PRN
  Administered 2020-09-25: 30 mL

## 2020-09-25 MED ORDER — HEPARIN (PORCINE) IN NACL 1000-0.9 UT/500ML-% IV SOLN
INTRAVENOUS | Status: AC
  Filled 2020-09-25: qty 1000

## 2020-09-25 MED ORDER — METOPROLOL TARTRATE 5 MG/5ML IV SOLN
INTRAVENOUS | Status: AC
  Filled 2020-09-25: qty 5

## 2020-09-25 MED ORDER — MIDAZOLAM HCL 2 MG/2ML IJ SOLN
INTRAMUSCULAR | Status: AC
  Filled 2020-09-25: qty 2

## 2020-09-25 MED ORDER — ASPIRIN 81 MG PO CHEW: 81.0000 mg | CHEWABLE_TABLET | ORAL | Status: DC

## 2020-09-25 MED ORDER — SODIUM CHLORIDE 0.9% FLUSH: 3.0000 mL | INTRAVENOUS | Status: DC | PRN

## 2020-09-25 MED ORDER — VERAPAMIL HCL 2.5 MG/ML IV SOLN
INTRAVENOUS | Status: DC | PRN
  Administered 2020-09-25: 10 mL via INTRA_ARTERIAL

## 2020-09-25 MED ORDER — ACETAMINOPHEN 325 MG PO TABS: 650.0000 mg | ORAL_TABLET | ORAL | Status: DC | PRN

## 2020-09-25 MED ORDER — FENTANYL CITRATE (PF) 100 MCG/2ML IJ SOLN
INTRAMUSCULAR | Status: DC | PRN
  Administered 2020-09-25: 50 ug via INTRAVENOUS

## 2020-09-25 MED ORDER — AMIODARONE HCL 200 MG PO TABS
200.0000 mg | ORAL_TABLET | Freq: Two times a day (BID) | ORAL | Status: DC
  Administered 2020-09-25: 200 mg via ORAL
  Filled 2020-09-25: qty 1

## 2020-09-25 MED ORDER — FENTANYL CITRATE (PF) 100 MCG/2ML IJ SOLN
INTRAMUSCULAR | Status: AC
  Filled 2020-09-25: qty 2

## 2020-09-25 MED ORDER — HEPARIN (PORCINE) IN NACL 1000-0.9 UT/500ML-% IV SOLN
INTRAVENOUS | Status: DC | PRN
  Administered 2020-09-25 (×2): 500 mL

## 2020-09-25 MED ORDER — ONDANSETRON HCL 4 MG/2ML IJ SOLN: 4.0000 mg | Freq: Four times a day (QID) | INTRAMUSCULAR | Status: DC | PRN

## 2020-09-25 MED ORDER — SODIUM CHLORIDE 0.9 % IV SOLN
INTRAVENOUS | Status: DC
  Administered 2020-09-25: 250 mL/h via INTRAVENOUS

## 2020-09-25 SURGICAL SUPPLY — 11 items
CATH INFINITI JR4 5F (CATHETERS) ×1 IMPLANT
CATH OPTITORQUE TIG 4.0 5F (CATHETERS) ×1 IMPLANT
DEVICE RAD COMP TR BAND LRG (VASCULAR PRODUCTS) ×1 IMPLANT
GLIDESHEATH SLEND A-KIT 6F 22G (SHEATH) ×1 IMPLANT
GLIDESHEATH SLEND SS 6F .021 (SHEATH) ×1 IMPLANT
GUIDEWIRE INQWIRE 1.5J.035X260 (WIRE) IMPLANT
INQWIRE 1.5J .035X260CM (WIRE) ×2
KIT HEART LEFT (KITS) ×2 IMPLANT
PACK CARDIAC CATHETERIZATION (CUSTOM PROCEDURE TRAY) ×2 IMPLANT
TRANSDUCER W/STOPCOCK (MISCELLANEOUS) ×2 IMPLANT
TUBING CIL FLEX 10 FLL-RA (TUBING) ×2 IMPLANT

## 2020-09-25 NOTE — Progress Notes (Signed)
PROGRESS NOTE    Carl [name]  BBC:[id number] DOB: [birth year] DOA: 09/19/2020 PCP: Heywood Bene, PA-C   Brief Narrative: Carl [name] is a 64 y.o. male with a history of atrial fibrillation, PE, obesity, diastolic heart failure. He presented secondary to dyspnea and was found to have atrial flutter with RVR, left pleural effusion, respiratory failure and evidence of acute heart failure. He has been started on diltiazem/amiodarone drips, IV lasix, heparin drip.   Assessment & Plan:   Principal Problem:   Paroxysmal atrial flutter (HCC) Active Problems:   Primary hypertension   Typical atrial flutter (HCC)   Acute pulmonary embolism (HCC)   Dyslipidemia   Class 1 obesity   Acute on chronic diastolic CHF (congestive heart failure) (HCC)   Gout   Pleural effusion on left   Unspecified atrial fibrillation ([place])   Community acquired pneumonia   Dyspnea   Long term current use of antiarrhythmic drug   Long term (current) use of anticoagulants   Pericardial effusion   Hypokalemia   Atrial flutter with rapid ventricular response Atrial fibrillation with RVR Patient states he has been adherent with outpatient regimen except he has not been taking dronedarone secondary to dyspnea. In setting of likely pneumonia. Transitioned to amiodarone PO on 11/11. Patient converted to NSR on 11/11 and re-converted to atrial fibrillation on 11/15. Now back in NSR from 11/15 evening -Cardiology recommendations: decreased to metoprolol tartrate 50 mg BID, continue amiodarone IV and started Cardizem/digoxin; plan for Transesophageal Echocardiogram/cardioversion  Acute on chronic diastolic heart failure In setting of above. Associated pleural effusion. -Continue Lasix -Cardiology recommendations: added metolazone which has been stopped  Acute respiratory failure with hypoxia Most likely secondary to pleural effusion. Recent PE could be contributing but not as likely. Placed on 3 L of  oxygen. Documented hypoxia with SpO2 of 89%. Weaned to room air at rest.  -Ambulate with pulse oximetry  Pleural effusion Left sided. In setting of rapid atrial flutter and acute heart failure. Thoracentesis ordered and performed on 11/11. No obvious evidence of infection. At least one of light's criteria met. No serum LDH available. Repeat thoracentesis performed on 11/13 -Follow-up pleural fluid culture (no growth x3 days) -Fluid cytology pending  Pericardial effusion Trace seen on Transthoracic Echocardiogram with moderate seen on CT chest. BP on softer side with antihypertensive titration per cardiology -Cardiology recommendations: limited Transthoracic Echocardiogram pending; no concern for tamponade at this time  Sepsis Present on admission with possible pneumonia as source. Normal lactic acid. Blood and urine cultures obtained on admission. Procalcitonin undetectable. Urine culture significant for 4000 colonies of E. Coli -Follow up blood cultures (no growth to date)  Community acquired pneumonia Patient with fever (102.8 F prior to admission), associated leukocytosis and x-ray findings consistent with possible pneumonia. Patient was started on treatment as an outpatient with Augmentin PO. Started on Ceftriaxone/azithromycin on admission. Leukocytosis improved today.  -Continue Ceftriaxone/doxycycline  UTI -Continue Ceftriaxone  Acute pulmonary embolism Patient is currently being treated with Eliquis as an outpatient. Transitioned to heparin drip inpatient and now on Lovenox per cardiology -Eliquis  Primary hypertension On diltiazem and metoprolol. Medications adjusted as mentioned above. -Continue spironolactone  Left arm redness and swelling Appears to be secondary to IV infiltration. -Left wrist x-ray to rule out significant infection  Hyperlipidemia -Continue Lipitor  Tobacco use Counseled.  Morbid obesity Body mass index is 31.12 kg/m.   DVT prophylaxis:  Heparin drip Code Status:   Code Status: Full Code Family Communication: None at bedside Disposition  Plan: Discharge home pending cardiology recommendations. Anticipate 24-72 hours   Consultants:   Cardiology  Procedures:   TRANSTHORACIC ECHOCARDIOGRAM (09/21/2020) IMPRESSIONS    1. Left ventricular ejection fraction, by estimation, is 50%. The left  ventricle has mildly decreased function. The left ventricle demonstrates  global hypokinesis. Left ventricular diastolic parameters are  indeterminate.  2. Right ventricular systolic function is normal. The right ventricular  size is normal. The estimated right ventricular systolic pressure is 41.9  mmHg.  3. Left atrial size was mildly dilated.  4. The mitral valve is normal in structure. Trivial mitral valve  regurgitation. No evidence of mitral stenosis.  5. The aortic valve is tricuspid. Aortic valve regurgitation is not  visualized. Mild aortic valve sclerosis is present, with no evidence of  aortic valve stenosis.  6. The inferior vena cava is dilated in size with <50% respiratory  variability, suggesting right atrial pressure of 15 mmHg.  Antimicrobials:  Ceftriaxone  Azithromycin    Subjective: No dyspnea or chest pain. Some left wrist pain.  Objective: Vitals:   09/25/20 0029 09/25/20 0400 09/25/20 0600 09/25/20 1125  BP: 105/66  91/64 (!) 88/61  Pulse: (!) 140  72 69  Resp:   17 18  Temp:  98.4 F (36.9 C)  98.8 F (37.1 C)  TempSrc:  Oral  Oral  SpO2:   92% 93%  Weight:  98.4 kg    Height:        Intake/Output Summary (Last 24 hours) at 09/25/2020 1158 Last data filed at 09/25/2020 0540 Gross per 24 hour  Intake 1005.7 ml  Output 725 ml  Net 280.7 ml   Filed Weights   09/23/20 0403 09/24/20 0100 09/25/20 0400  Weight: 98.9 kg 97.6 kg 98.4 kg    Examination:  General exam: Appears calm and comfortable Respiratory system: Clear to auscultation. Respiratory effort  normal. Cardiovascular system: S1 & S2 heard, RRR. No murmurs, rubs, gallops or clicks. Gastrointestinal system: Abdomen is protuberant , soft and nontender. No organomegaly or masses felt. Normal bowel sounds heard. Central nervous system: Alert and oriented. No focal neurological deficits. Musculoskeletal: No calf tenderness. Left dorsal aspect of wrist with erythema and significant tenderness. Associated edema. Skin: No cyanosis. No rashes Psychiatry: Judgement and insight appear normal. Mood & affect appropriate.     Data Reviewed: I have personally reviewed following labs and imaging studies  CBC Lab Results  Component Value Date   WBC 10.7 (H) 09/23/2020   RBC 3.97 (L) 09/23/2020   HGB 12.6 (L) 09/23/2020   HCT 38.3 (L) 09/23/2020   MCV 96.5 09/23/2020   MCH 31.7 09/23/2020   PLT 369 09/23/2020   MCHC 32.9 09/23/2020   RDW 12.4 09/23/2020   LYMPHSABS 0.7 09/19/2020   MONOABS 1.2 (H) 09/19/2020   EOSABS 0.0 09/19/2020   BASOSABS 0.1 62/22/9798     Last metabolic panel Lab Results  Component Value Date   NA 136 09/25/2020   K 4.0 09/25/2020   CL 95 (L) 09/25/2020   CO2 32 09/25/2020   BUN 14 09/25/2020   CREATININE 0.94 09/25/2020   GLUCOSE 116 (H) 09/25/2020   GFRNONAA >60 09/25/2020   CALCIUM 9.0 09/25/2020   PHOS 2.4 (L) 08/30/2020   PROT 6.0 (L) 09/19/2020   ALBUMIN 2.3 (L) 09/19/2020   BILITOT 0.7 09/19/2020   ALKPHOS 92 09/19/2020   AST 15 09/19/2020   ALT 27 09/19/2020   ANIONGAP 9 09/25/2020    CBG (last 3)  No results for input(s):  GLUCAP in the last 72 hours.   GFR: Estimated Creatinine Clearance: 93.4 mL/min (by C-G formula based on SCr of 0.94 mg/dL).  Coagulation Profile: Recent Labs  Lab 09/19/20 1133 09/25/20 0506  INR 1.4* 1.4*    Recent Results (from the past 240 hour(s))  Blood culture (routine single)     Status: None   Collection Time: 09/19/20 11:33 AM   Specimen: BLOOD  Result Value Ref Range Status   Specimen  Description BLOOD BLOOD RIGHT HAND  Final   Special Requests   Final    BOTTLES DRAWN AEROBIC AND ANAEROBIC Blood Culture adequate volume   Culture   Final    NO GROWTH 5 DAYS Performed at [facility], [address] [place], [place] [address]    Report Status 09/24/2020 FINAL  Final  Urine culture     Status: Abnormal   Collection Time: 09/19/20 11:33 AM   Specimen: In/Out Cath Urine  Result Value Ref Range Status   Specimen Description IN/OUT CATH URINE  Final   Special Requests   Final    NONE Performed at [facility], [place] [address] [place], [place] [address]    Culture 4,000 COLONIES/mL ESCHERICHIA COLI (A)  Final   Report Status 09/21/2020 FINAL  Final   Organism ID, Bacteria ESCHERICHIA COLI (A)  Final      Susceptibility   Escherichia coli - MIC*    AMPICILLIN 8 SENSITIVE Sensitive     CEFAZOLIN <=4 SENSITIVE Sensitive     CEFEPIME <=0.12 SENSITIVE Sensitive     CEFTRIAXONE <=0.25 SENSITIVE Sensitive     CIPROFLOXACIN <=0.25 SENSITIVE Sensitive     GENTAMICIN <=1 SENSITIVE Sensitive     IMIPENEM <=0.25 SENSITIVE Sensitive     NITROFURANTOIN <=16 SENSITIVE Sensitive     TRIMETH/SULFA <=20 SENSITIVE Sensitive     AMPICILLIN/SULBACTAM <=2 SENSITIVE Sensitive     PIP/TAZO <=4 SENSITIVE Sensitive     * 4,000 COLONIES/mL ESCHERICHIA COLI  Culture, blood (single)     Status: None   Collection Time: 09/19/20 12:15 PM   Specimen: BLOOD LEFT FOREARM  Result Value Ref Range Status   Specimen Description BLOOD LEFT FOREARM  Final   Special Requests   Final    BOTTLES DRAWN AEROBIC AND ANAEROBIC Blood Culture adequate volume   Culture   Final    NO GROWTH 5 DAYS Performed at [facility], [address] [place], [place] [address]    Report Status 09/24/2020 FINAL  Final  Respiratory Panel by RT PCR (Flu A&B, Covid) - Nasopharyngeal Swab     Status: None   Collection Time: 09/19/20  1:11 PM   Specimen: Nasopharyngeal Swab  Result Value Ref Range  Status   SARS Coronavirus 2 by RT PCR NEGATIVE NEGATIVE Final    Comment: (NOTE) SARS-CoV-2 target nucleic acids are NOT DETECTED.  The SARS-CoV-2 RNA is generally detectable in upper respiratoy specimens during the acute phase of infection. The lowest concentration of SARS-CoV-2 viral copies this assay can detect is 131 copies/mL. A negative result does not preclude SARS-Cov-2 infection and should not be used as the sole basis for treatment or other patient management decisions. A negative result may occur with  improper specimen collection/handling, submission of specimen other than nasopharyngeal swab, presence of viral mutation(s) within the areas targeted by this assay, and inadequate number of viral copies (<131 copies/mL). A negative result must be combined with clinical observations, patient history, and epidemiological information. The expected result  is Negative.  Fact Sheet for Patients:  PinkCheek.be  Fact Sheet for Healthcare Providers:  GravelBags.it  This test is no t yet approved or cleared by the [place] FDA and  has been authorized for detection and/or diagnosis of SARS-CoV-2 by FDA under an Emergency Use Authorization (EUA). This EUA will remain  in effect (meaning this test can be used) for the duration of the COVID-19 declaration under Section 564(b)(1) of the Act, 21 U.S.C. section 360bbb-3(b)(1), unless the authorization is terminated or revoked sooner.     Influenza A by PCR NEGATIVE NEGATIVE Final   Influenza B by PCR NEGATIVE NEGATIVE Final    Comment: (NOTE) The Xpert Xpress SARS-CoV-2/FLU/RSV assay is intended as an aid in  the diagnosis of influenza from Nasopharyngeal swab specimens and  should not be used as a sole basis for treatment. Nasal washings and  aspirates are unacceptable for Xpert Xpress SARS-CoV-2/FLU/RSV  testing.  Fact Sheet for  Patients: PinkCheek.be  Fact Sheet for Healthcare Providers: GravelBags.it  This test is not yet approved or cleared by the [place] FDA and  has been authorized for detection and/or diagnosis of SARS-CoV-2 by  FDA under an Emergency Use Authorization (EUA). This EUA will remain  in effect (meaning this test can be used) for the duration of the  Covid-19 declaration under Section 564(b)(1) of the Act, 21  U.S.C. section 360bbb-3(b)(1), unless the authorization is  terminated or revoked. Performed at [facility], [place] [address] [place], [place] [address]   Culture, body fluid-bottle     Status: None (Preliminary result)   Collection Time: 09/20/20  8:35 AM   Specimen: Pleura  Result Value Ref Range Status   Specimen Description PLEURAL FLUID  Final   Special Requests LEFT LUNG  Final   Culture   Final    NO GROWTH 4 DAYS Performed at [place] [address], [place], [place] [address]    Report Status PENDING  Incomplete  Gram stain     Status: None   Collection Time: 09/20/20  8:35 AM   Specimen: Pleura  Result Value Ref Range Status   Specimen Description PLEURAL FLUID  Final   Special Requests LEFT LUNG  Final   Gram Stain   Final    WBC PRESENT,BOTH PMN AND MONONUCLEAR NO ORGANISMS SEEN CYTOSPIN SMEAR Performed at [facility], [address], [place], [place] [address]    Report Status 09/20/2020 FINAL  Final  Expectorated sputum assessment w rflx to resp cult     Status: None   Collection Time: 09/22/20  3:44 PM   Specimen: Expectorated Sputum  Result Value Ref Range Status   Specimen Description EXPECTORATED SPUTUM  Final   Special Requests NONE  Final   Sputum evaluation   Final    THIS SPECIMEN IS ACCEPTABLE FOR SPUTUM CULTURE Performed at [facility], [place] [address], [place], [place] [address]    Report Status 09/22/2020 FINAL  Final  Culture, respiratory      Status: None   Collection Time: 09/22/20  3:44 PM  Result Value Ref Range Status   Specimen Description EXPECTORATED SPUTUM  Final   Special Requests NONE Reflexed from [phone]  Final   Gram Stain   Final    RARE WBC PRESENT, PREDOMINANTLY PMN RARE GRAM POSITIVE COCCI IN PAIRS RARE GRAM NEGATIVE RODS    Culture   Final    FEW Normal respiratory flora-no Staph aureus or Pseudomonas seen  Performed at [facility], [place] [address] [place], [place] [address]    Report Status 09/25/2020 FINAL  Final        Radiology Studies: ECHOCARDIOGRAM LIMITED  Addendum Date: 09/24/2020   Correction: Low LVEF new since previous study on 09/21/2020. [email or web address] Electronically Amended 09/24/2020, 1:23 PM   Final Loreli Dollar)    Result Date: 09/24/2020    ECHOCARDIOGRAM LIMITED REPORT   Patient Name:   Carl [name] Date of Exam: 09/24/2020 Medical Rec #:  [id number]     Height:       70.0 in Accession #:    [id number]    Weight:       215.2 lb Date of Birth:  27-Dec-1955     BSA:          2.153 m Patient Age:    2 years      BP:           108/88 mmHg Patient Gender: M             HR:           131 bpm. Exam Location:  Inpatient Procedure: Limited Echo, Limited Color Doppler and Cardiac Doppler Indications:     Pericardial effusion 423.9 / I31.3  History:         Patient has prior history of Echocardiogram examinations, most                  recent 09/21/2020. CHF, Arrythmias:Atrial Fibrillation and                  Atrial Flutter, Signs/Symptoms:Dyspnea; Risk                  Factors:Hypertension and Current Smoker. Pulmonary embolism.                  Pericardial effusion.  Sonographer:     Vickie Epley RDCS Referring Phys:  [id number] Rex Kras Diagnosing Phys: Vernell Leep MD IMPRESSIONS  1. Left ventricular ejection fraction, by estimation, is 20 to 25%. The left ventricle has severely decreased function. The left ventricle demonstrates global hypokinesis.  2. No significant pericardial effusion      Moderate left sided pleural effusion.  3. The inferior vena cava is normal in size with greater than 50% respiratory variability, suggesting right atrial pressure of 3 mmHg  4. No significant change compared to previous study on 09/21/2020. FINDINGS  Left Ventricle: Left ventricular ejection fraction, by estimation, is 20 to 25%. The left ventricle has severely decreased function. The left ventricle demonstrates global hypokinesis. Pericardium: No significant pericardial effusion Moderate left sided pleural effusion. Venous: The inferior vena cava is normal in size with greater than 50% respiratory variability, suggesting right atrial pressure of 3 mmHg. Additional Comments: There is no pleural effusion in the left lateral region. LEFT VENTRICLE PLAX 2D LVIDd:         5.00 cm LVIDs:         4.40 cm LV PW:         0.90 cm LV IVS:        0.90 cm LVOT diam:     2.40 cm LVOT Area:     4.52 cm  LEFT ATRIUM         Index LA diam:    4.90 cm 2.28 cm/m   AORTA Ao Root diam: 3.50 cm  SHUNTS Systemic Diam: 2.40 cm Vernell Leep MD Electronically signed by Vernell Leep MD Signature Date/Time: 09/24/2020/1:21:57 PM  Final (Amended)         Scheduled Meds: . allopurinol  100 mg Oral q AM  . apixaban  5 mg Oral BID  . aspirin EC  81 mg Oral Daily  . atorvastatin  10 mg Oral Daily  . bumetanide  1 mg Oral BID  . diltiazem  30 mg Oral Q6H  . doxycycline  100 mg Oral Q12H  . metoprolol tartrate  50 mg Oral BID  . spironolactone  25 mg Oral Daily   Continuous Infusions: . sodium chloride 20 mL/hr at 09/25/20 0352  . amiodarone 30 mg/hr (09/25/20 0620)  . cefTRIAXone (ROCEPHIN)  IV 2 g (09/24/20 1430)     LOS: 6 days     Cordelia Poche, MD [facility] 09/25/2020, 11:58 AM  If 7PM-7AM, please contact night-coverage [email or web address]

## 2020-09-25 NOTE — Discharge Instructions (Signed)
Information on my medicine - ELIQUIS (apixaban)  Why was Eliquis prescribed for you? Eliquis was prescribed to treat blood clots that may have been found in the veins in your lungs (pulmonary embolism) and to reduce the risk of them occurring again.  And also to  reduce the risk of a blood clots forming that can cause a stroke, if you have a medical condition called atrial fibrillation (a type of irregular heartbeat).  What do You need to know about Eliquis ?  Your current dose is ONE 5 mg tablet taken TWICE daily.  Eliquis may be taken with or without food.   Try to take the dose about the same time in the morning and in the evening. If you have difficulty swallowing the tablet whole please discuss with your pharmacist how to take the medication safely.  Take Eliquis exactly as prescribed and DO NOT stop taking Eliquis without talking to the doctor who prescribed the medication.  Stopping may increase your risk of developing a new blood clot or developing a stroke.  Refill your prescription before you run out.  After discharge, you should have regular check-up appointments with your healthcare provider that is prescribing your Eliquis.    What do you do if you miss a dose? If a dose of ELIQUIS is not taken at the scheduled time, take it as soon as possible on the same day and twice-daily administration should be resumed. The dose should not be doubled to make up for a missed dose.  Important Safety Information A possible side effect of Eliquis is bleeding. You should call your healthcare provider right away if you experience any of the following: ? Bleeding from an injury or your nose that does not stop. ? Unusual colored urine (red or dark brown) or unusual colored stools (red or black). ? Unusual bruising for unknown reasons. ? A serious fall or if you hit your head (even if there is no bleeding).  Some medicines may interact with Eliquis and might increase your risk of bleeding  or clotting while on Eliquis. To help avoid this, consult your healthcare provider or pharmacist prior to using any new prescription or non-prescription medications, including herbals, vitamins, non-steroidal anti-inflammatory drugs (NSAIDs) and supplements.  This website has more information on Eliquis (apixaban): http://www.eliquis.com/eliquis/home

## 2020-09-25 NOTE — Progress Notes (Addendum)
Progress Note  Patient Name: Carl [name] Date of Encounter: 09/25/2020  Attending physician: Narda Bonds, MD Primary care provider: Roger Kill, PA-C Primary Cardiologist: Dr. Yates Decamp Consultant:Marquay Kruse Jacinto Halim, MD  Subjective: Carl [name] is a 64 y.o. male with paroxysmal typical atrial flutter with RVR, history of pulmonary embolism when he presented to the ED on 08/27/2020, has had cardioversion but reverted back to atrial flutter, readmitted to the hospital with acute diastolic heart failure, recurrence of atrial flutter with RVR and bilateral pneumonia and pleural effusion.  Underwent left thoracentesis with aspiration of 1.5 L of fluid.  Patient was back in atrial fibrillation with RVR, was placed back on IV amiodarone 2 nights ago.  He was scheduled for TEE this afternoon along with cardioversion, fortunately converted to sinus rhythm.  Still on IV amiodarone.  States that he feels the best he has in a while, denies dizziness, dyspnea, cough or leg edema.  Wants to go home.   Objective: Vital Signs in the last 24 hours: Temp:  [98.2 F (36.8 C)-98.8 F (37.1 C)] 98.8 F (37.1 C) (11/16 1125) Pulse Rate:  [69-140] 69 (11/16 1125) Resp:  [17-22] 18 (11/16 1125) BP: (88-105)/(61-75) 88/61 (11/16 1125) SpO2:  [89 %-93 %] 93 % (11/16 1125) Weight:  [98.4 kg] 98.4 kg (11/16 0400)  Intake/Output:  Intake/Output Summary (Last 24 hours) at 09/25/2020 1219 Last data filed at 09/25/2020 0540 Gross per 24 hour  Intake 1005.7 ml  Output 725 ml  Net 280.7 ml    Net IO Since Admission: -14,224.57 mL [09/25/20 1219]  Weights:  Filed Weights   09/23/20 0403 09/24/20 0100 09/25/20 0400  Weight: 98.9 kg 97.6 kg 98.4 kg   PHYSICAL EXAM: Vitals with BMI 09/25/2020 09/25/2020 09/25/2020  Height - - -  Weight - - 216 lbs 14 oz  BMI - - 31.12  Systolic 88 91 -  Diastolic 61 64 -  Pulse 69 72 -   Physical Exam Constitutional:      Appearance: He is obese.    Cardiovascular:     Rate and Rhythm: Normal rate and regular rhythm.     Pulses: Normal pulses and intact distal pulses.     Heart sounds: Normal heart sounds. No murmur heard.  No gallop.      Comments: No pedal edema, no JVD. Pulmonary:     Effort: Pulmonary effort is normal.     Breath sounds: Normal breath sounds.  Abdominal:     General: Bowel sounds are normal.     Palpations: Abdomen is soft.     Lab Results: Hematology Recent Labs  Lab 09/19/20 1133 09/20/20 0400 09/23/20 0138  WBC 17.0* 12.6* 10.7*  RBC 3.88* 3.77* 3.97*  HGB 12.4* 12.3* 12.6*  HCT 39.8 39.5 38.3*  MCV 102.6* 104.8* 96.5  MCH 32.0 32.6 31.7  MCHC 31.2 31.1 32.9  RDW 12.9 12.8 12.4  PLT 366 333 369    Chemistry Recent Labs  Lab 09/19/20 1133 09/20/20 0500 09/23/20 0138 09/24/20 0452 09/25/20 0506  NA 139   < > 137 134* 136  K 4.2   < > 2.9* 3.1* 4.0  CL 104   < > 88* 91* 95*  CO2 25   < > 37* 34* 32  GLUCOSE 112*   < > 88 117* 116*  BUN 10   < > 10 16 14   CREATININE 0.82   < > 0.98 0.97 0.94  CALCIUM 8.6*   < > 9.5 9.0 9.0  PROT  6.0*  --   --   --   --   ALBUMIN 2.3*  --   --   --   --   AST 15  --   --   --   --   ALT 27  --   --   --   --   ALKPHOS 92  --   --   --   --   BILITOT 0.7  --   --   --   --   GFRNONAA >60   < > >60 >60 >60  ANIONGAP 10   < > 12 9 9    < > = values in this interval not displayed.     Cardiac Enzymes: Cardiac Panel (last 3 results) No results for input(s): CKTOTAL, CKMB, TROPONINIHS, RELINDX in the last 72 hours.  BNP (last 3 results) Recent Labs    09/19/20 1135 09/21/20 0441 09/25/20 0506  BNP 149.7* 86.4 235.7*    ProBNP (last 3 results) No results for input(s): PROBNP in the last 8760 hours.   DDimer No results for input(s): DDIMER in the last 168 hours.   Hemoglobin A1c: No results found for: HGBA1C, MPG  TSH  Recent Labs    08/27/20 1430 09/21/20 0441  TSH 0.457 2.577    Lipid Panel No results found for: CHOL, TRIG,  HDL, CHOLHDL, VLDL, LDLCALC, LDLDIRECT  RADIOLOGY:  CT Angio Chest PE W and/or Wo Contrast. Date: 08/27/2020 CLINICAL DATA: Shortness of breath and chest pain  FINDINGS: Cardiovascular: Satisfactory opacification of the pulmonary arteries to the segmental level. There are filling defects in segmental branches of bilateral upper and right middle lobes. Additional suspected filling defects within subsegmental branches. Left atrial enlargement. No evidence of right heart strain. No pericardial effusion. Mediastinum/Nodes: There are no enlarged lymph nodes identified. Thyroid and esophagus are unremarkable. Lungs/Pleura: Patchy areas of atelectasis. No pleural effusion or pneumothorax. Upper Abdomen: No acute abnormality. Musculoskeletal: No acute osseous abnormality. Review of the MIP images confirms the above findings.  IMPRESSION: Acute segmental and subsegmental pulmonary emboli bilaterally. No evidence of right heart strain. Patchy bilateral atelectasis.  DG Chest 1 View  09/22/2020 CLINICAL DATA:  Left-sided thoracentesis today EXAM: CHEST  1 VIEW COMPARISON:  09/20/2020 FINDINGS: Marked reduction in size of the left pleural effusion. No significant pneumothorax. Mild atelectasis and potentially a small amount of residual effusion at the left lung base. Thoracic spondylosis. Borderline enlargement of the cardiopericardial silhouette  CT scan chest without contrast 09/23/2020: 1. Moderate size pericardial effusion has developed since the previous study. 2. Trace amount of right pleural fluid layering dependently with minimal dependent atelectasis. Small left effusion layering dependently with mild dependent atelectasis. 3. Small pulmonary emboli seen previously cannot be visualized in the absence of contrast. 4. Aortic atherosclerosis.  Coronary artery calcification.  Cardiac database: EKG: 09/19/2020: Atrial flutter, 158 bpm, left axis deviation, poor R wave progression, without  underlying injury pattern.  Telemetry: Personally reviewed.  Patient converted to sinus rhythm  yesterday.  Occasional PACs noted.  Echocardiogram: 08/28/2020:  1. Left ventricular ejection fraction, by estimation, is 50 to 55%. The left ventricle has low normal function. The left ventricle has no regional wall motion abnormalities. Left ventricular diastolic parameters were normal. 2. Right ventricular systolic function is normal. The right ventricular size is normal. There is moderately elevated pulmonary artery systolic pressure. The estimated right ventricular systolic pressure is 40.6 mmHg. 3. The mitral valve is normal in structure. Trivial mitral valve regurgitation. No evidence  of mitral stenosis. 4. The aortic valve is normal in structure. Aortic valve regurgitation is not visualized. 5. The inferior vena cava is dilated in size with <50% respiratory variability, suggesting right atrial pressure of 15 mmHg.  Echocardiogram 09/24/2020:  1. Left ventricular ejection fraction, by estimation, is 20 to 25%. The left ventricle has severely decreased function. The left ventricle demonstrates global hypokinesis.  2. No significant pericardial effusion     Moderate left sided pleural effusion.  3. The inferior vena cava is normal in size with greater than 50% respiratory variability, suggesting right atrial pressure of 3 mmHg  4. No significant change compared to previous study on 09/21/2020.  Scheduled Meds: . allopurinol  100 mg Oral q AM  . apixaban  5 mg Oral BID  . aspirin EC  81 mg Oral Daily  . atorvastatin  10 mg Oral Daily  . bumetanide  1 mg Oral BID  . diltiazem  30 mg Oral Q6H  . doxycycline  100 mg Oral Q12H  . metoprolol tartrate  50 mg Oral BID  . spironolactone  25 mg Oral Daily    Continuous Infusions: . sodium chloride 20 mL/hr at 09/25/20 0352  . amiodarone 30 mg/hr (09/25/20 0620)  . cefTRIAXone (ROCEPHIN)  IV 2 g (09/24/20 1430)   PRN  Meds: acetaminophen, levalbuterol, oxyCODONE-acetaminophen, polyethylene glycol, polyvinyl alcohol   IMPRESSION & RECOMMENDATIONS: Kosta Schnitzler is a 64 y.o. male whose past medical history and cardiac risk factors include: Pulmonary embolism, atrial flutter/fibrillation, dyslipidemia, hypertension, premature CAD in the family, smokes cigars, obesity due to excess calories.  1.  Atrial fibrillation with rapid ventricular response, paroxysmal. A. FL typical now in AF. Converted to sinus 09/24/2020:  CHA2DS2-VASc Score is 3.  Yearly risk of stroke: 3.8% (HTN, Vasc Dz, CHF).  Score of 1=0.6; 2=2.2; 3=3.2; 4=4.8; 5=7.2; 6=9.8; 7=>9.8) -(CHF; HTN; vasc disease DM,  Male = 1; Age <65 =0; 65-74 = 1,  >75 =2; stroke/embolism= 2).    2. Acute systolic heart failure, Most likely exacerbated by his underlying symptomatic atrial flutter/A. Fib with RVR. New LV dysfunction. 3.  Tobacco use disorder 4.  Moderate obesity 5.  Excess alcohol use, patient endorses that he was drinking excessively for the past 5 years and only quit upon his first admission to the hospital on 08/27/2020. 6.  Moderate pericardial effusion by CT scan however echocardiogram done yesterday reveals very minimal if any pericardial effusion.  But there is a large left pleural effusion.  Recommendation:  Blood pressure is soft, will reduce metoprolol to 25 mg p.o. twice daily, continue spironolactone 25 mg daily for diuresis, will switch to amiodarone 200 mg p.o. twice daily.  He can be discharged home on the above medication along with Eliquis 5 mg p.o. twice daily and I would like to follow him up in the office in 10 days for management of diastolic heart failure and paroxysmal atrial fibrillation and atrial flutter.  Hopefully he will maintain sinus rhythm by tomorrow.  He will need either cardiac catheterization in view of severe coronary calcification of the coronary arteries on CT scan and LV dysfunction that is new on the  echocardiogram noted during hospitalization.   Yates Decamp, MD, [facility] 09/25/2020, 12:19 PM Office: [phone] Pager: [phone]

## 2020-09-25 NOTE — H&P (View-Only) (Signed)
Progress Note  Patient Name: Carl [name] Date of Encounter: 09/25/2020  Attending physician: Narda Bonds, MD Primary care provider: Roger Kill, PA-C Primary Cardiologist: Dr. Yates Decamp Consultant:Fallon Haecker Jacinto Halim, MD  Subjective: Carl [name] is a 64 y.o. male with paroxysmal typical atrial flutter with RVR, history of pulmonary embolism when he presented to the ED on 08/27/2020, has had cardioversion but reverted back to atrial flutter, readmitted to the hospital with acute diastolic heart failure, recurrence of atrial flutter with RVR and bilateral pneumonia and pleural effusion.  Underwent left thoracentesis with aspiration of 1.5 L of fluid.  Patient was back in atrial fibrillation with RVR, was placed back on IV amiodarone 2 nights ago.  He was scheduled for TEE this afternoon along with cardioversion, fortunately converted to sinus rhythm.  Still on IV amiodarone.  States that he feels the best he has in a while, denies dizziness, dyspnea, cough or leg edema.  Wants to go home.   Objective: Vital Signs in the last 24 hours: Temp:  [98.2 F (36.8 C)-98.8 F (37.1 C)] 98.8 F (37.1 C) (11/16 1125) Pulse Rate:  [69-140] 69 (11/16 1125) Resp:  [17-22] 18 (11/16 1125) BP: (88-105)/(61-75) 88/61 (11/16 1125) SpO2:  [89 %-93 %] 93 % (11/16 1125) Weight:  [98.4 kg] 98.4 kg (11/16 0400)  Intake/Output:  Intake/Output Summary (Last 24 hours) at 09/25/2020 1219 Last data filed at 09/25/2020 0540 Gross per 24 hour  Intake 1005.7 ml  Output 725 ml  Net 280.7 ml    Net IO Since Admission: -14,224.57 mL [09/25/20 1219]  Weights:  Filed Weights   09/23/20 0403 09/24/20 0100 09/25/20 0400  Weight: 98.9 kg 97.6 kg 98.4 kg   PHYSICAL EXAM: Vitals with BMI 09/25/2020 09/25/2020 09/25/2020  Height - - -  Weight - - 216 lbs 14 oz  BMI - - 31.12  Systolic 88 91 -  Diastolic 61 64 -  Pulse 69 72 -   Physical Exam Constitutional:      Appearance: He is obese.    Cardiovascular:     Rate and Rhythm: Normal rate and regular rhythm.     Pulses: Normal pulses and intact distal pulses.     Heart sounds: Normal heart sounds. No murmur heard.  No gallop.      Comments: No pedal edema, no JVD. Pulmonary:     Effort: Pulmonary effort is normal.     Breath sounds: Normal breath sounds.  Abdominal:     General: Bowel sounds are normal.     Palpations: Abdomen is soft.     Lab Results: Hematology Recent Labs  Lab 09/19/20 1133 09/20/20 0400 09/23/20 0138  WBC 17.0* 12.6* 10.7*  RBC 3.88* 3.77* 3.97*  HGB 12.4* 12.3* 12.6*  HCT 39.8 39.5 38.3*  MCV 102.6* 104.8* 96.5  MCH 32.0 32.6 31.7  MCHC 31.2 31.1 32.9  RDW 12.9 12.8 12.4  PLT 366 333 369    Chemistry Recent Labs  Lab 09/19/20 1133 09/20/20 0500 09/23/20 0138 09/24/20 0452 09/25/20 0506  NA 139   < > 137 134* 136  K 4.2   < > 2.9* 3.1* 4.0  CL 104   < > 88* 91* 95*  CO2 25   < > 37* 34* 32  GLUCOSE 112*   < > 88 117* 116*  BUN 10   < > 10 16 14   CREATININE 0.82   < > 0.98 0.97 0.94  CALCIUM 8.6*   < > 9.5 9.0 9.0  PROT  6.0*  --   --   --   --   ALBUMIN 2.3*  --   --   --   --   AST 15  --   --   --   --   ALT 27  --   --   --   --   ALKPHOS 92  --   --   --   --   BILITOT 0.7  --   --   --   --   GFRNONAA >60   < > >60 >60 >60  ANIONGAP 10   < > 12 9 9    < > = values in this interval not displayed.     Cardiac Enzymes: Cardiac Panel (last 3 results) No results for input(s): CKTOTAL, CKMB, TROPONINIHS, RELINDX in the last 72 hours.  BNP (last 3 results) Recent Labs    09/19/20 1135 09/21/20 0441 09/25/20 0506  BNP 149.7* 86.4 235.7*    ProBNP (last 3 results) No results for input(s): PROBNP in the last 8760 hours.   DDimer No results for input(s): DDIMER in the last 168 hours.   Hemoglobin A1c: No results found for: HGBA1C, MPG  TSH  Recent Labs    08/27/20 1430 09/21/20 0441  TSH 0.457 2.577    Lipid Panel No results found for: CHOL, TRIG,  HDL, CHOLHDL, VLDL, LDLCALC, LDLDIRECT  RADIOLOGY:  CT Angio Chest PE W and/or Wo Contrast. Date: 08/27/2020 CLINICAL DATA: Shortness of breath and chest pain  FINDINGS: Cardiovascular: Satisfactory opacification of the pulmonary arteries to the segmental level. There are filling defects in segmental branches of bilateral upper and right middle lobes. Additional suspected filling defects within subsegmental branches. Left atrial enlargement. No evidence of right heart strain. No pericardial effusion. Mediastinum/Nodes: There are no enlarged lymph nodes identified. Thyroid and esophagus are unremarkable. Lungs/Pleura: Patchy areas of atelectasis. No pleural effusion or pneumothorax. Upper Abdomen: No acute abnormality. Musculoskeletal: No acute osseous abnormality. Review of the MIP images confirms the above findings.  IMPRESSION: Acute segmental and subsegmental pulmonary emboli bilaterally. No evidence of right heart strain. Patchy bilateral atelectasis.  DG Chest 1 View  09/22/2020 CLINICAL DATA:  Left-sided thoracentesis today EXAM: CHEST  1 VIEW COMPARISON:  09/20/2020 FINDINGS: Marked reduction in size of the left pleural effusion. No significant pneumothorax. Mild atelectasis and potentially a small amount of residual effusion at the left lung base. Thoracic spondylosis. Borderline enlargement of the cardiopericardial silhouette  CT scan chest without contrast 09/23/2020: 1. Moderate size pericardial effusion has developed since the previous study. 2. Trace amount of right pleural fluid layering dependently with minimal dependent atelectasis. Small left effusion layering dependently with mild dependent atelectasis. 3. Small pulmonary emboli seen previously cannot be visualized in the absence of contrast. 4. Aortic atherosclerosis.  Coronary artery calcification.  Cardiac database: EKG: 09/19/2020: Atrial flutter, 158 bpm, left axis deviation, poor R wave progression, without  underlying injury pattern.  Telemetry: Personally reviewed.  Patient converted to sinus rhythm  yesterday.  Occasional PACs noted.  Echocardiogram: 08/28/2020:  1. Left ventricular ejection fraction, by estimation, is 50 to 55%. The left ventricle has low normal function. The left ventricle has no regional wall motion abnormalities. Left ventricular diastolic parameters were normal. 2. Right ventricular systolic function is normal. The right ventricular size is normal. There is moderately elevated pulmonary artery systolic pressure. The estimated right ventricular systolic pressure is 40.6 mmHg. 3. The mitral valve is normal in structure. Trivial mitral valve regurgitation. No evidence  of mitral stenosis. 4. The aortic valve is normal in structure. Aortic valve regurgitation is not visualized. 5. The inferior vena cava is dilated in size with <50% respiratory variability, suggesting right atrial pressure of 15 mmHg.  Echocardiogram 09/24/2020:  1. Left ventricular ejection fraction, by estimation, is 20 to 25%. The left ventricle has severely decreased function. The left ventricle demonstrates global hypokinesis.  2. No significant pericardial effusion     Moderate left sided pleural effusion.  3. The inferior vena cava is normal in size with greater than 50% respiratory variability, suggesting right atrial pressure of 3 mmHg  4. No significant change compared to previous study on 09/21/2020.  Scheduled Meds: . allopurinol  100 mg Oral q AM  . apixaban  5 mg Oral BID  . aspirin EC  81 mg Oral Daily  . atorvastatin  10 mg Oral Daily  . bumetanide  1 mg Oral BID  . diltiazem  30 mg Oral Q6H  . doxycycline  100 mg Oral Q12H  . metoprolol tartrate  50 mg Oral BID  . spironolactone  25 mg Oral Daily    Continuous Infusions: . sodium chloride 20 mL/hr at 09/25/20 0352  . amiodarone 30 mg/hr (09/25/20 0620)  . cefTRIAXone (ROCEPHIN)  IV 2 g (09/24/20 1430)   PRN  Meds: acetaminophen, levalbuterol, oxyCODONE-acetaminophen, polyethylene glycol, polyvinyl alcohol   IMPRESSION & RECOMMENDATIONS: Carl [name] is a 64 y.o. male whose past medical history and cardiac risk factors include: Pulmonary embolism, atrial flutter/fibrillation, dyslipidemia, hypertension, premature CAD in the family, smokes cigars, obesity due to excess calories.  1.  Atrial fibrillation with rapid ventricular response, paroxysmal. A. FL typical now in AF. Converted to sinus 09/24/2020:  CHA2DS2-VASc Score is 3.  Yearly risk of stroke: 3.8% (HTN, Vasc Dz, CHF).  Score of 1=0.6; 2=2.2; 3=3.2; 4=4.8; 5=7.2; 6=9.8; 7=>9.8) -(CHF; HTN; vasc disease DM,  Male = 1; Age <65 =0; 65-74 = 1,  >75 =2; stroke/embolism= 2).    2. Acute systolic heart failure, Most likely exacerbated by his underlying symptomatic atrial flutter/A. Fib with RVR. New LV dysfunction. 3.  Tobacco use disorder 4.  Moderate obesity 5.  Excess alcohol use, patient endorses that he was drinking excessively for the past 5 years and only quit upon his first admission to the hospital on 08/27/2020. 6.  Moderate pericardial effusion by CT scan however echocardiogram done yesterday reveals very minimal if any pericardial effusion.  But there is a large left pleural effusion.  Recommendation:  Blood pressure is soft, will reduce metoprolol to 25 mg p.o. twice daily, continue spironolactone 25 mg daily for diuresis, will switch to amiodarone 200 mg p.o. twice daily.  He can be discharged home on the above medication along with Eliquis 5 mg p.o. twice daily and I would like to follow him up in the office in 10 days for management of diastolic heart failure and paroxysmal atrial fibrillation and atrial flutter.  Hopefully he will maintain sinus rhythm by tomorrow.  He will need either cardiac catheterization in view of severe coronary calcification of the coronary arteries on CT scan and LV dysfunction that is new on the  echocardiogram noted during hospitalization.   Yates Decamp, MD, [facility] 09/25/2020, 12:19 PM Office: [phone] Pager: [phone]

## 2020-09-25 NOTE — Plan of Care (Signed)
°  Problem: Cardiac: Goal: Ability to achieve and maintain adequate cardiopulmonary perfusion will improve 09/25/2020 2204 by Carolanne Grumbling, RN Outcome: Progressing 09/25/2020 2204 by Carolanne Grumbling, RN Outcome: Progressing

## 2020-09-25 NOTE — Interval H&P Note (Signed)
History and Physical Interval Note:  09/25/2020 2:27 PM  Carl [name]  has presented today for surgery, with the diagnosis of heart failure.  The various methods of treatment have been discussed with the patient and family. After consideration of risks, benefits and other options for treatment, the patient has consented to  Procedure(s): LEFT HEART CATH AND CORONARY ANGIOGRAPHY (N/A) as a surgical intervention.  The patient's history has been reviewed, patient examined, no change in status, stable for surgery.  I have reviewed the patient's chart and labs.  Questions were answered to the patient's satisfaction.    2012 Appropriate Use Criteria for Diagnostic Catheterization Home / Select Test of Interest Desired Test and Scenario Evaluation for ischemic etiology for known heart failure without established etiology Link Here: [email or web address] Indication:  Angina/ischemic equivalent syndrome A (Appropriate); HF Indication: 6   Rafal Archuleta J Decari Duggar

## 2020-09-25 NOTE — Progress Notes (Signed)
Pt in normal sinus rhythm, Dr. Rosemary Holms aware and agreed. Pt's cardioversion cancelled today. Pt's RN in [phone] made aware and is aware to get a 12 lead EKG. Weston Settle, RN

## 2020-09-25 NOTE — Plan of Care (Signed)
  Problem: Education: Goal: Ability to verbalize understanding of medication therapies will improve Outcome: Progressing   

## 2020-09-26 DIAGNOSIS — Z720 Tobacco use: Secondary | ICD-10-CM

## 2020-09-26 DIAGNOSIS — N390 Urinary tract infection, site not specified: Secondary | ICD-10-CM

## 2020-09-26 DIAGNOSIS — F1010 Alcohol abuse, uncomplicated: Secondary | ICD-10-CM

## 2020-09-26 DIAGNOSIS — I4892 Unspecified atrial flutter: Secondary | ICD-10-CM | POA: Diagnosis not present

## 2020-09-26 DIAGNOSIS — J9601 Acute respiratory failure with hypoxia: Secondary | ICD-10-CM

## 2020-09-26 LAB — BASIC METABOLIC PANEL
Anion gap: 5 (ref 5–15)
BUN: 11 mg/dL (ref 8–23)
CO2: 33 mmol/L — ABNORMAL HIGH (ref 22–32)
Calcium: 8.7 mg/dL — ABNORMAL LOW (ref 8.9–10.3)
Chloride: 99 mmol/L (ref 98–111)
Creatinine, Ser: 0.88 mg/dL (ref 0.61–1.24)
GFR, Estimated: >60 mL/min (ref >60)
Glucose, Bld: 89 mg/dL (ref 70–99)
Potassium: 3.9 mmol/L (ref 3.5–5.1)
Sodium: 137 mmol/L (ref 135–145)

## 2020-09-26 LAB — CBC
HCT: 40.5 % (ref 39.0–52.0)
Hemoglobin: 12.8 g/dL — ABNORMAL LOW (ref 13.0–17.0)
MCH: 31.1 pg (ref 26.0–34.0)
MCHC: 31.6 g/dL (ref 30.0–36.0)
MCV: 98.5 fL (ref 80.0–100.0)
Platelets: 349 10*3/uL (ref 150–400)
RBC: 4.11 MIL/uL — ABNORMAL LOW (ref 4.22–5.81)
RDW: 12.7 % (ref 11.5–15.5)
WBC: 11.1 10*3/uL — ABNORMAL HIGH (ref 4.0–10.5)
nRBC: 0 % (ref 0.0–0.2)

## 2020-09-26 MED ORDER — APIXABAN 5 MG PO TABS: 5.0000 mg | ORAL_TABLET | Freq: Two times a day (BID) | ORAL | Status: DC

## 2020-09-26 MED ORDER — METOPROLOL SUCCINATE ER 50 MG PO TB24
50.0000 mg | ORAL_TABLET | Freq: Every day | ORAL | 1 refills | Status: DC
Start: 2020-09-27 — End: 2020-11-27

## 2020-09-26 MED ORDER — ASPIRIN 81 MG PO TBEC
81.0000 mg | DELAYED_RELEASE_TABLET | Freq: Every day | ORAL | 11 refills | Status: AC
Start: 2020-09-27 — End: ?

## 2020-09-26 MED ORDER — BUMETANIDE 1 MG PO TABS
1.0000 mg | ORAL_TABLET | Freq: Two times a day (BID) | ORAL | 1 refills | Status: DC
Start: 2020-09-26 — End: 2020-09-26

## 2020-09-26 MED ORDER — SPIRONOLACTONE 25 MG PO TABS
25.0000 mg | ORAL_TABLET | Freq: Every day | ORAL | 1 refills | Status: DC
Start: 2020-09-27 — End: 2020-11-27

## 2020-09-26 NOTE — Plan of Care (Signed)

## 2020-09-26 NOTE — Discharge Summary (Signed)
Physician Discharge Summary  Jeffery Bachmeier ZOX:[id number] DOB: 02-22-1956 DOA: 09/19/2020  PCP: Roger Kill, PA-C  Admit date: 09/19/2020 Discharge date: 09/26/2020  Admitted From: Home Disposition:  Home  Recommendations for Outpatient Follow-up:  1. Follow up with PCP in 1 week 2. Follow up with Cardiology/EP as scheduled   Discharge Condition: Stable CODE STATUS: Full  Diet recommendation: Heart healthy   Brief/Interim Summary: From H&P by Dr. Ella Jubilee: "HPI: Breydan Shillingburg is a 64 y.o. male with medical history significant of atrial fibrillation, dyslipidemia, hypertension, obesity, pulmonary embolism home presented with palpitations and dyspnea.  He was recently hospitalized 08/27/2020 to 08/30/2020, diagnosed with acute bilateral pulmonary embolism complicated with atrial fibrillation with rapid ventricular response. Patient was discharged on metoprolol, Multaq and diltiazem.  When he reached his home he was able to do his activities of daily living, including walking around his block.  Over the subsequent days he developed progressive and worsening dyspnea on exertion, associated with PND, orthopnea and lower extremity edema.  He had intermittent palpitations with a heart rate at home between 140 and 160.  On October 27 he went to his primary care provider, with a complaint of cough and chills, he tested negative for COVID-19, and he was started on antibiotic therapy with Augmentin and doxycycline for presumed pneumonia.  November 2 he had a post hospitalization follow-up with his primary care provider, he was noted to have lower extremity edema and persistent dyspnea.  Reported to be noncompliant with his medications including apixaban, metoprolol, Cardizem and Multaq.  He was started on furosemide, 20 mg daily, and he was advised to resume apixaban, metoprolol and Cardizem. In regards of Multaq, since he reported side effects (not feeling himself and not able to sleep)  it was recommended to continue to hold.  Referred for follow-up with cardiology.   November 8 he had a follow-up with cardiology, he was found in atrial fibrillation with 2- 1 conduction, he was advised to resume Multaq.  He was given the option of  hospital admission or close follow-up on Multaq, he preferred the latter option and went home.    Over the last 24 hours her dyspnea has been severe in intensity, to the point where he is dyspneic with minimal efforts, no improving factors, worse with movement, associated with a total of 15 pound weight gain over the last [redacted] weeks along of lower extremity edema, PND and orthopnea.  ED Course: Patient was noted on atrial fibrillation with rapid ventricular response, was placed on amiodarone and diltiazem infusion per cardiology recommendations.  Anticoagulation with IV heparin.  For presumptive pneumonia he received azithromycin and ceftriaxone.  Referred for further inpatient management."  Patient underwent left thoracentesis with aspiration of 1.5 L of fluid.  Due to A. fib RVR, he was placed back on IV amiodarone but failed to maintain sinus rhythm, amiodarone stopped.  He underwent cardiac cath 11/16 for evaluation of ischemic etiology of atrial fibrillation/flutter.  It did not reveal significant coronary artery disease.  Echocardiogram revealed reduced EF 20 to 25%, Cardizem stopped.  Patient maintained on normal sinus rhythm with tachycardia of 100 at rest.  Cardiology recommended patient follow-up outpatient with EP.  Patient was also treated with antibiotics for sepsis secondary to community-acquired pneumonia as well as urinary tract infection and completed antibiotic therapy.  On day of discharge, he remained asymptomatic, on room air, feeling well and ready to be discharged home.  Discharge Diagnoses:  Principal Problem:   Paroxysmal atrial flutter (HCC) Active  Problems:   Primary hypertension   Typical atrial flutter (HCC)   Acute pulmonary  embolism (HCC)   Dyslipidemia   Class 1 obesity   Acute systolic CHF (congestive heart failure) (HCC)   Gout   Pleural effusion on left   Unspecified atrial fibrillation (HCC)   Community acquired pneumonia   Dyspnea   Long term current use of antiarrhythmic drug   Long term (current) use of anticoagulants   Pericardial effusion   Hypokalemia   Tobacco abuse   Alcohol abuse   Acute hypoxemic respiratory failure (HCC)   Acute lower UTI Sepsis ruled in, POA    Discharge Instructions  Discharge Instructions    (HEART FAILURE PATIENTS) Call MD:  Anytime you have any of the following symptoms: 1) 3 pound weight gain in 24 hours or 5 pounds in 1 week 2) shortness of breath, with or without a dry hacking cough 3) swelling in the hands, feet or stomach 4) if you have to sleep on extra pillows at night in order to breathe.   Complete by: As directed    Call MD for:  difficulty breathing, headache or visual disturbances   Complete by: As directed    Call MD for:  extreme fatigue   Complete by: As directed    Call MD for:  persistant dizziness or light-headedness   Complete by: As directed    Call MD for:  persistant nausea and vomiting   Complete by: As directed    Call MD for:  severe uncontrolled pain   Complete by: As directed    Call MD for:  temperature >100.4   Complete by: As directed    Discharge instructions   Complete by: As directed    You were cared for by a hospitalist during your hospital stay. If you have any questions about your discharge medications or the care you received while you were in the hospital after you are discharged, you can call the unit and ask to speak with the hospitalist on call if the hospitalist that took care of you is not available. Once you are discharged, your primary care physician will handle any further medical issues. Please note that NO REFILLS for any discharge medications will be authorized once you are discharged, as it is imperative that  you return to your primary care physician (or establish a relationship with a primary care physician if you do not have one) for your aftercare needs so that they can reassess your need for medications and monitor your lab values.   Increase activity slowly   Complete by: As directed      Allergies as of 09/26/2020   No Known Allergies     Medication List    STOP taking these medications   amoxicillin-clavulanate 875-125 MG tablet Commonly known as: AUGMENTIN   diltiazem 300 MG 24 hr capsule Commonly known as: CARDIZEM CD   doxycycline 100 MG capsule Commonly known as: MONODOX   dronedarone 400 MG tablet Commonly known as: MULTAQ   furosemide 20 MG tablet Commonly known as: LASIX   ibuprofen 200 MG tablet Commonly known as: ADVIL   metoprolol tartrate 50 MG tablet Commonly known as: LOPRESSOR     TAKE these medications   allopurinol 100 MG tablet Commonly known as: ZYLOPRIM Take 100 mg by mouth in the morning.   apixaban 5 MG Tabs tablet Commonly known as: ELIQUIS Take 1 tablet (5 mg total) by mouth 2 (two) times daily.   aspirin 81 MG EC  tablet Take 1 tablet (81 mg total) by mouth daily. Swallow whole. Start taking on: September 27, 2020   atorvastatin 10 MG tablet Commonly known as: LIPITOR Take 10 mg by mouth daily.   levalbuterol 45 MCG/ACT inhaler Commonly known as: XOPENEX HFA Inhale 1 puff into the lungs every 4 (four) hours as needed for wheezing.   metoprolol succinate 50 MG 24 hr tablet Commonly known as: TOPROL-XL Take 1 tablet (50 mg total) by mouth daily. Take with or immediately following a meal. Start taking on: September 27, 2020   RA Probiotic Digestive Care Caps Take 1 capsule by mouth in the morning.   spironolactone 25 MG tablet Commonly known as: ALDACTONE Take 1 tablet (25 mg total) by mouth daily. Start taking on: September 27, 2020   tadalafil 20 MG tablet Commonly known as: CIALIS Take 20 mg by mouth daily as needed for  erectile dysfunction.       Follow-up Information    Rayford Halsted, PA-C Follow up on 10/08/2020.   Specialty: Cardiology Why: 3 PM appointment and please bring all medications Contact information: [address] [place] [address] [phone]        Roger Kill, PA-C. Schedule an appointment as soon as possible for a visit in 1 week(s).   Specialty: Physician Assistant Contact information: [address] [place] [address] [phone]              No Known Allergies  Consultations:  Cardiology   Procedures/Studies: DG Chest 1 View  Result Date: 09/22/2020 CLINICAL DATA:  Left-sided thoracentesis today EXAM: CHEST  1 VIEW COMPARISON:  09/20/2020 FINDINGS: Marked reduction in size of the left pleural effusion. No significant pneumothorax. Mild atelectasis and potentially a small amount of residual effusion at the left lung base. Thoracic spondylosis. Borderline enlargement of the cardiopericardial silhouette. IMPRESSION: 1. Marked reduction in size of the left pleural effusion status post thoracentesis. No pneumothorax. 2. Borderline enlargement of the cardiopericardial silhouette. Electronically Signed   By: Gaylyn Rong M.D.   On: 09/22/2020 14:30   CT CHEST WO CONTRAST  Result Date: 09/23/2020 CLINICAL DATA:  Follow-up left-sided thoracentesis. Pleural effusion. Persistent cough. EXAM: CT CHEST WITHOUT CONTRAST TECHNIQUE: Multidetector CT imaging of the chest was performed following the standard protocol without IV contrast. COMPARISON:  08/27/2020 FINDINGS: Cardiovascular: Moderate size pericardial effusion has developed since the previous study. Extensive coronary artery calcification again evident. Aortic atherosclerotic calcification again evident. Small pulmonary emboli seen previously cannot be visualized in the absence of contrast. Mediastinum/Nodes: No mediastinal or hilar mass or lymphadenopathy. Lungs/Pleura: Trace  amount right pleural fluid layering dependently with minimal dependent atelectasis. Small left effusion layering dependently with mild dependent adjacent atelectasis. Non dependent aerated lung appears normal. Upper Abdomen: No acute or significant upper abdominal finding. Musculoskeletal: Ordinary thoracic degenerative changes. IMPRESSION: 1. Moderate size pericardial effusion has developed since the previous study. 2. Trace amount of right pleural fluid layering dependently with minimal dependent atelectasis. Small left effusion layering dependently with mild dependent atelectasis. 3. Small pulmonary emboli seen previously cannot be visualized in the absence of contrast. 4. Aortic atherosclerosis.  Coronary artery calcification. Aortic Atherosclerosis (ICD10-I70.0). Electronically Signed   By: Paulina Fusi M.D.   On: 09/23/2020 13:06   CT Angio Chest PE W and/or Wo Contrast  Result Date: 08/27/2020 CLINICAL DATA:  Shortness of breath and chest pain EXAM: CT ANGIOGRAPHY CHEST WITH CONTRAST TECHNIQUE: Multidetector CT imaging of the chest was performed using  the standard protocol during bolus administration of intravenous contrast. Multiplanar CT image reconstructions and MIPs were obtained to evaluate the vascular anatomy. CONTRAST:  25mL OMNIPAQUE IOHEXOL 350 MG/ML SOLN COMPARISON:  None. FINDINGS: Cardiovascular: Satisfactory opacification of the pulmonary arteries to the segmental level. There are filling defects in segmental branches of bilateral upper and right middle lobes. Additional suspected filling defects within subsegmental branches. Left atrial enlargement. No evidence of right heart strain. No pericardial effusion. Mediastinum/Nodes: There are no enlarged lymph nodes identified. Thyroid and esophagus are unremarkable. Lungs/Pleura: Patchy areas of atelectasis. No pleural effusion or pneumothorax. Upper Abdomen: No acute abnormality. Musculoskeletal: No acute osseous abnormality. Review of the MIP  images confirms the above findings. IMPRESSION: Acute segmental and subsegmental pulmonary emboli bilaterally. No evidence of right heart strain. Patchy bilateral atelectasis. These results were called by telephone at the time of interpretation on 08/27/2020 at 8:52 pm to provider [facility] , who verbally acknowledged these results. Electronically Signed   By: Guadlupe Spanish M.D.   On: 08/27/2020 20:59   CARDIAC CATHETERIZATION  Addendum Date: 09/25/2020   LM: Normal LAD: Type 1 LAD that does not reach apex         Medial calcification without any luminal stenosis         Large Diag 1 that reaches apex and also gives off septal perforators, making it a dual LAD system         No other diagonals present LCx: Normal Ramus: Normal RCA: Large, gives of several septal perforators, reaches apex         Minimal luminal irregularities No significant coronary artery disease I discussed with the patient re: management of paroxysmal Afib, as well prior h/o atrial flutter. Cardioversion unlikely to help, as he has been in paroxysmal Afib. His EF dropped from normal to 25-30% while in Afib, making rhythm control therapy important. I discussed AAD vs ablation. He wants to avoid being on pills and would prefer consultation re: ablation. Will arrange this outpatient. Continue anticoagulation with eliquis and rate control with metoprolol succinate 50 mg daily. Stop amiodarone as it has not been able to maintain sinus rhythm anyway. Stop diltiazem to avoid negative inotropy. From cardiac standpoint, okay to discharge on 11/17, if rate controlled to <110 bpm at rest. Will arrange outpatient f/u and EP referral. Elder Negus, MD Pager: [phone] Office: [phone]   Result Date: 09/25/2020 LM: Normal LAD: Type 1 LAD that does not reach apex         Medial calcification without any luminal stenosis         Large Diag 1 that reaches apex and also gives off septal perforators, making it a dual LAD system          No other diagonals present LCx: Normal Ramus: Normal RCA: Large, gives of several septal perforators, reaches apex         Minimal luminal irregularities No significant coronary artery disease I discussed with the patient re: management of paroxysmal Afib, as well prior h/o atrial flutter. Cardioversion unlikely to help, as he has been in paroxysmal Afib. His EF dropped from normal to 25-30% while in Afib, making rhythm control therapy important. I discussed AAD vs ablation. He wants to avoid being on pills and would prefer consultation re: ablation. Will arrange this outpatient. Continue anticoagulation with eliquis and rate control with metoprolol succinate 50 mg daily. Stop amiodarone as it has not been able to maintain sinus rhythm anyway. Stop diltiazem to avoid negative  inotropy. Elder Negus, MD Pager: [phone] Office: [phone]   DG Chest Port 1 View  Result Date: 09/20/2020 CLINICAL DATA:  Left pleural effusion status post thoracentesis EXAM: PORTABLE CHEST 1 VIEW COMPARISON:  09/19/2020 FINDINGS: Stable cardiomegaly. Slight interval decrease in left-sided pleural effusion, now moderate. Hazy left basilar opacity. Right lung remains clear. No pneumothorax. IMPRESSION: Interval decrease in left-sided pleural effusion, now moderate. No pneumothorax. Electronically Signed   By: Duanne Guess D.O.   On: 09/20/2020 08:29   DG Chest Port 1 View  Result Date: 09/19/2020 CLINICAL DATA:  Sepsis EXAM: PORTABLE CHEST 1 VIEW COMPARISON:  08/27/2020 FINDINGS: Cardiac silhouette appears enlarged, although is partially obscured by a moderate to large left-sided pleural effusion. Associated hazy left basilar opacity. Right lung is clear. No pneumothorax. IMPRESSION: 1. Moderate to large left-sided pleural effusion, new from prior. Associated hazy left basilar opacity may reflect atelectasis or pneumonia. 2. Cardiomegaly. An underlying pericardial effusion would be difficult to exclude.  Electronically Signed   By: Duanne Guess D.O.   On: 09/19/2020 12:18   DG Chest Port 1 View  Result Date: 08/27/2020 CLINICAL DATA:  Chest pain. EXAM: PORTABLE CHEST 1 VIEW COMPARISON:  September 23, 2017. FINDINGS: The heart size and mediastinal contours are within normal limits. No pneumothorax or pleural effusion is noted. Hypoinflation of the lungs is noted with mild bibasilar subsegmental atelectasis. The visualized skeletal structures are unremarkable. IMPRESSION: Hypoinflation of the lungs with mild bibasilar subsegmental atelectasis. Electronically Signed   By: Lupita Raider M.D.   On: 08/27/2020 14:42   ECHOCARDIOGRAM COMPLETE  Result Date: 08/28/2020    ECHOCARDIOGRAM REPORT   Patient Name:   ALASTOR KNEALE Date of Exam: 08/28/2020 Medical Rec #:  [id number]     Height:       70.0 in Accession #:    [id number]    Weight:       225.0 lb Date of Birth:  02/07/1956     BSA:          2.194 m Patient Age:    64 years      BP:           134/89 mmHg Patient Gender: M             HR:           95 bpm. Exam Location:  Inpatient Procedure: 2D Echo, Cardiac Doppler and Color Doppler Indications:     Pulmonary embolus, Atrial flutter  History:         Patient has no prior history of Echocardiogram examinations.                  Arrythmias:Atrial Flutter, Signs/Symptoms:Pulmonary embolus,                  Shortness of Breath and Chest Pain; Risk Factors:Hypertension                  and Dyslipidemia.  Sonographer:     Lavenia Atlas RDCS Referring Phys:  [id number] Lavone Neri OPYD Diagnosing Phys: Yates Decamp MD IMPRESSIONS  1. Left ventricular ejection fraction, by estimation, is 50 to 55%. The left ventricle has low normal function. The left ventricle has no regional wall motion abnormalities. Left ventricular diastolic parameters were normal.  2. Right ventricular systolic function is normal. The right ventricular size is normal. There is moderately elevated pulmonary artery systolic pressure. The  estimated right ventricular systolic pressure is 40.6 mmHg.  3. The mitral valve  is normal in structure. Trivial mitral valve regurgitation. No evidence of mitral stenosis.  4. The aortic valve is normal in structure. Aortic valve regurgitation is not visualized.  5. The inferior vena cava is dilated in size with <50% respiratory variability, suggesting right atrial pressure of 15 mmHg. FINDINGS  Left Ventricle: Left ventricular ejection fraction, by estimation, is 50 to 55%. The left ventricle has low normal function. The left ventricle has no regional wall motion abnormalities. The left ventricular internal cavity size was normal in size. There is no left ventricular hypertrophy. Left ventricular diastolic parameters were normal. Right Ventricle: The right ventricular size is normal. No increase in right ventricular wall thickness. Right ventricular systolic function is normal. There is moderately elevated pulmonary artery systolic pressure. The tricuspid regurgitant velocity is 2.53 m/s, and with an assumed right atrial pressure of 15 mmHg, the estimated right ventricular systolic pressure is 40.6 mmHg. Left Atrium: Left atrial size was normal in size. Right Atrium: Right atrial size was normal in size. Pericardium: There is no evidence of pericardial effusion. Mitral Valve: The mitral valve is normal in structure. Trivial mitral valve regurgitation. No evidence of mitral valve stenosis. Tricuspid Valve: The tricuspid valve is normal in structure. Tricuspid valve regurgitation is mild . No evidence of tricuspid stenosis. Aortic Valve: The aortic valve is normal in structure. Aortic valve regurgitation is not visualized. Pulmonic Valve: The pulmonic valve was grossly normal. Pulmonic valve regurgitation is not visualized. Aorta: The aortic root is normal in size and structure. Pulmonary Artery: The pulmonary artery is of normal size. Venous: The inferior vena cava is dilated in size with less than 50% respiratory  variability, suggesting right atrial pressure of 15 mmHg. IAS/Shunts: No atrial level shunt detected by color flow Doppler.  LEFT VENTRICLE PLAX 2D LVIDd:         6.30 cm  Diastology LVIDs:         4.40 cm  LV e' medial:    6.64 cm/s LV PW:         1.10 cm  LV E/e' medial:  15.2 LV IVS:        1.20 cm  LV e' lateral:   9.90 cm/s LVOT diam:     2.20 cm  LV E/e' lateral: 10.2 LV SV:         68 LV SV Index:   31 LVOT Area:     3.80 cm  RIGHT VENTRICLE RV Basal diam:  2.90 cm RV S prime:     12.80 cm/s TAPSE (M-mode): 3.2 cm RVSP:           40.6 mmHg LEFT ATRIUM             Index       RIGHT ATRIUM            Index LA diam:        4.60 cm 2.10 cm/m  RA Pressure: 15.00 mmHg LA Vol (A2C):   37.8 ml 17.23 ml/m RA Area:     15.10 cm LA Vol (A4C):   41.6 ml 18.96 ml/m RA Volume:   37.20 ml   16.95 ml/m LA Biplane Vol: 40.1 ml 18.27 ml/m  AORTIC VALVE LVOT Vmax:   83.50 cm/s LVOT Vmean:  62.000 cm/s LVOT VTI:    0.179 m  AORTA Ao Root diam: 3.20 cm MITRAL VALVE                TRICUSPID VALVE MV Area (PHT): 7.44 cm  TR Peak grad:   25.6 mmHg MV Decel Time: 102 msec     TR Vmax:        253.00 cm/s MV E velocity: 101.00 cm/s  Estimated RAP:  15.00 mmHg MV A velocity: 51.80 cm/s   RVSP:           40.6 mmHg MV E/A ratio:  1.95                             SHUNTS                             Systemic VTI:  0.18 m                             Systemic Diam: 2.20 cm Yates Decamp MD Electronically signed by Yates Decamp MD Signature Date/Time: 08/28/2020/10:19:06 AM    Final    VAS [place] LOWER EXTREMITY VENOUS (DVT)  Result Date: 08/28/2020  Lower Venous DVTStudy Indications: Pulmonary embolism.  Comparison Study: no prior Performing Technologist: Blanch Media RVS  Examination Guidelines: A complete evaluation includes B-mode imaging, spectral Doppler, color Doppler, and power Doppler as needed of all accessible portions of each vessel. Bilateral testing is considered an integral part of a complete examination. Limited  examinations for reoccurring indications may be performed as noted. The reflux portion of the exam is performed with the patient in reverse Trendelenburg.  +---------+---------------+---------+-----------+----------+--------------+ RIGHT    CompressibilityPhasicitySpontaneityPropertiesThrombus Aging +---------+---------------+---------+-----------+----------+--------------+ CFV      Full           Yes      Yes                                 +---------+---------------+---------+-----------+----------+--------------+ SFJ      Full                                                        +---------+---------------+---------+-----------+----------+--------------+ FV Prox  Full                                                        +---------+---------------+---------+-----------+----------+--------------+ FV Mid   Full                                                        +---------+---------------+---------+-----------+----------+--------------+ FV DistalFull                                                        +---------+---------------+---------+-----------+----------+--------------+ PFV      Full                                                        +---------+---------------+---------+-----------+----------+--------------+  POP      Full           Yes      Yes                                 +---------+---------------+---------+-----------+----------+--------------+ PTV      Full                                                        +---------+---------------+---------+-----------+----------+--------------+ PERO     Full                                                        +---------+---------------+---------+-----------+----------+--------------+   +---------+---------------+---------+-----------+----------+--------------+ LEFT     CompressibilityPhasicitySpontaneityPropertiesThrombus Aging  +---------+---------------+---------+-----------+----------+--------------+ CFV      Full           Yes      Yes                                 +---------+---------------+---------+-----------+----------+--------------+ SFJ      Full                                                        +---------+---------------+---------+-----------+----------+--------------+ FV Prox  Full                                                        +---------+---------------+---------+-----------+----------+--------------+ FV Mid   Full                                                        +---------+---------------+---------+-----------+----------+--------------+ FV DistalFull                                                        +---------+---------------+---------+-----------+----------+--------------+ PFV      Full                                                        +---------+---------------+---------+-----------+----------+--------------+ POP      Full           Yes      Yes                                 +---------+---------------+---------+-----------+----------+--------------+  PTV      Full                                                        +---------+---------------+---------+-----------+----------+--------------+ PERO     Full                                                        +---------+---------------+---------+-----------+----------+--------------+     Summary: BILATERAL: - No evidence of deep vein thrombosis seen in the lower extremities, bilaterally. - No evidence of superficial venous thrombosis in the lower extremities, bilaterally. -   *See table(s) above for measurements and observations. Electronically signed by Gretta Began MD on 08/28/2020 at 4:27:49 PM.    Final    ECHOCARDIOGRAM LIMITED  Result Date: 09/25/2020    ECHOCARDIOGRAM LIMITED REPORT   Patient Name:   SAWYER MENTZER Date of Exam: 09/21/2020 Medical Rec #:  [id number]      Height:       70.0 in Accession #:    [id number]    Weight:       232.3 lb Date of Birth:  1956-10-05     BSA:          2.224 m Patient Age:    64 years      BP:           107/76 mmHg Patient Gender: M             HR:           84 bpm. Exam Location:  Inpatient Procedure: 2D Echo Indications:     atrial flutter. pulmonary embolism.  History:         Patient has prior history of Echocardiogram examinations, most                  recent 08/28/2020. CHF, Arrythmias:Atrial Fibrillation and                  Atrial Flutter; Risk Factors:Dyslipidemia.  Sonographer:     Delcie Roch Referring Phys:  [id number] HQION Diagnosing Phys: Orpah Cobb MD IMPRESSIONS  1. Left ventricular ejection fraction, by estimation, is 50%. The left ventricle has mildly decreased function. The left ventricle demonstrates global hypokinesis. Left ventricular diastolic parameters are indeterminate.  2. Right ventricular systolic function is normal. The right ventricular size is normal. The estimated right ventricular systolic pressure is 36.7 mmHg.  3. Left atrial size was mildly dilated.  4. The mitral valve is normal in structure. Trivial mitral valve regurgitation. No evidence of mitral stenosis.  5. The aortic valve is tricuspid. Aortic valve regurgitation is not visualized. Mild aortic valve sclerosis is present, with no evidence of aortic valve stenosis.  6. The inferior vena cava is dilated in size with <50% respiratory variability, suggesting right atrial pressure of 15 mmHg. FINDINGS  Left Ventricle: Left ventricular ejection fraction, by estimation, is 50%. The left ventricle has mildly decreased function. The left ventricle demonstrates global hypokinesis. The left ventricular internal cavity size was normal in size. There is no left ventricular hypertrophy. Left ventricular diastolic parameters are indeterminate. Right Ventricle: The right ventricular size is normal. No increase in  right ventricular wall thickness. Right  ventricular systolic function is normal. The tricuspid regurgitant velocity is 2.33 m/s, and with an assumed right atrial pressure of 15 mmHg, the estimated right ventricular systolic pressure is 36.7 mmHg. Left Atrium: Left atrial size was mildly dilated. Right Atrium: Right atrial size was normal in size. Pericardium: Trivial pericardial effusion is present. Mitral Valve: The mitral valve is normal in structure. Trivial mitral valve regurgitation. No evidence of mitral valve stenosis. Tricuspid Valve: The tricuspid valve is normal in structure. Tricuspid valve regurgitation is trivial. Aortic Valve: The aortic valve is tricuspid. Aortic valve regurgitation is not visualized. Mild aortic valve sclerosis is present, with no evidence of aortic valve stenosis. Aorta: The aortic root is normal in size and structure. Venous: The inferior vena cava is dilated in size with less than 50% respiratory variability, suggesting right atrial pressure of 15 mmHg. IAS/Shunts: No atrial level shunt detected by color flow Doppler. LEFT VENTRICLE PLAX 2D LVIDd:         5.50 cm Diastology LVIDs:         3.80 cm LV e' medial:  9.46 cm/s LV PW:         1.10 cm LV e' lateral: 10.60 cm/s LV IVS:        0.90 cm  IVC IVC diam: 2.70 cm LEFT ATRIUM         Index LA diam:    4.60 cm 2.07 cm/m  TRICUSPID VALVE TR Peak grad:   21.7 mmHg TR Vmax:        233.00 cm/s  Read by Dr. Phineas Douglas Electronically Amended 09/21/2020, 1:57 PM   Final (Updating)    ECHOCARDIOGRAM LIMITED  Addendum Date: 09/24/2020   Correction: Low LVEF new since previous study on 09/21/2020. [email or web address] Electronically Amended 09/24/2020, 1:23 PM   Final [facility])    Result Date: 09/24/2020    ECHOCARDIOGRAM LIMITED REPORT   Patient Name:   DERRIL FRANEK Date of Exam: 09/24/2020 Medical Rec #:  [id number]     Height:       70.0 in Accession #:    [id number]    Weight:       215.2 lb Date of Birth:  15-Feb-1956     BSA:          2.153 m Patient Age:    64 years       BP:           108/88 mmHg Patient Gender: M             HR:           131 bpm. Exam Location:  Inpatient Procedure: Limited Echo, Limited Color Doppler and Cardiac Doppler Indications:     Pericardial effusion 423.9 / I31.3  History:         Patient has prior history of Echocardiogram examinations, most                  recent 09/21/2020. CHF, Arrythmias:Atrial Fibrillation and                  Atrial Flutter, Signs/Symptoms:Dyspnea; Risk                  Factors:Hypertension and Current Smoker. Pulmonary embolism.                  Pericardial effusion.  Sonographer:     Renella Cunas RDCS Referring Phys:  [id number] Tessa Lerner Diagnosing Phys: Truett Mainland MD IMPRESSIONS  1.  Left ventricular ejection fraction, by estimation, is 20 to 25%. The left ventricle has severely decreased function. The left ventricle demonstrates global hypokinesis.  2. No significant pericardial effusion     Moderate left sided pleural effusion.  3. The inferior vena cava is normal in size with greater than 50% respiratory variability, suggesting right atrial pressure of 3 mmHg  4. No significant change compared to previous study on 09/21/2020. FINDINGS  Left Ventricle: Left ventricular ejection fraction, by estimation, is 20 to 25%. The left ventricle has severely decreased function. The left ventricle demonstrates global hypokinesis. Pericardium: No significant pericardial effusion Moderate left sided pleural effusion. Venous: The inferior vena cava is normal in size with greater than 50% respiratory variability, suggesting right atrial pressure of 3 mmHg. Additional Comments: There is no pleural effusion in the left lateral region. LEFT VENTRICLE PLAX 2D LVIDd:         5.00 cm LVIDs:         4.40 cm LV PW:         0.90 cm LV IVS:        0.90 cm LVOT diam:     2.40 cm LVOT Area:     4.52 cm  LEFT ATRIUM         Index LA diam:    4.90 cm 2.28 cm/m   AORTA Ao Root diam: 3.50 cm  SHUNTS Systemic Diam: 2.40 cm Truett Mainland MD  Electronically signed by Truett Mainland MD Signature Date/Time: 09/24/2020/1:21:57 PM    Final (Amended)    IR THORACENTESIS ASP PLEURAL SPACE W/IMG GUIDE  Result Date: 09/20/2020 INDICATION: Patient with history of HF, atrial fibrillation/flutter, dyspnea, and left pleural effusion. Request is made for diagnostic and therapeutic left thoracentesis. EXAM: ULTRASOUND GUIDED DIAGNOSTIC AND THERAPEUTIC LEFT THORACENTESIS MEDICATIONS: 10 mL 1% lidocaine COMPLICATIONS: None immediate. PROCEDURE: An ultrasound guided thoracentesis was thoroughly discussed with the patient and questions answered. The benefits, risks, alternatives and complications were also discussed. The patient understands and wishes to proceed with the procedure. Written consent was obtained. Ultrasound was performed to localize and mark an adequate pocket of fluid in the left chest. The area was then prepped and draped in the normal sterile fashion. 1% Lidocaine was used for local anesthesia. Under ultrasound guidance a 6 Fr Safe-T-Centesis catheter was introduced. Thoracentesis was performed. The catheter was removed and a dressing applied. FINDINGS: A total of approximately 1.5 L of hazy amber fluid was removed. Procedure was stopped after 1.5 L secondary to patient's hypotension (73/63, 92/74) - catheter was removed and patient was laid back in bed with BP improvement (106/87). Patient remained asymptomatic throughout procedure (denied dizziness, vision changes). Samples were sent to the laboratory as requested by the clinical team. IMPRESSION: Successful ultrasound guided left thoracentesis yielding 1.5 L of pleural fluid. Read by: Elwin Mocha, PA-C Electronically Signed   By: Marliss Coots MD   On: 09/20/2020 08:35   US THORACENTESIS ASP PLEURAL SPACE W/IMG GUIDE  Result Date: 09/23/2020 INDICATION: Diastolic heart failure with left pleural effusion. Request for diagnostic and therapeutic thoracentesis. EXAM: ULTRASOUND GUIDED LEFT  THORACENTESIS MEDICATIONS: 1% lidocaine 10 mL COMPLICATIONS: None immediate. PROCEDURE: An ultrasound guided thoracentesis was thoroughly discussed with the patient and questions answered. The benefits, risks, alternatives and complications were also discussed. The patient understands and wishes to proceed with the procedure. Written consent was obtained. Ultrasound was performed to localize and mark an adequate pocket of fluid in the left chest. The area was then prepped and draped  in the normal sterile fashion. 1% Lidocaine was used for local anesthesia. Under ultrasound guidance a 6 Fr Safe-T-Centesis catheter was introduced. Thoracentesis was performed. The catheter was removed and a dressing applied. FINDINGS: A total of approximately 650 mL of hazy peach colored fluid was removed. Samples were sent to the laboratory as requested by the clinical team. IMPRESSION: Successful ultrasound guided left thoracentesis yielding 650 mL of pleural fluid. No pneumothorax on post-procedure chest x-ray. Read by: Corrin Parker, PA-C Electronically Signed   By: Irish Lack M.D.   On: 09/23/2020 11:20       Discharge Exam: Vitals:   09/25/20 2340 09/26/20 0519  BP: 90/72 125/68  Pulse: 86   Resp: 17 18  Temp: 98.4 F (36.9 C) 98.2 F (36.8 C)  SpO2: 95%     General: Pt is alert, awake, not in acute distress Cardiovascular: Tachycardic, regular rhythm, heart rate 100, S1/S2 +, no edema Respiratory: CTA bilaterally, no wheezing, no rhonchi, no respiratory distress, no conversational dyspnea, on room air Abdominal: Soft, NT, ND, bowel sounds + Extremities: no edema, no cyanosis Psych: Normal mood and affect, stable judgement and insight     The results of significant diagnostics from this hospitalization (including imaging, microbiology, ancillary and laboratory) are listed below for reference.     Microbiology: Recent Results (from the past 240 hour(s))  Blood culture (routine single)     Status:  None   Collection Time: 09/19/20 11:33 AM   Specimen: BLOOD  Result Value Ref Range Status   Specimen Description BLOOD BLOOD RIGHT HAND  Final   Special Requests   Final    BOTTLES DRAWN AEROBIC AND ANAEROBIC Blood Culture adequate volume   Culture   Final    NO GROWTH 5 DAYS Performed at [facility], [address], [place], [place] [address]    Report Status 09/24/2020 FINAL  Final  Urine culture     Status: Abnormal   Collection Time: 09/19/20 11:33 AM   Specimen: In/Out Cath Urine  Result Value Ref Range Status   Specimen Description IN/OUT CATH URINE  Final   Special Requests   Final    NONE Performed at [facility], [address] [place], [place] [address]    Culture 4,000 COLONIES/mL ESCHERICHIA COLI (A)  Final   Report Status 09/21/2020 FINAL  Final   Organism ID, Bacteria ESCHERICHIA COLI (A)  Final      Susceptibility   Escherichia coli - MIC*    AMPICILLIN 8 SENSITIVE Sensitive     CEFAZOLIN <=4 SENSITIVE Sensitive     CEFEPIME <=0.12 SENSITIVE Sensitive     CEFTRIAXONE <=0.25 SENSITIVE Sensitive     CIPROFLOXACIN <=0.25 SENSITIVE Sensitive     GENTAMICIN <=1 SENSITIVE Sensitive     IMIPENEM <=0.25 SENSITIVE Sensitive     NITROFURANTOIN <=16 SENSITIVE Sensitive     TRIMETH/SULFA <=20 SENSITIVE Sensitive     AMPICILLIN/SULBACTAM <=2 SENSITIVE Sensitive     PIP/TAZO <=4 SENSITIVE Sensitive     * 4,000 COLONIES/mL ESCHERICHIA COLI  Culture, blood (single)     Status: None   Collection Time: 09/19/20 12:15 PM   Specimen: BLOOD LEFT FOREARM  Result Value Ref Range Status   Specimen Description BLOOD LEFT FOREARM  Final   Special Requests   Final    BOTTLES DRAWN AEROBIC AND ANAEROBIC Blood Culture adequate volume   Culture   Final    NO GROWTH 5 DAYS Performed at [facility], [address] [place], [place] [address]    Report Status 09/24/2020 FINAL  Final  Respiratory Panel by RT PCR (Flu A&B, Covid) - Nasopharyngeal Swab     Status:  None   Collection Time: 09/19/20  1:11 PM   Specimen: Nasopharyngeal Swab  Result Value Ref Range Status   SARS Coronavirus 2 by RT PCR NEGATIVE NEGATIVE Final    Comment: (NOTE) SARS-CoV-2 target nucleic acids are NOT DETECTED.  The SARS-CoV-2 RNA is generally detectable in upper respiratoy specimens during the acute phase of infection. The lowest concentration of SARS-CoV-2 viral copies this assay can detect is 131 copies/mL. A negative result does not preclude SARS-Cov-2 infection and should not be used as the sole basis for treatment or other patient management decisions. A negative result may occur with  improper specimen collection/handling, submission of specimen other than nasopharyngeal swab, presence of viral mutation(s) within the areas targeted by this assay, and inadequate number of viral copies (<131 copies/mL). A negative result must be combined with clinical observations, patient history, and epidemiological information. The expected result is Negative.  Fact Sheet for Patients:  https://www.moore.com/  Fact Sheet for Healthcare Providers:  https://www.young.biz/  This test is no t yet approved or cleared by the [place] FDA and  has been authorized for detection and/or diagnosis of SARS-CoV-2 by FDA under an Emergency Use Authorization (EUA). This EUA will remain  in effect (meaning this test can be used) for the duration of the COVID-19 declaration under Section 564(b)(1) of the Act, 21 U.S.C. section 360bbb-3(b)(1), unless the authorization is terminated or revoked sooner.     Influenza A by PCR NEGATIVE NEGATIVE Final   Influenza B by PCR NEGATIVE NEGATIVE Final    Comment: (NOTE) The Xpert Xpress SARS-CoV-2/FLU/RSV assay is intended as an aid in  the diagnosis of influenza from Nasopharyngeal swab specimens and  should not be used as a sole basis for treatment. Nasal washings and  aspirates are unacceptable for  Xpert Xpress SARS-CoV-2/FLU/RSV  testing.  Fact Sheet for Patients: https://www.moore.com/  Fact Sheet for Healthcare Providers: https://www.young.biz/  This test is not yet approved or cleared by the [place] FDA and  has been authorized for detection and/or diagnosis of SARS-CoV-2 by  FDA under an Emergency Use Authorization (EUA). This EUA will remain  in effect (meaning this test can be used) for the duration of the  Covid-19 declaration under Section 564(b)(1) of the Act, 21  U.S.C. section 360bbb-3(b)(1), unless the authorization is  terminated or revoked. Performed at [facility], [address], [place], [place] [address]   Culture, body fluid-bottle     Status: None   Collection Time: 09/20/20  8:35 AM   Specimen: Pleura  Result Value Ref Range Status   Specimen Description PLEURAL FLUID  Final   Special Requests LEFT LUNG  Final   Culture   Final    NO GROWTH 5 DAYS Performed at [facility], [address], [place], [place] [address]    Report Status 09/25/2020 FINAL  Final  Gram stain     Status: None   Collection Time: 09/20/20  8:35 AM   Specimen: Pleura  Result Value Ref Range Status   Specimen Description PLEURAL FLUID  Final   Special Requests LEFT LUNG  Final   Gram Stain   Final    WBC PRESENT,BOTH PMN AND MONONUCLEAR NO ORGANISMS SEEN CYTOSPIN SMEAR Performed at [facility], [address] [place], [place] [address]  Report Status 09/20/2020 FINAL  Final  Expectorated sputum assessment w rflx to resp cult     Status: None   Collection Time: 09/22/20  3:44 PM   Specimen: Expectorated Sputum  Result Value Ref Range Status   Specimen Description EXPECTORATED SPUTUM  Final   Special Requests NONE  Final   Sputum evaluation   Final    THIS SPECIMEN IS ACCEPTABLE FOR SPUTUM CULTURE Performed at [facility], [address] [place], [place] [address]    Report Status 09/22/2020  FINAL  Final  Culture, respiratory     Status: None   Collection Time: 09/22/20  3:44 PM  Result Value Ref Range Status   Specimen Description EXPECTORATED SPUTUM  Final   Special Requests NONE Reflexed from [id number]  Final   Gram Stain   Final    RARE WBC PRESENT, PREDOMINANTLY PMN RARE GRAM POSITIVE COCCI IN PAIRS RARE GRAM NEGATIVE RODS    Culture   Final    FEW Normal respiratory flora-no Staph aureus or Pseudomonas seen Performed at [facility], [address] [place], [place] [address]    Report Status 09/25/2020 FINAL  Final     Labs: BNP (last 3 results) Recent Labs    09/19/20 1135 09/21/20 0441 09/25/20 0506  BNP 149.7* 86.4 235.7*   Basic Metabolic Panel: Recent Labs  Lab 09/20/20 0500 09/20/20 0500 09/21/20 0441 09/21/20 0441 09/22/20 0420 09/23/20 0138 09/24/20 0452 09/25/20 0506 09/26/20 0536  NA 138   < > 138   < > 134* 137 134* 136 137  K 4.0   < > 3.5   < > 3.1* 2.9* 3.1* 4.0 3.9  CL 101   < > 97*   < > 87* 88* 91* 95* 99  CO2 28   < > 32   < > 36* 37* 34* 32 33*  GLUCOSE 112*   < > 90   < > 157* 88 117* 116* 89  BUN 13   < > 14   < > 10 10 16 14 11   CREATININE 0.93   < > 0.88   < > 0.88 0.98 0.97 0.94 0.88  CALCIUM 8.8*   < > 8.9   < > 9.1 9.5 9.0 9.0 8.7*  MG 2.1  --  2.1  --  1.9  --   --   --   --    < > = values in this interval not displayed.   Liver Function Tests: Recent Labs  Lab 09/19/20 1133  AST 15  ALT 27  ALKPHOS 92  BILITOT 0.7  PROT 6.0*  ALBUMIN 2.3*   No results for input(s): LIPASE, AMYLASE in the last 168 hours. No results for input(s): AMMONIA in the last 168 hours. CBC: Recent Labs  Lab 09/19/20 1133 09/20/20 0400 09/23/20 0138 09/26/20 0536  WBC 17.0* 12.6* 10.7* 11.1*  NEUTROABS 15.0*  --   --   --   HGB 12.4* 12.3* 12.6* 12.8*  HCT 39.8 39.5 38.3* 40.5  MCV 102.6* 104.8* 96.5 98.5  PLT 366 333 369 349   Cardiac Enzymes: No results for input(s): CKTOTAL, CKMB, CKMBINDEX, TROPONINI in the last  168 hours. BNP: Invalid input(s): POCBNP CBG: No results for input(s): GLUCAP in the last 168 hours. D-Dimer No results for input(s): DDIMER in the last 72 hours. Hgb A1c No results for input(s): HGBA1C in the last 72 hours. Lipid Profile No results for input(s): CHOL, HDL, LDLCALC, TRIG, CHOLHDL, LDLDIRECT in  the last 72 hours. Thyroid function studies No results for input(s): TSH, T4TOTAL, T3FREE, THYROIDAB in the last 72 hours.  Invalid input(s): FREET3 Anemia work up No results for input(s): VITAMINB12, FOLATE, FERRITIN, TIBC, IRON, RETICCTPCT in the last 72 hours. Urinalysis    Component Value Date/Time   COLORURINE YELLOW 09/19/2020 1133   APPEARANCEUR CLEAR 09/19/2020 1133   LABSPEC 1.017 09/19/2020 1133   PHURINE 5.0 09/19/2020 1133   GLUCOSEU NEGATIVE 09/19/2020 1133   HGBUR SMALL (A) 09/19/2020 1133   BILIRUBINUR NEGATIVE 09/19/2020 1133   KETONESUR NEGATIVE 09/19/2020 1133   PROTEINUR NEGATIVE 09/19/2020 1133   NITRITE NEGATIVE 09/19/2020 1133   LEUKOCYTESUR NEGATIVE 09/19/2020 1133   Sepsis Labs Invalid input(s): PROCALCITONIN,  WBC,  LACTICIDVEN Microbiology Recent Results (from the past 240 hour(s))  Blood culture (routine single)     Status: None   Collection Time: 09/19/20 11:33 AM   Specimen: BLOOD  Result Value Ref Range Status   Specimen Description BLOOD BLOOD RIGHT HAND  Final   Special Requests   Final    BOTTLES DRAWN AEROBIC AND ANAEROBIC Blood Culture adequate volume   Culture   Final    NO GROWTH 5 DAYS Performed at [facility], [address], [place], [place] [address]    Report Status 09/24/2020 FINAL  Final  Urine culture     Status: Abnormal   Collection Time: 09/19/20 11:33 AM   Specimen: In/Out Cath Urine  Result Value Ref Range Status   Specimen Description IN/OUT CATH URINE  Final   Special Requests   Final    NONE Performed at [facility], [address] [place], [place] [address]    Culture 4,000 COLONIES/mL  ESCHERICHIA COLI (A)  Final   Report Status 09/21/2020 FINAL  Final   Organism ID, Bacteria ESCHERICHIA COLI (A)  Final      Susceptibility   Escherichia coli - MIC*    AMPICILLIN 8 SENSITIVE Sensitive     CEFAZOLIN <=4 SENSITIVE Sensitive     CEFEPIME <=0.12 SENSITIVE Sensitive     CEFTRIAXONE <=0.25 SENSITIVE Sensitive     CIPROFLOXACIN <=0.25 SENSITIVE Sensitive     GENTAMICIN <=1 SENSITIVE Sensitive     IMIPENEM <=0.25 SENSITIVE Sensitive     NITROFURANTOIN <=16 SENSITIVE Sensitive     TRIMETH/SULFA <=20 SENSITIVE Sensitive     AMPICILLIN/SULBACTAM <=2 SENSITIVE Sensitive     PIP/TAZO <=4 SENSITIVE Sensitive     * 4,000 COLONIES/mL ESCHERICHIA COLI  Culture, blood (single)     Status: None   Collection Time: 09/19/20 12:15 PM   Specimen: BLOOD LEFT FOREARM  Result Value Ref Range Status   Specimen Description BLOOD LEFT FOREARM  Final   Special Requests   Final    BOTTLES DRAWN AEROBIC AND ANAEROBIC Blood Culture adequate volume   Culture   Final    NO GROWTH 5 DAYS Performed at [facility], [address] [place], [place] [address]    Report Status 09/24/2020 FINAL  Final  Respiratory Panel by RT PCR (Flu A&B, Covid) - Nasopharyngeal Swab     Status: None   Collection Time: 09/19/20  1:11 PM   Specimen: Nasopharyngeal Swab  Result Value Ref Range Status   SARS Coronavirus 2 by RT PCR NEGATIVE NEGATIVE Final    Comment: (NOTE) SARS-CoV-2 target nucleic acids are NOT DETECTED.  The SARS-CoV-2 RNA is generally detectable in upper respiratoy specimens during the acute phase of infection. The lowest concentration of SARS-CoV-2  viral copies this assay can detect is 131 copies/mL. A negative result does not preclude SARS-Cov-2 infection and should not be used as the sole basis for treatment or other patient management decisions. A negative result may occur with  improper specimen collection/handling, submission of specimen other than nasopharyngeal swab, presence of  viral mutation(s) within the areas targeted by this assay, and inadequate number of viral copies (<131 copies/mL). A negative result must be combined with clinical observations, patient history, and epidemiological information. The expected result is Negative.  Fact Sheet for Patients:  https://www.moore.com/  Fact Sheet for Healthcare Providers:  https://www.young.biz/  This test is no t yet approved or cleared by the [place] FDA and  has been authorized for detection and/or diagnosis of SARS-CoV-2 by FDA under an Emergency Use Authorization (EUA). This EUA will remain  in effect (meaning this test can be used) for the duration of the COVID-19 declaration under Section 564(b)(1) of the Act, 21 U.S.C. section 360bbb-3(b)(1), unless the authorization is terminated or revoked sooner.     Influenza A by PCR NEGATIVE NEGATIVE Final   Influenza B by PCR NEGATIVE NEGATIVE Final    Comment: (NOTE) The Xpert Xpress SARS-CoV-2/FLU/RSV assay is intended as an aid in  the diagnosis of influenza from Nasopharyngeal swab specimens and  should not be used as a sole basis for treatment. Nasal washings and  aspirates are unacceptable for Xpert Xpress SARS-CoV-2/FLU/RSV  testing.  Fact Sheet for Patients: https://www.moore.com/  Fact Sheet for Healthcare Providers: https://www.young.biz/  This test is not yet approved or cleared by the [place] FDA and  has been authorized for detection and/or diagnosis of SARS-CoV-2 by  FDA under an Emergency Use Authorization (EUA). This EUA will remain  in effect (meaning this test can be used) for the duration of the  Covid-19 declaration under Section 564(b)(1) of the Act, 21  U.S.C. section 360bbb-3(b)(1), unless the authorization is  terminated or revoked. Performed at [facility] [facility], [address] [place], [place] [address]   Culture, body fluid-bottle      Status: None   Collection Time: 09/20/20  8:35 AM   Specimen: Pleura  Result Value Ref Range Status   Specimen Description PLEURAL FLUID  Final   Special Requests LEFT LUNG  Final   Culture   Final    NO GROWTH 5 DAYS Performed at [facility], [address], [place], [place] [address]    Report Status 09/25/2020 FINAL  Final  Gram stain     Status: None   Collection Time: 09/20/20  8:35 AM   Specimen: Pleura  Result Value Ref Range Status   Specimen Description PLEURAL FLUID  Final   Special Requests LEFT LUNG  Final   Gram Stain   Final    WBC PRESENT,BOTH PMN AND MONONUCLEAR NO ORGANISMS SEEN CYTOSPIN SMEAR Performed at [facility], [address], [place], [place] [address]    Report Status 09/20/2020 FINAL  Final  Expectorated sputum assessment w rflx to resp cult     Status: None   Collection Time: 09/22/20  3:44 PM   Specimen: Expectorated Sputum  Result Value Ref Range Status   Specimen Description EXPECTORATED SPUTUM  Final   Special Requests NONE  Final   Sputum evaluation   Final    THIS SPECIMEN IS ACCEPTABLE FOR SPUTUM CULTURE Performed at [facility], [address], [place], [place] [address]    Report Status 09/22/2020 FINAL  Final  Culture, respiratory     Status: None   Collection Time: 09/22/20  3:44 PM  Result Value Ref Range Status   Specimen Description EXPECTORATED SPUTUM  Final   Special Requests NONE Reflexed from [id number]  Final   Gram Stain   Final    RARE WBC PRESENT, PREDOMINANTLY PMN RARE GRAM POSITIVE COCCI IN PAIRS RARE GRAM NEGATIVE RODS    Culture   Final    FEW Normal respiratory flora-no Staph aureus or Pseudomonas seen Performed at [facility], [address], [place], [place] [address]    Report Status 09/25/2020 FINAL  Final     Patient was seen and examined on the day of discharge and was found to be in stable condition. Time coordinating discharge: 40 minutes including assessment and coordination  of care, as well as examination of the patient.   SIGNED:  Noralee Stain, DO [facility] 09/26/2020, 10:47 AM

## 2020-09-26 NOTE — Progress Notes (Signed)
Progress Note  Patient Name: Carl [name] Date of Encounter: 09/26/2020  Attending physician: Noralee Stain, DO Primary care provider: Roger Kill, PA-C Primary Cardiologist: Dr. Yates Decamp Consultant:Kawanna Christley C Waylon Koffler, PA-C  Subjective: Patient seen and evaluated 09/26/2020 at 08:00am.   Carl [name] is a 64 y.o. male with paroxysmal typical atrial flutter with RVR, history of pulmonary embolism when he presented to the ED on 08/27/2020, has had cardioversion but reverted back to atrial flutter, readmitted to the hospital with acute diastolic heart failure, recurrence of atrial flutter with RVR and bilateral pneumonia and pleural effusion.  Underwent left thoracentesis with aspiration of 1.5 L of fluid.  Patient was back in atrial fibrillation with RVR, was placed back on IV amiodarone but failed to maintain sinus rhythm, therefore amiodarone was stopped. Patient underwent cardiac catheterization yesterday for evaluation of ischemic etiology of atrial fibrillation/flutter.  Coronary angiography revealed no significant coronary artery disease.  Echocardiogram revealed significantly reduced LVEF at 20-25%, in view of this diltiazem was stopped to avoid negative inotrope.   Patient is presently in sinus rhythm with well controlled rate.  Patient is feeling well, denies chest pain, dyspnea, dizziness, syncope, near syncope.  Review of Systems:  Review of Systems  Constitutional: Negative.   HENT: Negative.   Eyes: Negative.   Respiratory: Negative for sputum production and shortness of breath.   Cardiovascular: Positive for leg swelling. Negative for chest pain, palpitations, orthopnea and PND.  Gastrointestinal: Negative for blood in stool and melena.  Genitourinary: Negative for hematuria.  Musculoskeletal: Negative for falls.       Left forearm pain and swelling - likely due to infiltration of IV.   Skin: Negative.   Neurological: Negative for dizziness, weakness and  headaches.  Endo/Heme/Allergies: Negative.   Psychiatric/Behavioral: Negative.    Objective: Vital Signs in the last 24 hours: Temp:  [97.8 F (36.6 C)-98.8 F (37.1 C)] 98.2 F (36.8 C) (11/17 0519) Pulse Rate:  [0-140] 86 (11/16 2340) Resp:  [0-56] 18 (11/17 0519) BP: (88-125)/(60-78) 125/68 (11/17 0519) SpO2:  [93 %-100 %] 95 % (11/16 2340) Weight:  [98.5 kg] 98.5 kg (11/17 0519)  Intake/Output:  Intake/Output Summary (Last 24 hours) at 09/26/2020 0729 Last data filed at 09/26/2020 0500 Gross per 24 hour  Intake 1006.14 ml  Output 1250 ml  Net -243.86 ml    Net IO Since Admission: -14,468.43 mL [09/26/20 0729]  Weights:  Filed Weights   09/24/20 0100 09/25/20 0400 09/26/20 0519  Weight: 97.6 kg 98.4 kg 98.5 kg   PHYSICAL EXAM: Vitals with BMI 09/26/2020 09/25/2020 09/25/2020  Height - - -  Weight 217 lbs 3 oz - -  BMI 31.16 - -  Systolic 125 90 113  Diastolic 68 72 65  Pulse - 86 89   Physical Exam Constitutional:      Appearance: He is obese.  HENT:     Head: Normocephalic and atraumatic.  Eyes:     Extraocular Movements: Extraocular movements intact.  Neck:     Vascular: No carotid bruit.  Cardiovascular:     Rate and Rhythm: Normal rate and regular rhythm.     Pulses: Normal pulses and intact distal pulses.     Heart sounds: Normal heart sounds. No murmur heard.  No gallop.      Comments: No JVD. Minimal bilateral lower leg edema  Radial catheter site without erythema, ecchymosis, or tenderness.  Pulmonary:     Effort: Pulmonary effort is normal.     Breath sounds: Normal breath  sounds.  Abdominal:     General: Bowel sounds are normal.     Palpations: Abdomen is soft.     Hernia: A hernia is present.  Musculoskeletal:        General: Swelling (Left forearm and hand) and tenderness (Left forearm and hand ) present.     Cervical back: Neck supple.  Skin:    General: Skin is warm and dry.     Findings: Erythema (Left forearm and hand) present.    Neurological:     General: No focal deficit present.     Mental Status: He is oriented to person, place, and time.  Psychiatric:        Mood and Affect: Mood normal.        Behavior: Behavior normal.     Lab Results: Hematology Recent Labs  Lab 09/20/20 0400 09/23/20 0138 09/26/20 0536  WBC 12.6* 10.7* 11.1*  RBC 3.77* 3.97* 4.11*  HGB 12.3* 12.6* 12.8*  HCT 39.5 38.3* 40.5  MCV 104.8* 96.5 98.5  MCH 32.6 31.7 31.1  MCHC 31.1 32.9 31.6  RDW 12.8 12.4 12.7  PLT 333 369 349    Chemistry Recent Labs  Lab 09/19/20 1133 09/20/20 0500 09/24/20 0452 09/25/20 0506 09/26/20 0536  NA 139   < > 134* 136 137  K 4.2   < > 3.1* 4.0 3.9  CL 104   < > 91* 95* 99  CO2 25   < > 34* 32 33*  GLUCOSE 112*   < > 117* 116* 89  BUN 10   < > 16 14 11   CREATININE 0.82   < > 0.97 0.94 0.88  CALCIUM 8.6*   < > 9.0 9.0 8.7*  PROT 6.0*  --   --   --   --   ALBUMIN 2.3*  --   --   --   --   AST 15  --   --   --   --   ALT 27  --   --   --   --   ALKPHOS 92  --   --   --   --   BILITOT 0.7  --   --   --   --   GFRNONAA >60   < > >60 >60 >60  ANIONGAP 10   < > 9 9 5    < > = values in this interval not displayed.     Cardiac Enzymes: Cardiac Panel (last 3 results) No results for input(s): CKTOTAL, CKMB, TROPONINIHS, RELINDX in the last 72 hours.  BNP (last 3 results) Recent Labs    09/19/20 1135 09/21/20 0441 09/25/20 0506  BNP 149.7* 86.4 235.7*    ProBNP (last 3 results) No results for input(s): PROBNP in the last 8760 hours.   DDimer No results for input(s): DDIMER in the last 168 hours.   Hemoglobin A1c: No results found for: HGBA1C, MPG  TSH  Recent Labs    08/27/20 1430 09/21/20 0441  TSH 0.457 2.577    Lipid Panel No results found for: CHOL, TRIG, HDL, CHOLHDL, VLDL, LDLCALC, LDLDIRECT  RADIOLOGY:  CT Angio Chest PE W and/or Wo Contrast. Date: 08/27/2020 CLINICAL DATA: Shortness of breath and chest pain  FINDINGS: Cardiovascular: Satisfactory  opacification of the pulmonary arteries to the segmental level. There are filling defects in segmental branches of bilateral upper and right middle lobes. Additional suspected filling defects within subsegmental branches. Left atrial enlargement. No evidence of right heart strain. No pericardial effusion. Mediastinum/Nodes:  There are no enlarged lymph nodes identified. Thyroid and esophagus are unremarkable. Lungs/Pleura: Patchy areas of atelectasis. No pleural effusion or pneumothorax. Upper Abdomen: No acute abnormality. Musculoskeletal: No acute osseous abnormality. Review of the MIP images confirms the above findings.  IMPRESSION: Acute segmental and subsegmental pulmonary emboli bilaterally. No evidence of right heart strain. Patchy bilateral atelectasis.  DG Chest 1 View  09/22/2020 CLINICAL DATA:  Left-sided thoracentesis today EXAM: CHEST  1 VIEW COMPARISON:  09/20/2020 FINDINGS: Marked reduction in size of the left pleural effusion. No significant pneumothorax. Mild atelectasis and potentially a small amount of residual effusion at the left lung base. Thoracic spondylosis. Borderline enlargement of the cardiopericardial silhouette  CT scan chest without contrast 09/23/2020: 1. Moderate size pericardial effusion has developed since the previous study. 2. Trace amount of right pleural fluid layering dependently with minimal dependent atelectasis. Small left effusion layering dependently with mild dependent atelectasis. 3. Small pulmonary emboli seen previously cannot be visualized in the absence of contrast. 4. Aortic atherosclerosis.  Coronary artery calcification.  Cardiac database: EKG: 09/19/2020: Atrial flutter, 158 bpm, left axis deviation, poor R wave progression, without underlying injury pattern.  09/23/2020: Atrial fibrillation with rapid ventricular response at a rate of 147 bpm.  Left axis deviation.  Nonspecific T wave abnormality.  No evidence of ischemia.  Telemetry:  Personally reviewed.  Patient presently sinus rhythm with occasional PVCs.   Echocardiogram: 08/28/2020:  1. Left ventricular ejection fraction, by estimation, is 50 to 55%. The left ventricle has low normal function. The left ventricle has no regional wall motion abnormalities. Left ventricular diastolic parameters were normal. 2. Right ventricular systolic function is normal. The right ventricular size is normal. There is moderately elevated pulmonary artery systolic pressure. The estimated right ventricular systolic pressure is 40.6 mmHg. 3. The mitral valve is normal in structure. Trivial mitral valve regurgitation. No evidence of mitral stenosis. 4. The aortic valve is normal in structure. Aortic valve regurgitation is not visualized. 5. The inferior vena cava is dilated in size with <50% respiratory variability, suggesting right atrial pressure of 15 mmHg.  Echocardiogram 09/24/2020:  1. Left ventricular ejection fraction, by estimation, is 20 to 25%. The left ventricle has severely decreased function. The left ventricle demonstrates global hypokinesis.  2. No significant pericardial effusion.  Moderate left sided pleural effusion.  3. The inferior vena cava is normal in size with greater than 50% respiratory variability, suggesting right atrial pressure of 3 mmHg  4. No significant change compared to previous study on 09/21/2020.  Left heart catheterization coronary angiography 09/25/2020: LM: Normal LAD: Type 1 LAD that does not reach apex          Medial calcification without any luminal stenosis         Large Diag 1 that reaches apex and also gives off septal perforators, making it a dual LAD system          No other diagonals present LCx: Normal Ramus: Normal RCA: Large, gives of several septal perforators, reaches apex         Minimal luminal irregularities  No significant coronary artery disease   Scheduled Meds: . allopurinol  100 mg Oral q AM  . apixaban  5 mg Oral  BID  . aspirin EC  81 mg Oral Daily  . atorvastatin  10 mg Oral Daily  . bumetanide  1 mg Oral BID  . doxycycline  100 mg Oral Q12H  . metoprolol succinate  50 mg Oral Daily  . sodium chloride  flush  3 mL Intravenous Q12H  . spironolactone  25 mg Oral Daily    Continuous Infusions: . sodium chloride    . cefTRIAXone (ROCEPHIN)  IV 2 g (09/25/20 1353)   PRN Meds: sodium chloride, acetaminophen, acetaminophen, levalbuterol, ondansetron (ZOFRAN) IV, oxyCODONE-acetaminophen, polyethylene glycol, polyvinyl alcohol, sodium chloride flush   IMPRESSION & RECOMMENDATIONS: Kosisochukwu Burningham is a 64 y.o. male whose past medical history and cardiac risk factors include: Pulmonary embolism, atrial flutter/fibrillation, dyslipidemia, hypertension, premature CAD in the family, smokes cigars, obesity due to excess calories.  1.  Atrial fibrillation with rapid ventricular response, paroxysmal. Presently in sinus rhythm with controlled ventricular rate.  CHA2DS2-VASc Score is 3.  Yearly risk of stroke: 3.8% (HTN, Vasc Dz, CHF).  2. Acute systolic heart failure Likely secondary to underlying symptomatic atrial fibrillation/flutter with RVR.   3.  Tobacco use disorder Patient counseled regarding continued smoking cessation.   4.  Moderate obesity Patient counseled regarding diet and lifestyle modifications.   5. Alcohol use disorder Patient counseled regarding continued abstinence from alcohol use. .   6.  Moderate pericardial effusion by CT scan  Echocardiogram evaluation revealed no significant pericardial effusion. Patient does have persistent large left pleural effusion.   Recommendation per Dr. Rosemary Holms 09/25/2020 post cath report:  Discussed with the patient re: management of paroxysmal Afib, as well prior h/o atrial flutter. Cardioversion unlikely to help, as he has been in paroxysmal Afib. His EF dropped from normal to 25-30% while in Afib, making rhythm control therapy important. I  discussed AAD vs ablation. He wants to avoid being on pills and would prefer consultation re: ablation. Will arrange this outpatient. Continue anticoagulation with eliquis and rate control with metoprolol succinate 50 mg daily.   From cardiac standpoint, patient is stable for discharge as rate is well controlled.  Follow up with cardiology outpatient 10/08/2020 at 3:00pm.  Referral to electrophysiology for evaluation of ablation.    Rayford Halsted, PA-C 09/26/2020, 10:26 AM Office: [phone]

## 2020-09-26 NOTE — Progress Notes (Signed)
D/C instructions given and reviewed. No questions asked but encouraged to call with any concerns. Tele and IV's removed, tolerated well. 

## 2020-09-27 LAB — CYTOLOGY - NON PAP

## 2020-10-01 ENCOUNTER — Inpatient Hospital Stay
Payer: Federal, State, Local not specified - PPO | Attending: Hematology and Oncology | Admitting: Hematology and Oncology

## 2020-10-01 ENCOUNTER — Encounter: Payer: Self-pay | Admitting: Hematology and Oncology

## 2020-10-01 ENCOUNTER — Other Ambulatory Visit: Payer: Self-pay

## 2020-10-01 DIAGNOSIS — E785 Hyperlipidemia, unspecified: Secondary | ICD-10-CM

## 2020-10-01 DIAGNOSIS — I700 Atherosclerosis of aorta: Secondary | ICD-10-CM | POA: Diagnosis not present

## 2020-10-01 DIAGNOSIS — I2699 Other pulmonary embolism without acute cor pulmonale: Secondary | ICD-10-CM | POA: Diagnosis not present

## 2020-10-01 DIAGNOSIS — F17210 Nicotine dependence, cigarettes, uncomplicated: Secondary | ICD-10-CM | POA: Insufficient documentation

## 2020-10-01 DIAGNOSIS — Z7901 Long term (current) use of anticoagulants: Secondary | ICD-10-CM | POA: Diagnosis not present

## 2020-10-01 DIAGNOSIS — Z8249 Family history of ischemic heart disease and other diseases of the circulatory system: Secondary | ICD-10-CM | POA: Diagnosis not present

## 2020-10-01 DIAGNOSIS — J9811 Atelectasis: Secondary | ICD-10-CM | POA: Diagnosis not present

## 2020-10-01 DIAGNOSIS — J90 Pleural effusion, not elsewhere classified: Secondary | ICD-10-CM | POA: Diagnosis not present

## 2020-10-01 DIAGNOSIS — Z86718 Personal history of other venous thrombosis and embolism: Secondary | ICD-10-CM

## 2020-10-01 DIAGNOSIS — A419 Sepsis, unspecified organism: Secondary | ICD-10-CM | POA: Diagnosis not present

## 2020-10-01 DIAGNOSIS — I340 Nonrheumatic mitral (valve) insufficiency: Secondary | ICD-10-CM | POA: Diagnosis not present

## 2020-10-01 DIAGNOSIS — R0602 Shortness of breath: Secondary | ICD-10-CM

## 2020-10-01 DIAGNOSIS — I4891 Unspecified atrial fibrillation: Secondary | ICD-10-CM | POA: Insufficient documentation

## 2020-10-01 DIAGNOSIS — J189 Pneumonia, unspecified organism: Secondary | ICD-10-CM | POA: Diagnosis not present

## 2020-10-01 DIAGNOSIS — Z801 Family history of malignant neoplasm of trachea, bronchus and lung: Secondary | ICD-10-CM

## 2020-10-01 DIAGNOSIS — J948 Other specified pleural conditions: Secondary | ICD-10-CM

## 2020-10-01 DIAGNOSIS — Z79899 Other long term (current) drug therapy: Secondary | ICD-10-CM

## 2020-10-01 DIAGNOSIS — M47814 Spondylosis without myelopathy or radiculopathy, thoracic region: Secondary | ICD-10-CM

## 2020-10-01 DIAGNOSIS — I5041 Acute combined systolic (congestive) and diastolic (congestive) heart failure: Secondary | ICD-10-CM

## 2020-10-01 DIAGNOSIS — Z86711 Personal history of pulmonary embolism: Secondary | ICD-10-CM

## 2020-10-01 DIAGNOSIS — I313 Pericardial effusion (noninflammatory): Secondary | ICD-10-CM

## 2020-10-01 DIAGNOSIS — I5021 Acute systolic (congestive) heart failure: Secondary | ICD-10-CM

## 2020-10-01 DIAGNOSIS — Z09 Encounter for follow-up examination after completed treatment for conditions other than malignant neoplasm: Secondary | ICD-10-CM | POA: Diagnosis not present

## 2020-10-01 DIAGNOSIS — I4892 Unspecified atrial flutter: Secondary | ICD-10-CM | POA: Diagnosis not present

## 2020-10-01 NOTE — Assessment & Plan Note (Addendum)
I reviewed with the patient about the plan for care for acute unprovoked PE.  This last episode of blood clot appeared to be unprovoked, but could be contributed by dehydration due to his excessive alcohol use and his age.  The abnormal thrombophilia panel was done in the setting of recent blood clot and is not consider diagnostic.  I do not plan to repeat this as it would not change management We discussed about various options of anticoagulation therapies including warfarin, low molecular weight heparin such as Lovenox or newer agents such as Rivaroxaban. Some of the risks and benefits discussed including costs involved, the need for monitoring, risks of life-threatening bleeding/hospitalization, reversibility of each agent in the event of bleeding or overdose, safety profile of each drug and taking into account other social issues such as ease of administration of medications, etc. Ultimately, we have made an informed decision for the patient to continue his treatment with apixaban    He tolerated oral anticoagulation therapy very well I recommend repeat CT imaging in 6 months for further follow-up

## 2020-10-01 NOTE — Assessment & Plan Note (Signed)
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

## 2020-10-01 NOTE — Assessment & Plan Note (Signed)
EczemaHe has minimal detectable pleural effusion on exam on the left I plan to repeat CT imaging in April

## 2020-10-01 NOTE — Assessment & Plan Note (Signed)
He is doing well on anticoagulation therapy I recommend minimum 6 months to a year from the PE standpoint but if he has chronic congestive heart failure from cardiomyopathy, he could benefit from longer term anticoagulation therapy

## 2020-10-01 NOTE — Progress Notes (Signed)
[facility] CONSULT NOTE  Patient Care Team: Bernita BuffyWilliams, Breejante J, PA-C as PCP - General (Physician Assistant)  CHIEF COMPLAINTS/PURPOSE OF CONSULTATION:  Acute PE, for further evaluation and management  HISTORY OF PRESENTING ILLNESS:  Carl [name] 64 y.o. male is here because of recent diagnosis of acute PE The patient was hospitalized in October with presentation with shortness of breath and was found to have acute PE According to the patient, he has been grieving since the death of his wife He was married for 25 years and she died from glioblastoma 5 to 6 years ago Since then, he has been drinking excessively to cope.  He usually would drink a large quantity of wine or bourbon, approximately half a bottle 4 to 5 days a week He notes he has been gaining and healthy weight and not taking good care of himself When he presented to the ER, with the shortness of breath, CT imaging show evidence of pulmonary emboli: Ct angio on 08/27/2020: Acute segmental and subsegmental pulmonary emboli bilaterally. No evidence of right heart strain. Patchy bilateral atelectasis.  In the course of his hospital stay, venous Doppler ultrasound was negative for DVT He has noted bilateral lower extremity edema for some time  On August 28, 2020, echocardiogram showed: 1. Left ventricular ejection fraction, by estimation, is 50 to 55%. The left ventricle has low normal function. The left ventricle has no regional wall motion abnormalities. Left ventricular diastolic parameters were normal.  2. Right ventricular systolic function is normal. The right ventricular size is normal. There is moderately elevated pulmonary artery systolic pressure. The estimated right ventricular systolic pressure is 40.6 mmHg.  3. The mitral valve is normal in structure. Trivial mitral valve regurgitation. No evidence of mitral stenosis.  4. The aortic valve is normal in structure. Aortic valve regurgitation is not  visualized.  5. The inferior vena cava is dilated in size with <50% respiratory variability, suggesting right atrial pressure of 15 mmHg.   He was admitted to the hospital again recently in November due to sepsis. He developed atrial flutter.  On November 12th, 2021, echocardiogram showed  1. Left ventricular ejection fraction, by estimation, is 50%. The left ventricle has mildly decreased function. The left ventricle demonstrates global hypokinesis. Left ventricular diastolic parameters are indeterminate.  2. Right ventricular systolic function is normal. The right ventricular size is normal. The estimated right ventricular systolic pressure is 36.7 mmHg.  3. Left atrial size was mildly dilated.  4. The mitral valve is normal in structure. Trivial mitral valve regurgitation. No evidence of mitral stenosis.  5. The aortic valve is tricuspid. Aortic valve regurgitation is not visualized. Mild aortic valve sclerosis is present, with no evidence of aortic valve stenosis.  6. The inferior vena cava is dilated in size with <50% respiratory variability, suggesting right atrial pressure of 15 mmHg.   Unfortunately, repeat echocardiogram on November 15 th, 2021 showed evidence of cardiomyopathy  1. Left ventricular ejection fraction, by estimation, is 20 to 25%. The left ventricle has severely decreased function. The left ventricle demonstrates global hypokinesis.  2. No significant pericardial effusion. Moderate left sided pleural effusion.  3. The inferior vena cava is normal in size with greater than 50% respiratory variability, suggesting right atrial pressure of 3 mmHg   Prior to diagnosis of PE,  He denies leg warmth, tenderness & erythema.  He denies recent chest pain on exertion, shortness of breath on minimal exertion, pre-syncopal episodes, hemoptysis, or palpitation. He denies recent history  of trauma, long distance travel, dehydration, recent surgery, smoking or prolonged  immobilization. He had no prior history or diagnosis of cancer. His age appropriate screening programs are up-to-date. He had prior surgeries before and never had perioperative thromboembolic events. He has used some over-the-counter testosterone replacement therapy for some time but none recently There is no family history of blood clots or miscarriages.  While in the hospital, thrombophilia panel was done on October 19 which showed low Antithrombin III level and protein C activity.  Since his recent hospitalization, he has quit drinking alcohol He has lost a lot of weight but he felt better The patient denies any recent signs or symptoms of bleeding such as spontaneous epistaxis, hematuria or hematochezia.  MEDICAL HISTORY:  Past Medical History:  Diagnosis Date  . Hypercholesteremia   . Primary hypertension   . Pulmonary embolism (HCC) 08/27/2020  . Typical atrial flutter (HCC) 08/27/2020 with PE    SURGICAL HISTORY: Past Surgical History:  Procedure Laterality Date  . IR THORACENTESIS ASP PLEURAL SPACE W/IMG GUIDE  09/20/2020  . LAPAROTOMY    . LEFT HEART CATH AND CORONARY ANGIOGRAPHY N/A 09/25/2020   Procedure: LEFT HEART CATH AND CORONARY ANGIOGRAPHY;  Surgeon: Elder Negus, MD;  Location: [facility] INVASIVE CV LAB;  Service: Cardiovascular;  Laterality: N/A;    SOCIAL HISTORY: Social History   Socioeconomic History  . Marital status: Widowed    Spouse name: Not on file  . Number of children: 2  . Years of education: Not on file  . Highest education level: Not on file  Occupational History  . Occupation: retired  Tobacco Use  . Smoking status: Current Every Day Smoker    Types: Cigars  . Smokeless tobacco: Never Used  . Tobacco comment: Once a week  Vaping Use  . Vaping Use: Never used  Substance and Sexual Activity  . Alcohol use: Yes    Alcohol/week: 1.0 standard drink    Types: 1 Glasses of wine per week    Comment: Occasional  . Drug use: Never  .  Sexual activity: Not on file  Other Topics Concern  . Not on file  Social History Narrative  . Not on file   Social Determinants of Health   Financial Resource Strain:   . Difficulty of Paying Living Expenses: Not on file  Food Insecurity:   . Worried About Programme researcher, broadcasting/film/video in the Last Year: Not on file  . Ran Out of Food in the Last Year: Not on file  Transportation Needs:   . Lack of Transportation (Medical): Not on file  . Lack of Transportation (Non-Medical): Not on file  Physical Activity:   . Days of Exercise per Week: Not on file  . Minutes of Exercise per Session: Not on file  Stress:   . Feeling of Stress : Not on file  Social Connections:   . Frequency of Communication with Friends and Family: Not on file  . Frequency of Social Gatherings with Friends and Family: Not on file  . Attends Religious Services: Not on file  . Active Member of Clubs or Organizations: Not on file  . Attends Banker Meetings: Not on file  . Marital Status: Not on file  Intimate Partner Violence:   . Fear of Current or Ex-Partner: Not on file  . Emotionally Abused: Not on file  . Physically Abused: Not on file  . Sexually Abused: Not on file    FAMILY HISTORY: Family History  Problem Relation  Age of Onset  . CAD Mother 42  . Heart attack Father   . Heart disease Brother   . Heart failure Brother   . Heart attack Brother   . Cancer Sister        lung    ALLERGIES:  has No Known Allergies.  MEDICATIONS:  Current Outpatient Medications  Medication Sig Dispense Refill  . allopurinol (ZYLOPRIM) 100 MG tablet Take 100 mg by mouth in the morning.     Marland Kitchen apixaban (ELIQUIS) 5 MG TABS tablet Take 1 tablet (5 mg total) by mouth 2 (two) times daily.    Marland Kitchen aspirin EC 81 MG EC tablet Take 1 tablet (81 mg total) by mouth daily. Swallow whole. 30 tablet 11  . atorvastatin (LIPITOR) 10 MG tablet Take 10 mg by mouth daily.    . Lactobacillus Rhamnosus, GG, (RA PROBIOTIC DIGESTIVE  CARE) CAPS Take 1 capsule by mouth in the morning.    . levalbuterol (XOPENEX HFA) 45 MCG/ACT inhaler Inhale 1 puff into the lungs every 4 (four) hours as needed for wheezing. 1 each 2  . metoprolol succinate (TOPROL-XL) 50 MG 24 hr tablet Take 1 tablet (50 mg total) by mouth daily. Take with or immediately following a meal. 30 tablet 1  . spironolactone (ALDACTONE) 25 MG tablet Take 1 tablet (25 mg total) by mouth daily. 30 tablet 1  . tadalafil (CIALIS) 20 MG tablet Take 20 mg by mouth daily as needed for erectile dysfunction.      No current facility-administered medications for this visit.    REVIEW OF SYSTEMS:   Constitutional: Denies fevers, chills or abnormal night sweats Eyes: Denies blurriness of vision, double vision or watery eyes Ears, nose, mouth, throat, and face: Denies mucositis or sore throat Respiratory: Denies cough, dyspnea or wheezes Cardiovascular: Denies palpitation, chest discomfort  Gastrointestinal:  Denies nausea, heartburn or change in bowel habits Skin: Denies abnormal skin rashes Lymphatics: Denies new lymphadenopathy or easy bruising Neurological:Denies numbness, tingling or new weaknesses Behavioral/Psych: Mood is stable, no new changes  All other systems were reviewed with the patient and are negative.  PHYSICAL EXAMINATION: ECOG PERFORMANCE STATUS: 1 - Symptomatic but completely ambulatory  Vitals:   10/01/20 1305  BP: 117/78  Pulse: 95  Resp: 18  Temp: 98.1 F (36.7 C)  SpO2: 98%   Filed Weights   10/01/20 1305  Weight: 220 lb 6.4 oz (100 kg)    GENERAL:alert, no distress and comfortable SKIN: skin color, texture, turgor are normal, no rashes or significant lesions EYES: normal, conjunctiva are pink and non-injected, sclera clear OROPHARYNX:no exudate, no erythema and lips, buccal mucosa, and tongue normal  NECK: supple, thyroid normal size, non-tender, without nodularity LYMPH:  no palpable lymphadenopathy in the cervical, axillary or  inguinal LUNGS: Noted reduced breath sound on the left lung base consistent with persistent mild pleural effusion  HEART: regular rate & rhythm and no murmurs with mild bilateral lower extremity edema ABDOMEN:abdomen soft, non-tender and normal bowel sounds Musculoskeletal:no cyanosis of digits and no clubbing  PSYCH: alert & oriented x 3 with fluent speech NEURO: no focal motor/sensory deficits  LABORATORY DATA:  I have reviewed the data as listed Lab Results  Component Value Date   WBC 11.1 (H) 09/26/2020   HGB 12.8 (L) 09/26/2020   HCT 40.5 09/26/2020   MCV 98.5 09/26/2020   PLT 349 09/26/2020    RADIOGRAPHIC STUDIES: I have personally reviewed the radiological images as listed and agreed with the findings in the report.  DG Chest 1 View  Result Date: 09/22/2020 CLINICAL DATA:  Left-sided thoracentesis today EXAM: CHEST  1 VIEW COMPARISON:  09/20/2020 FINDINGS: Marked reduction in size of the left pleural effusion. No significant pneumothorax. Mild atelectasis and potentially a small amount of residual effusion at the left lung base. Thoracic spondylosis. Borderline enlargement of the cardiopericardial silhouette. IMPRESSION: 1. Marked reduction in size of the left pleural effusion status post thoracentesis. No pneumothorax. 2. Borderline enlargement of the cardiopericardial silhouette. Electronically Signed   By: Gaylyn Rong M.D.   On: 09/22/2020 14:30   CT CHEST WO CONTRAST  Result Date: 09/23/2020 CLINICAL DATA:  Follow-up left-sided thoracentesis. Pleural effusion. Persistent cough. EXAM: CT CHEST WITHOUT CONTRAST TECHNIQUE: Multidetector CT imaging of the chest was performed following the standard protocol without IV contrast. COMPARISON:  08/27/2020 FINDINGS: Cardiovascular: Moderate size pericardial effusion has developed since the previous study. Extensive coronary artery calcification again evident. Aortic atherosclerotic calcification again evident. Small pulmonary  emboli seen previously cannot be visualized in the absence of contrast. Mediastinum/Nodes: No mediastinal or hilar mass or lymphadenopathy. Lungs/Pleura: Trace amount right pleural fluid layering dependently with minimal dependent atelectasis. Small left effusion layering dependently with mild dependent adjacent atelectasis. Non dependent aerated lung appears normal. Upper Abdomen: No acute or significant upper abdominal finding. Musculoskeletal: Ordinary thoracic degenerative changes. IMPRESSION: 1. Moderate size pericardial effusion has developed since the previous study. 2. Trace amount of right pleural fluid layering dependently with minimal dependent atelectasis. Small left effusion layering dependently with mild dependent atelectasis. 3. Small pulmonary emboli seen previously cannot be visualized in the absence of contrast. 4. Aortic atherosclerosis.  Coronary artery calcification. Aortic Atherosclerosis (ICD10-I70.0). Electronically Signed   By: Paulina Fusi M.D.   On: 09/23/2020 13:06   CARDIAC CATHETERIZATION  Addendum Date: 09/25/2020   LM: Normal LAD: Type 1 LAD that does not reach apex         Medial calcification without any luminal stenosis         Large Diag 1 that reaches apex and also gives off septal perforators, making it a dual LAD system         No other diagonals present LCx: Normal Ramus: Normal RCA: Large, gives of several septal perforators, reaches apex         Minimal luminal irregularities No significant coronary artery disease I discussed with the patient re: management of paroxysmal Afib, as well prior h/o atrial flutter. Cardioversion unlikely to help, as he has been in paroxysmal Afib. His EF dropped from normal to 25-30% while in Afib, making rhythm control therapy important. I discussed AAD vs ablation. He wants to avoid being on pills and would prefer consultation re: ablation. Will arrange this outpatient. Continue anticoagulation with eliquis and rate control with metoprolol  succinate 50 mg daily. Stop amiodarone as it has not been able to maintain sinus rhythm anyway. Stop diltiazem to avoid negative inotropy. From cardiac standpoint, okay to discharge on 11/17, if rate controlled to <110 bpm at rest. Will arrange outpatient f/u and EP referral. Elder Negus, MD Pager: [phone] Office: [phone]   Result Date: 09/25/2020 LM: Normal LAD: Type 1 LAD that does not reach apex         Medial calcification without any luminal stenosis         Large Diag 1 that reaches apex and also gives off septal perforators, making it a dual LAD system         No other diagonals present LCx: Normal Ramus: Normal  RCA: Large, gives of several septal perforators, reaches apex         Minimal luminal irregularities No significant coronary artery disease I discussed with the patient re: management of paroxysmal Afib, as well prior h/o atrial flutter. Cardioversion unlikely to help, as he has been in paroxysmal Afib. His EF dropped from normal to 25-30% while in Afib, making rhythm control therapy important. I discussed AAD vs ablation. He wants to avoid being on pills and would prefer consultation re: ablation. Will arrange this outpatient. Continue anticoagulation with eliquis and rate control with metoprolol succinate 50 mg daily. Stop amiodarone as it has not been able to maintain sinus rhythm anyway. Stop diltiazem to avoid negative inotropy. Elder Negus, MD Pager: [phone] Office: [phone]   DG Chest Port 1 View  Result Date: 09/20/2020 CLINICAL DATA:  Left pleural effusion status post thoracentesis EXAM: PORTABLE CHEST 1 VIEW COMPARISON:  09/19/2020 FINDINGS: Stable cardiomegaly. Slight interval decrease in left-sided pleural effusion, now moderate. Hazy left basilar opacity. Right lung remains clear. No pneumothorax. IMPRESSION: Interval decrease in left-sided pleural effusion, now moderate. No pneumothorax. Electronically Signed   By: Duanne Guess D.O.   On:  09/20/2020 08:29   DG Chest Port 1 View  Result Date: 09/19/2020 CLINICAL DATA:  Sepsis EXAM: PORTABLE CHEST 1 VIEW COMPARISON:  08/27/2020 FINDINGS: Cardiac silhouette appears enlarged, although is partially obscured by a moderate to large left-sided pleural effusion. Associated hazy left basilar opacity. Right lung is clear. No pneumothorax. IMPRESSION: 1. Moderate to large left-sided pleural effusion, new from prior. Associated hazy left basilar opacity may reflect atelectasis or pneumonia. 2. Cardiomegaly. An underlying pericardial effusion would be difficult to exclude. Electronically Signed   By: Duanne Guess D.O.   On: 09/19/2020 12:18   ECHOCARDIOGRAM LIMITED  Result Date: 09/25/2020    ECHOCARDIOGRAM LIMITED REPORT   Patient Name:   MACKEY VARRICCHIO Date of Exam: 09/21/2020 Medical Rec #:  [id number]     Height:       70.0 in Accession #:    [id number]    Weight:       232.3 lb Date of Birth:  December 10, 1955     BSA:          2.224 m Patient Age:    64 years      BP:           107/76 mmHg Patient Gender: M             HR:           84 bpm. Exam Location:  Inpatient Procedure: 2D Echo Indications:     atrial flutter. pulmonary embolism.  History:         Patient has prior history of Echocardiogram examinations, most                  recent 08/28/2020. CHF, Arrythmias:Atrial Fibrillation and                  Atrial Flutter; Risk Factors:Dyslipidemia.  Sonographer:     Delcie Roch Referring Phys:  [id number] HENID Diagnosing Phys: Orpah Cobb MD IMPRESSIONS  1. Left ventricular ejection fraction, by estimation, is 50%. The left ventricle has mildly decreased function. The left ventricle demonstrates global hypokinesis. Left ventricular diastolic parameters are indeterminate.  2. Right ventricular systolic function is normal. The right ventricular size is normal. The estimated right ventricular systolic pressure is 36.7 mmHg.  3. Left atrial size was mildly dilated.  4. The mitral  valve is  normal in structure. Trivial mitral valve regurgitation. No evidence of mitral stenosis.  5. The aortic valve is tricuspid. Aortic valve regurgitation is not visualized. Mild aortic valve sclerosis is present, with no evidence of aortic valve stenosis.  6. The inferior vena cava is dilated in size with <50% respiratory variability, suggesting right atrial pressure of 15 mmHg. FINDINGS  Left Ventricle: Left ventricular ejection fraction, by estimation, is 50%. The left ventricle has mildly decreased function. The left ventricle demonstrates global hypokinesis. The left ventricular internal cavity size was normal in size. There is no left ventricular hypertrophy. Left ventricular diastolic parameters are indeterminate. Right Ventricle: The right ventricular size is normal. No increase in right ventricular wall thickness. Right ventricular systolic function is normal. The tricuspid regurgitant velocity is 2.33 m/s, and with an assumed right atrial pressure of 15 mmHg, the estimated right ventricular systolic pressure is 36.7 mmHg. Left Atrium: Left atrial size was mildly dilated. Right Atrium: Right atrial size was normal in size. Pericardium: Trivial pericardial effusion is present. Mitral Valve: The mitral valve is normal in structure. Trivial mitral valve regurgitation. No evidence of mitral valve stenosis. Tricuspid Valve: The tricuspid valve is normal in structure. Tricuspid valve regurgitation is trivial. Aortic Valve: The aortic valve is tricuspid. Aortic valve regurgitation is not visualized. Mild aortic valve sclerosis is present, with no evidence of aortic valve stenosis. Aorta: The aortic root is normal in size and structure. Venous: The inferior vena cava is dilated in size with less than 50% respiratory variability, suggesting right atrial pressure of 15 mmHg. IAS/Shunts: No atrial level shunt detected by color flow Doppler. LEFT VENTRICLE PLAX 2D LVIDd:         5.50 cm Diastology LVIDs:         3.80 cm LV  e' medial:  9.46 cm/s LV PW:         1.10 cm LV e' lateral: 10.60 cm/s LV IVS:        0.90 cm  IVC IVC diam: 2.70 cm LEFT ATRIUM         Index LA diam:    4.60 cm 2.07 cm/m  TRICUSPID VALVE TR Peak grad:   21.7 mmHg TR Vmax:        233.00 cm/s  Read by Dr. Phineas Douglas Electronically Amended 09/21/2020, 1:57 PM   Final (Updating)    ECHOCARDIOGRAM LIMITED  Addendum Date: 09/24/2020   Correction: Low LVEF new since previous study on 09/21/2020. [email or web address] Electronically Amended 09/24/2020, 1:23 PM   Final [facility])    Result Date: 09/24/2020    ECHOCARDIOGRAM LIMITED REPORT   Patient Name:   TORRIS HOUSE Date of Exam: 09/24/2020 Medical Rec #:  [id number]     Height:       70.0 in Accession #:    [id number]    Weight:       215.2 lb Date of Birth:  07-Mar-1956     BSA:          2.153 m Patient Age:    64 years      BP:           108/88 mmHg Patient Gender: M             HR:           131 bpm. Exam Location:  Inpatient Procedure: Limited Echo, Limited Color Doppler and Cardiac Doppler Indications:     Pericardial effusion 423.9 / I31.3  History:  Patient has prior history of Echocardiogram examinations, most                  recent 09/21/2020. CHF, Arrythmias:Atrial Fibrillation and                  Atrial Flutter, Signs/Symptoms:Dyspnea; Risk                  Factors:Hypertension and Current Smoker. Pulmonary embolism.                  Pericardial effusion.  Sonographer:     Renella Cunas RDCS Referring Phys:  [id number] Tessa Lerner Diagnosing Phys: Truett Mainland MD IMPRESSIONS  1. Left ventricular ejection fraction, by estimation, is 20 to 25%. The left ventricle has severely decreased function. The left ventricle demonstrates global hypokinesis.  2. No significant pericardial effusion     Moderate left sided pleural effusion.  3. The inferior vena cava is normal in size with greater than 50% respiratory variability, suggesting right atrial pressure of 3 mmHg  4. No significant change compared to  previous study on 09/21/2020. FINDINGS  Left Ventricle: Left ventricular ejection fraction, by estimation, is 20 to 25%. The left ventricle has severely decreased function. The left ventricle demonstrates global hypokinesis. Pericardium: No significant pericardial effusion Moderate left sided pleural effusion. Venous: The inferior vena cava is normal in size with greater than 50% respiratory variability, suggesting right atrial pressure of 3 mmHg. Additional Comments: There is no pleural effusion in the left lateral region. LEFT VENTRICLE PLAX 2D LVIDd:         5.00 cm LVIDs:         4.40 cm LV PW:         0.90 cm LV IVS:        0.90 cm LVOT diam:     2.40 cm LVOT Area:     4.52 cm  LEFT ATRIUM         Index LA diam:    4.90 cm 2.28 cm/m   AORTA Ao Root diam: 3.50 cm  SHUNTS Systemic Diam: 2.40 cm Truett Mainland MD Electronically signed by Truett Mainland MD Signature Date/Time: 09/24/2020/1:21:57 PM    Final (Amended)    IR THORACENTESIS ASP PLEURAL SPACE W/IMG GUIDE  Result Date: 09/20/2020 INDICATION: Patient with history of HF, atrial fibrillation/flutter, dyspnea, and left pleural effusion. Request is made for diagnostic and therapeutic left thoracentesis. EXAM: ULTRASOUND GUIDED DIAGNOSTIC AND THERAPEUTIC LEFT THORACENTESIS MEDICATIONS: 10 mL 1% lidocaine COMPLICATIONS: None immediate. PROCEDURE: An ultrasound guided thoracentesis was thoroughly discussed with the patient and questions answered. The benefits, risks, alternatives and complications were also discussed. The patient understands and wishes to proceed with the procedure. Written consent was obtained. Ultrasound was performed to localize and mark an adequate pocket of fluid in the left chest. The area was then prepped and draped in the normal sterile fashion. 1% Lidocaine was used for local anesthesia. Under ultrasound guidance a 6 Fr Safe-T-Centesis catheter was introduced. Thoracentesis was performed. The catheter was removed and a  dressing applied. FINDINGS: A total of approximately 1.5 L of hazy amber fluid was removed. Procedure was stopped after 1.5 L secondary to patient's hypotension (73/63, 92/74) - catheter was removed and patient was laid back in bed with BP improvement (106/87). Patient remained asymptomatic throughout procedure (denied dizziness, vision changes). Samples were sent to the laboratory as requested by the clinical team. IMPRESSION: Successful ultrasound guided left thoracentesis yielding 1.5 L of pleural fluid. Read by:  Elwin Mocha, PA-C Electronically Signed   By: Marliss Coots MD   On: 09/20/2020 08:35   US THORACENTESIS ASP PLEURAL SPACE W/IMG GUIDE  Result Date: 09/23/2020 INDICATION: Diastolic heart failure with left pleural effusion. Request for diagnostic and therapeutic thoracentesis. EXAM: ULTRASOUND GUIDED LEFT THORACENTESIS MEDICATIONS: 1% lidocaine 10 mL COMPLICATIONS: None immediate. PROCEDURE: An ultrasound guided thoracentesis was thoroughly discussed with the patient and questions answered. The benefits, risks, alternatives and complications were also discussed. The patient understands and wishes to proceed with the procedure. Written consent was obtained. Ultrasound was performed to localize and mark an adequate pocket of fluid in the left chest. The area was then prepped and draped in the normal sterile fashion. 1% Lidocaine was used for local anesthesia. Under ultrasound guidance a 6 Fr Safe-T-Centesis catheter was introduced. Thoracentesis was performed. The catheter was removed and a dressing applied. FINDINGS: A total of approximately 650 mL of hazy peach colored fluid was removed. Samples were sent to the laboratory as requested by the clinical team. IMPRESSION: Successful ultrasound guided left thoracentesis yielding 650 mL of pleural fluid. No pneumothorax on post-procedure chest x-ray. Read by: Corrin Parker, PA-C Electronically Signed   By: Irish Lack M.D.   On: 09/23/2020 11:20     ASSESSMENT & PLAN:  Acute pulmonary embolism (HCC) I reviewed with the patient about the plan for care for acute unprovoked PE.  This last episode of blood clot appeared to be unprovoked, but could be contributed by dehydration due to his excessive alcohol use and his age.  The abnormal thrombophilia panel was done in the setting of recent blood clot and is not consider diagnostic.  I do not plan to repeat this as it would not change management We discussed about various options of anticoagulation therapies including warfarin, low molecular weight heparin such as Lovenox or newer agents such as Rivaroxaban. Some of the risks and benefits discussed including costs involved, the need for monitoring, risks of life-threatening bleeding/hospitalization, reversibility of each agent in the event of bleeding or overdose, safety profile of each drug and taking into account other social issues such as ease of administration of medications, etc. 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 CT CHEST W CONTRAST    Standing Status:   Future    Standing Expiration Date:   10/01/2021    Order Specific Question:   If indicated for the ordered procedure, I authorize the administration of contrast media per Radiology protocol    Answer:   Yes    Order Specific Question:   Preferred imaging location?    Answer:   [facility]    Order Specific Question:   Radiology Contrast Protocol - do NOT remove file path    Answer:   [email or web address]  . CBC with Differential/Platelet    Standing Status:   Standing    Number of Occurrences:   22    Standing Expiration Date:   10/01/2021  . Comprehensive metabolic panel    Standing Status:   Standing    Number of Occurrences:   33    Standing Expiration Date:   10/01/2021    All questions were answered. The patient knows to call the clinic with any problems, questions or concerns. The total time spent in the appointment was 60 minutes encounter with patients including review of chart and various tests results, discussions about plan of care and coordination of care plan  Artis Delay, MD 11/22/20213:58 PM

## 2020-10-08 ENCOUNTER — Encounter: Payer: Self-pay | Admitting: Cardiology

## 2020-10-08 ENCOUNTER — Ambulatory Visit: Payer: Federal, State, Local not specified - PPO | Admitting: Student

## 2020-10-22 NOTE — Progress Notes (Signed)
Primary Physician/Referring:  Roger Kill, PA-C  Patient ID: Carl [name], male    DOB: 1956/10/29, 64 y.o.   MRN: [id number]  Chief Complaint  Patient presents with  . Typical atrial flutter   . Follow-up   HPI:    Carl [name]  is a 64 y.o. male with past medical history of hypertension, hyperlipidemia, tobacco use disorder, who presented to the emergency department 08/27/2020 in typical atrial flutter with RVR.  He underwent successful direct-current cardioversion.  Patient was then admitted to the hospital again 09/19/2020-09/26/2020 with pneumonia, pulmonary embolism, atrial flutter, and systolic heart failure.   Patient presents for hospital follow-up after admission 09/19/2020-09/26/2020.  Patient is presently feeling well without complaints of chest pain, palpitations, dyspnea.  Denies leg swelling, orthopnea, PND, syncope, near syncope, dizziness.  He is presently walking every other day and slowly increasing his activity without issue.  On days when he does not walk he is active around his house, which she is presently renovating.  Patient is focused on making improvements to his diet and losing weight.  He is presently abstaining from alcohol.  He is currently tolerating Eliquis without bleeding diathesis.  Patient does have concerns regarding heart rate.  Patient wears a Fitbit which constantly monitors his heart rate and he noticed an episode of elevated heart rate up to 150 bpm several days ago lasting 1-2 hours.  During this episode patient was asymptomatic.  Recommendation per Dr. Rosemary Holms 09/25/2020 post cath report:  Discussed with the patient re: management of paroxysmal Afib, as well prior h/o atrial flutter. Cardioversion unlikely to help, as he has been in paroxysmal Afib. His EF dropped from normal to 25-30% while in Afib, making rhythm control therapy important. I discussed AAD vs ablation. He wants to avoid being on pills and would prefer consultation re:  ablation. Will arrange this outpatient. Continue anticoagulation with eliquis and rate control with metoprolol succinate 50 mg daily.   Past Medical History:  Diagnosis Date  . Hypercholesteremia   . Primary hypertension   . Pulmonary embolism (HCC) 08/27/2020  . Typical atrial flutter (HCC) 08/27/2020 with PE   Past Surgical History:  Procedure Laterality Date  . IR THORACENTESIS ASP PLEURAL SPACE W/IMG GUIDE  09/20/2020  . LAPAROTOMY    . LEFT HEART CATH AND CORONARY ANGIOGRAPHY N/A 09/25/2020   Procedure: LEFT HEART CATH AND CORONARY ANGIOGRAPHY;  Surgeon: Elder Negus, MD;  Location: [facility];  Service: Cardiovascular;  Laterality: N/A;   Family History  Problem Relation Age of Onset  . CAD Mother 52  . Heart attack Father   . Heart disease Brother   . Heart failure Brother   . Heart attack Brother   . Cancer Sister        lung    Social History   Tobacco Use  . Smoking status: Former Smoker    Packs/day: 0.25    Years: 15.00    Pack years: 3.75    Types: Cigars    Quit date: 08/23/2020    Years since quitting: 0.1  . Smokeless tobacco: Never Used  . Tobacco comment: Once a week  Substance Use Topics  . Alcohol use: Not Currently    Alcohol/week: 1.0 standard drink    Types: 1 Glasses of wine per week    Comment: Occasional   Marital Status: Widowed   ROS  Review of Systems  Constitutional: Negative for malaise/fatigue and weight gain.  Cardiovascular: Negative for chest pain, claudication, dyspnea on  exertion, leg swelling, near-syncope, orthopnea, palpitations, paroxysmal nocturnal dyspnea and syncope.  Respiratory: Negative for shortness of breath.   Hematologic/Lymphatic: Does not bruise/bleed easily.  Gastrointestinal: Negative for melena.  Neurological: Negative for dizziness and weakness.    Objective  Blood pressure 115/75, pulse (!) 103, resp. rate 16, height  (1.778 m), weight 220 lb (99.8 kg), SpO2 93 %.  Vitals with BMI  10/23/2020 10/01/2020 09/26/2020  Height   -  Weight 220 lbs 220 lbs 6 oz 217 lbs 3 oz  BMI 31.57 31.62 31.16  Systolic 115 117 161  Diastolic 75 78 68  Pulse 103 95 -      Physical Exam Vitals reviewed.  HENT:     Head: Normocephalic and atraumatic.  Cardiovascular:     Rate and Rhythm: Normal rate and regular rhythm.     Pulses: Intact distal pulses.     Heart sounds: S1 normal and S2 normal. No murmur heard. No gallop.      Comments: Trace bilateral LE edema. No JVD.  Pulmonary:     Effort: Pulmonary effort is normal. No respiratory distress.     Breath sounds: No wheezing, rhonchi or rales.  Skin:    General: Skin is warm and dry.  Neurological:     Mental Status: He is alert.    Laboratory examination:   Recent Labs    09/24/20 0452 09/25/20 0506 09/26/20 0536  NA 134* 136 137  K 3.1* 4.0 3.9  CL 91* 95* 99  CO2 34* 32 33*  GLUCOSE 117* 116* 89  BUN CREATININE 0.97 0.94 0.88  CALCIUM 9.0 9.0 8.7*  GFRNONAA >60 >60 >60   CrCl cannot be calculated (Patient's most recent lab result is older than the maximum 21 days allowed.).  CMP Latest Ref Rng & Units 09/26/2020 09/25/2020 09/24/2020  Glucose 70 - 99 mg/dL 89 096(E) 454(U)  BUN 8 - 23 mg/dL Creatinine 0.61 - 1.24 mg/dL 9.81 1.91 4.78  Sodium 135 - 145 mmol/L 137 136 134(L)  Potassium 3.5 - 5.1 mmol/L 3.9 4.0 3.1(L)  Chloride 98 - 111 mmol/L 99 95(L) 91(L)  CO2 22 - 32 mmol/L 33(H) 32 34(H)  Calcium 8.9 - 10.3 mg/dL 2.9(F) 9.0 9.0  Total Protein 6.5 - 8.1 g/dL - - -  Total Bilirubin 0.3 - 1.2 mg/dL - - -  Alkaline Phos 38 - 126 U/L - - -  AST 15 - 41 U/L - - -  ALT 0 - 44 U/L - - -   CBC Latest Ref Rng & Units 09/26/2020 09/23/2020 09/20/2020  WBC 4.0 - 10.5 K/uL 11.1(H) 10.7(H) 12.6(H)  Hemoglobin 13.0 - 17.0 g/dL 12.8(L) 12.6(L) 12.3(L)  Hematocrit 39.0 - 52.0 % 40.5 38.3(L) 39.5  Platelets 150 - 400 K/uL 349 369 333    Lipid Panel No results for input(s): CHOL,  TRIG, LDLCALC, VLDL, HDL, CHOLHDL, LDLDIRECT in the last 8760 hours.  HEMOGLOBIN A1C No results found for: HGBA1C, MPG TSH Recent Labs    08/27/20 1430 09/21/20 0441  TSH 0.457 2.577    External labs:  02/17/2020 LDL 145, total cholesterol 229, triglycerides 136, HDL 62, non-HDL 167  Medications and allergies  No Known Allergies   Outpatient Medications Prior to Visit  Medication Sig Dispense Refill  . allopurinol (ZYLOPRIM) 100 MG tablet Take 100 mg by mouth in the morning.     Marland Kitchen apixaban (ELIQUIS) 5 MG TABS tablet Take 1 tablet (5 mg  total) by mouth 2 (two) times daily.    Marland Kitchen aspirin EC 81 MG EC tablet Take 1 tablet (81 mg total) by mouth daily. Swallow whole. 30 tablet 11  . atorvastatin (LIPITOR) 10 MG tablet Take 10 mg by mouth daily.    . Lactobacillus Rhamnosus, GG, (RA PROBIOTIC DIGESTIVE CARE) CAPS Take 1 capsule by mouth in the morning.    . levalbuterol (XOPENEX HFA) 45 MCG/ACT inhaler Inhale 1 puff into the lungs every 4 (four) hours as needed for wheezing. 1 each 2  . metoprolol succinate (TOPROL-XL) 50 MG 24 hr tablet Take 1 tablet (50 mg total) by mouth daily. Take with or immediately following a meal. 30 tablet 1  . spironolactone (ALDACTONE) 25 MG tablet Take 1 tablet (25 mg total) by mouth daily. 30 tablet 1  . tadalafil (CIALIS) 20 MG tablet Take 20 mg by mouth daily as needed for erectile dysfunction.      No facility-administered medications prior to visit.     Radiology:   No results found.   Chest x-ray 09/22/2020: 1. Marked reduction in size of the left pleural effusion status post thoracentesis. No pneumothorax. 2. Borderline enlargement of the cardiopericardial silhouette.  CT chest 09/23/2020: 1. Moderate size pericardial effusion has developed since the previous study. 2. Trace amount of right pleural fluid layering dependently with minimal dependent atelectasis. Small left effusion layering dependently with mild dependent atelectasis. 3. Small  pulmonary emboli seen previously cannot be visualized in the absence of contrast. 4. Aortic atherosclerosis.  Coronary artery calcification. Aortic Atherosclerosis (ICD10-I70.0).  Cardiac Studies:   Lower Extremity Venous Duplex  08/28/20   +---------+---------------+---------+-----------+----------+--------------+     Summary: BILATERAL: - No evidence of deep vein thrombosis seen in the lower extremities, bilaterally. - No evidence of superficial venous thrombosis in the lower extremities, bilaterally. -   *See table(s) above for measurements and observations.    Preliminary    CT Angio Chest PE W and/or Wo Contrast.  Date: 08/27/2020 CLINICAL DATA:  Shortness of breath and chest pain  FINDINGS: Cardiovascular: Satisfactory opacification of the pulmonary arteries to the segmental level. There are filling defects in segmental branches of bilateral upper and right middle lobes. Additional suspected filling defects within subsegmental branches. Left atrial enlargement. No evidence of right heart strain. No pericardial effusion. Mediastinum/Nodes: There are no enlarged lymph nodes identified. Thyroid and esophagus are unremarkable. Lungs/Pleura: Patchy areas of atelectasis. No pleural effusion or pneumothorax. Upper Abdomen: No acute abnormality. Musculoskeletal: No acute osseous abnormality. Review of the MIP images confirms the above findings.  IMPRESSION: Acute segmental and subsegmental pulmonary emboli bilaterally. No evidence of right heart strain. Patchy bilateral atelectasis.  Echocardiogram 08/28/2020:  1. Left ventricular ejection fraction, by estimation, is 50 to 55%. The left ventricle has low normal function. The left ventricle has no regional wall motion abnormalities. Left ventricular diastolic parameters were normal.  2. Right ventricular systolic function is normal. The right ventricular size is normal. There is moderately elevated pulmonary artery systolic pressure. The estimated  right ventricular systolic pressure is 40.6 mmHg.  3. The mitral valve is normal in structure. Trivial mitral valve regurgitation. No evidence of mitral stenosis.  4. The aortic valve is normal in structure. Aortic valve regurgitation is not visualized.  5. The inferior vena cava is dilated in size with <50% respiratory variability, suggesting right atrial pressure of 15 mmHg.  Echocardiogram 09/21/2020 1. Left ventricular ejection fraction, by estimation, is 50%. The left ventricle has mildly decreased function. The left ventricle  demonstrates global hypokinesis. Left ventricular diastolic parameters are indeterminate.  2. Right ventricular systolic function is normal. The right ventricular size is normal. The estimated right ventricular systolic pressure is 36.7 mmHg.  3. Left atrial size was mildly dilated.  4. The mitral valve is normal in structure. Trivial mitral valve regurgitation. No evidence of mitral stenosis.  5. The aortic valve is tricuspid. Aortic valve regurgitation is not visualized. Mild aortic valve sclerosis is present, with no evidence of aortic valve stenosis.  6. The inferior vena cava is dilated in size with <50% respiratory variability, suggesting right atrial pressure of 15 mmHg.   Echocardiogram 09/24/2020 1. Left ventricular ejection fraction, by estimation, is 20 to 25%. The left ventricle has severely decreased function. The left ventricle demonstrates global hypokinesis.  2. No significant pericardial effusion  Moderate left sided pleural effusion.  3. The inferior vena cava is normal in size with greater than 50% respiratory variability, suggesting right atrial pressure of 3 mmHg  4. No significant change compared to previous study on 09/21/2020.   Coronary angiography 09/25/2020 LM: Normal LAD: Type 1 LAD that does not reach apex          Medial calcification without any luminal stenosis         Large Diag 1 that reaches apex and also gives off  septal perforators, making it a dual LAD system          No other diagonals present LCx: Normal Ramus: Normal RCA: Large, gives of several septal perforators, reaches apex         Minimal luminal irregularities No significant coronary artery disease   EKG:   EKG 10/23/2020: Sinus rhythm at a rate of 100 bpm.  Left atrial enlargement, left anterior fascicular block.  Poor R wave progression, cannot exclude anteroseptal infarct old.  Nonspecific T wave abnormality.  EKG 09/17/2020: Typical atrial flutter with 2: conduction and ventricular rate of 160 bpm.  Normal axis.  Incomplete right bundle branch block.  Nonspecific T wave abnormality.  EKG 08/28/2020: Sinus tachycardia at rate of 100 bpm, left axis deviation, left intrafascicular block.  Early R wave transition in V2, probably lead placement but cannot exclude RVH.  Nonspecific T abnormality.  Compared to EKG done 2020, diffuse inferior and lateral T wave inversions suggestive of ischemia are no longer present.  No change in V2 R wave progression.  Abnormal EKG.  Assessment     ICD-10-CM   1. Paroxysmal atrial fibrillation (HCC)  I48.0 EKG 12-Lead  2. Chronic systolic heart failure (HCC)  Z61.09I50.22 PCV ECHOCARDIOGRAM COMPLETE    Brain natriuretic peptide    CBC    Lipid Panel With LDL/HDL Ratio  3. Essential hypertension  I10 Brain natriuretic peptide  4. Other acute pulmonary embolism without acute cor pulmonale (HCC)  I26.99   5. Long term (current) use of anticoagulants  Z79.01 CBC  6. Hypercholesteremia  E78.00 Lipid Panel With LDL/HDL Ratio     There are no discontinued medications.  No orders of the defined types were placed in this encounter.  This patients CHA2DS2-VASc Score 2 (HTN, CHF) and yearly risk of stroke 2.2%.   Recommendations:   Carl [name] is a 64 y.o.  male with past medical history of hypertension, hyperlipidemia, tobacco use disorder, who presented to the emergency department 08/27/2020 in typical atrial  flutter with RVR.  He underwent successful direct-current cardioversion.  Patient was then admitted to the hospital again 09/19/2020-09/26/2020 with pneumonia, pulmonary embolism, atrial flutter, and systolic heart  failure.  Patient presents for hospital follow-up presently feeling well.  Suspect the episode of elevated heart rate noted on patient's Fitbit monitor was likely atrial fibrillation.  Educated patient regarding the natural course of atrial fibrillation including occasional episodes of arrhythmia, patient verbalized understanding. EKG today revealed patient in sinus rhythm with controlled ventricular response.  However his heart rate is borderline, and in view of reduced ejection fraction would prefer heart rate less than 70 bpm.  Also discussed with patient regarding atrial fibrillation/flutter induced heart failure and the importance of rhythm control therapy in the setting of reduced ejection fraction.  However patient is hesitant to take medicines, as he prefers to avoid taking many medications and he is feeling well.  Discussed with patient that I recommend up titration of beta-blocker or addition of calcium channel blocker for improved heart rate control.  Also advised patient that I would recommend rhythm control therapy by either antiarrhythmic medication or referral to electrophysiology for evaluation for ablation procedure.  Educated patient regarding the risk of worsening heart failure if he continues to have episodes of atrial fibrillation, especially if he were to have rapid ventricular response.  Despite discussion of the risks and benefits, patient continues to prefer not to make changes to or add to his medications at this time.  He also wishes not to undergo evaluation by electrophysiology at this time.  By joint decision making patient agreed to undergo repeat echocardiogram in 1 month with follow-up appointment to further evaluate rhythm and improve rate control options.  In the  meantime patient will continue to monitor his heart rate closely with his Fitbit and will find the office if he continues to act episodes of high heart rate or if he experiences symptoms including chest pain, dyspnea, dizziness, orthopnea, PND, leg swelling.  Patient is tolerating anticoagulation without bleeding diathesis.  At this time patient will continue present cardiovascular medications including Eliquis, aspirin, atorvastatin, metoprolol succinate, spironolactone.  Will obtain BMP, CBC, and lipid profile testing.  Patient will continue to follow with hematology regarding pulmonary embolism. Blood pressure is well controlled.   Follow-up in 1 month for heart failure and atrial fibrillation, after echocardiogram.   This was a 50-minute encounter with face-to-face counseling, medical records review, coordination of care, explanation of complex medical issues, complex medical decision making, addressing patient questions and concerns.     Rayford Halsted, PA-C 10/23/2020, 4:20 PM Office: [phone]

## 2020-10-23 ENCOUNTER — Ambulatory Visit: Payer: Federal, State, Local not specified - PPO | Admitting: Student

## 2020-10-23 ENCOUNTER — Other Ambulatory Visit: Payer: Self-pay

## 2020-10-23 ENCOUNTER — Encounter: Payer: Self-pay | Admitting: Student

## 2020-10-23 VITALS — BP 115/75 | HR 103 | Resp 16 | Ht 70.0 in | Wt 220.0 lb

## 2020-10-23 DIAGNOSIS — E7800 Pure hypercholesterolemia, unspecified: Secondary | ICD-10-CM

## 2020-10-23 DIAGNOSIS — I483 Typical atrial flutter: Secondary | ICD-10-CM | POA: Diagnosis not present

## 2020-10-23 DIAGNOSIS — I10 Essential (primary) hypertension: Secondary | ICD-10-CM

## 2020-10-23 DIAGNOSIS — I480 Paroxysmal atrial fibrillation: Secondary | ICD-10-CM

## 2020-10-23 DIAGNOSIS — Z7901 Long term (current) use of anticoagulants: Secondary | ICD-10-CM

## 2020-10-23 DIAGNOSIS — I5022 Chronic systolic (congestive) heart failure: Secondary | ICD-10-CM

## 2020-10-23 DIAGNOSIS — I2699 Other pulmonary embolism without acute cor pulmonale: Secondary | ICD-10-CM

## 2020-11-20 ENCOUNTER — Other Ambulatory Visit: Payer: Federal, State, Local not specified - PPO

## 2020-11-22 ENCOUNTER — Ambulatory Visit: Payer: Federal, State, Local not specified - PPO

## 2020-11-22 ENCOUNTER — Other Ambulatory Visit: Payer: Self-pay

## 2020-11-22 DIAGNOSIS — I5022 Chronic systolic (congestive) heart failure: Secondary | ICD-10-CM

## 2020-11-25 NOTE — Progress Notes (Signed)
Primary Physician/Referring:  Roger Kill, PA-C  Patient ID: Carl [name], male    DOB: Mar 24, 1956, 65 y.o.   MRN: [id number]  I connected with the patient on 11/27/20 by a telephone call and verified that I am speaking with the correct person using two identifiers.     I offered the patient a video enabled application for a virtual visit. Unfortunately, this could not be accomplished due to technical difficulties/lack of video enabled phone/computer. I discussed the limitations of evaluation and management by telemedicine and the availability of in person appointments. The patient expressed understanding and agreed to proceed.   This visit type was conducted due to national recommendations for restrictions regarding the COVID-19 Pandemic (e.g. social distancing).  This format is felt to be most appropriate for this patient at this time.  All issues noted in this document were discussed and addressed.  No physical exam was performed (except for noted visual exam findings with Tele health visits).  The patient has consented to conduct a Tele health visit and understands insurance will be billed.   Chief Complaint  Patient presents with   Atrial Fibrillation   Congestive Heart Failure   Follow-up   HPI:    Carl [name]  is a 65 y.o. male with past medical history of hypertension, hyperlipidemia, tobacco use disorder, who presented to the emergency department 08/27/2020 in typical atrial flutter with RVR.  He underwent successful direct-current cardioversion.  Patient was then admitted to the hospital again 09/19/2020-09/26/2020 with pneumonia, pulmonary embolism, atrial flutter, and systolic heart failure.   Patient presents for 1 month follow up for heart failure and atrial fibrillation. Patient is presently doing well. He has had no known recurrence of atrial fibrillation since last visit, although in the past he has been asymptomatic during episodes of high heart rate noted at  home suggestive of atrial fibrillation. Patient continues to monitor his heart rate regularly with Ftibit, he has had no recurrence of elevated resting heart rate. States resting heart rate is 80-90 bpm at home over the last 2 weeks.   Denies leg swelling, orthopnea, PND, syncope, near syncope, dizziness. He continues to be active on a daily basis without issue.  He is currently tolerating Eliquis without bleeding diathesis.  Past Medical History:  Diagnosis Date   Hypercholesteremia    Primary hypertension    Pulmonary embolism (HCC) 08/27/2020   Typical atrial flutter (HCC) 08/27/2020 with PE   Past Surgical History:  Procedure Laterality Date   IR THORACENTESIS ASP PLEURAL SPACE W/IMG GUIDE  09/20/2020   LAPAROTOMY     LEFT HEART CATH AND CORONARY ANGIOGRAPHY N/A 09/25/2020   Procedure: LEFT HEART CATH AND CORONARY ANGIOGRAPHY;  Surgeon: Elder Negus, MD;  Location: [facility];  Service: Cardiovascular;  Laterality: N/A;   Family History  Problem Relation Age of Onset   CAD Mother 68   Heart attack Father    Heart disease Brother    Heart failure Brother    Heart attack Brother    Cancer Sister        lung    Social History   Tobacco Use   Smoking status: Former Smoker    Packs/day: 0.25    Years: 15.00    Pack years: 3.75    Types: Cigars    Quit date: 08/23/2020    Years since quitting: 0.2   Smokeless tobacco: Never Used   Tobacco comment: Once a week  Substance Use Topics   Alcohol use:  Not Currently    Alcohol/week: 1.0 standard drink    Types: 1 Glasses of wine per week    Comment: Occasional   Marital Status: Widowed   ROS  Review of Systems  Constitutional: Negative for malaise/fatigue and weight gain.  Cardiovascular: Negative for chest pain, claudication, dyspnea on exertion, leg swelling, near-syncope, orthopnea, palpitations, paroxysmal nocturnal dyspnea and syncope.  Respiratory: Negative for shortness of breath.    Hematologic/Lymphatic: Does not bruise/bleed easily.  Gastrointestinal: Negative for melena.  Neurological: Negative for dizziness and weakness.    Objective  Blood pressure 140/87, pulse 85, SpO2 97 %.  Vitals with BMI 11/27/2020 10/23/2020 10/01/2020  Height - 5\' 10"  5\' 10"   Weight - 220 lbs 220 lbs 6 oz  BMI - 31.57 31.62  Systolic 140 115 [id number]  Diastolic 87 75 78  Pulse 85 103 95    Vitals for today's encounter were taken by patient using home monitoring devices.  Due to limitations of telehealth visit, unable to perform physical exam today.  Physical exam from last office visit on 10/23/2020:  Physical Exam Vitals reviewed.  HENT:     Head: Normocephalic and atraumatic.  Cardiovascular:     Rate and Rhythm: Normal rate and regular rhythm.     Pulses: Intact distal pulses.     Heart sounds: S1 normal and S2 normal. No murmur heard. No gallop.      Comments: Trace bilateral LE edema. No JVD.  Pulmonary:     Effort: Pulmonary effort is normal. No respiratory distress.     Breath sounds: No wheezing, rhonchi or rales.  Skin:    General: Skin is warm and dry.  Neurological:     Mental Status: He is alert.    Laboratory examination:   Recent Labs    09/24/20 0452 09/25/20 0506 09/26/20 0536  NA 134* 136 137  K 3.1* 4.0 3.9  CL 91* 95* 99  CO2 34* 32 33*  GLUCOSE 117* 116* 89  BUN 16 14 11   CREATININE 0.97 0.94 0.88  CALCIUM 9.0 9.0 8.7*  GFRNONAA >60 >60 >60   CrCl cannot be calculated (Patient's most recent lab result is older than the maximum 21 days allowed.).  CMP Latest Ref Rng & Units 09/26/2020 09/25/2020 09/24/2020  Glucose 70 - 99 mg/dL 89 096(E116(H) 454(U117(H)  BUN 8 - 23 mg/dL 11 14 16   Creatinine 0.61 - 1.24 mg/dL 9.810.88 1.910.94 4.780.97  Sodium 135 - 145 mmol/L 137 136 134(L)  Potassium 3.5 - 5.1 mmol/L 3.9 4.0 3.1(L)  Chloride 98 - 111 mmol/L 99 95(L) 91(L)  CO2 22 - 32 mmol/L 33(H) 32 34(H)  Calcium 8.9 - 10.3 mg/dL 2.9(F8.7(L) 9.0 9.0  Total Protein 6.5 -  8.1 g/dL - - -  Total Bilirubin 0.3 - 1.2 mg/dL - - -  Alkaline Phos 38 - 126 U/L - - -  AST 15 - 41 U/L - - -  ALT 0 - 44 U/L - - -   CBC Latest Ref Rng & Units 09/26/2020 09/23/2020 09/20/2020  WBC 4.0 - 10.5 K/uL 11.1(H) 10.7(H) 12.6(H)  Hemoglobin 13.0 - 17.0 g/dL 12.8(L) 12.6(L) 12.3(L)  Hematocrit 39.0 - 52.0 % 40.5 38.3(L) 39.5  Platelets 150 - 400 K/uL 349 369 333    Lipid Panel No results for input(s): CHOL, TRIG, LDLCALC, VLDL, HDL, CHOLHDL, LDLDIRECT in the last 8760 hours.  HEMOGLOBIN A1C No results found for: HGBA1C, MPG TSH Recent Labs    08/27/20 1430 09/21/20 0441  TSH 0.457 2.577  External labs:  02/17/2020 LDL 145, total cholesterol 229, triglycerides 136, HDL 62, non-HDL 167  Medications and allergies  No Known Allergies   Outpatient Medications Prior to Visit  Medication Sig Dispense Refill   allopurinol (ZYLOPRIM) 100 MG tablet Take 100 mg by mouth in the morning.      apixaban (ELIQUIS) 5 MG TABS tablet Take 1 tablet (5 mg total) by mouth 2 (two) times daily.     aspirin EC 81 MG EC tablet Take 1 tablet (81 mg total) by mouth daily. Swallow whole. 30 tablet 11   atorvastatin (LIPITOR) 10 MG tablet Take 10 mg by mouth daily.     Lactobacillus Rhamnosus, GG, (RA PROBIOTIC DIGESTIVE CARE) CAPS Take 1 capsule by mouth in the morning.     tadalafil (CIALIS) 20 MG tablet Take 20 mg by mouth daily as needed for erectile dysfunction.      levalbuterol (XOPENEX HFA) 45 MCG/ACT inhaler Inhale 1 puff into the lungs every 4 (four) hours as needed for wheezing. 1 each 2   metoprolol succinate (TOPROL-XL) 50 MG 24 hr tablet Take 1 tablet (50 mg total) by mouth daily. Take with or immediately following a meal. 30 tablet 1   spironolactone (ALDACTONE) 25 MG tablet Take 1 tablet (25 mg total) by mouth daily. 30 tablet 1   No facility-administered medications prior to visit.     Radiology:   No results found.   Chest x-ray 09/22/2020: 1. Marked  reduction in size of the left pleural effusion status post thoracentesis. No pneumothorax. 2. Borderline enlargement of the cardiopericardial silhouette.  CT chest 09/23/2020: 1. Moderate size pericardial effusion has developed since the previous study. 2. Trace amount of right pleural fluid layering dependently with minimal dependent atelectasis. Small left effusion layering dependently with mild dependent atelectasis. 3. Small pulmonary emboli seen previously cannot be visualized in the absence of contrast. 4. Aortic atherosclerosis.  Coronary artery calcification. Aortic Atherosclerosis (ICD10-I70.0).  Cardiac Studies:   Lower Extremity Venous Duplex  08/28/20   +---------+---------------+---------+-----------+----------+--------------+     Summary: BILATERAL: - No evidence of deep vein thrombosis seen in the lower extremities, bilaterally. - No evidence of superficial venous thrombosis in the lower extremities, bilaterally. -   *See table(s) above for measurements and observations.    Preliminary    CT Angio Chest PE W and/or Wo Contrast.  Date: 08/27/2020 CLINICAL DATA:  Shortness of breath and chest pain  FINDINGS: Cardiovascular: Satisfactory opacification of the pulmonary arteries to the segmental level. There are filling defects in segmental branches of bilateral upper and right middle lobes. Additional suspected filling defects within subsegmental branches. Left atrial enlargement. No evidence of right heart strain. No pericardial effusion. Mediastinum/Nodes: There are no enlarged lymph nodes identified. Thyroid and esophagus are unremarkable. Lungs/Pleura: Patchy areas of atelectasis. No pleural effusion or pneumothorax. Upper Abdomen: No acute abnormality. Musculoskeletal: No acute osseous abnormality. Review of the MIP images confirms the above findings.  IMPRESSION: Acute segmental and subsegmental pulmonary emboli bilaterally. No evidence of right heart strain. Patchy bilateral  atelectasis.  Echocardiogram 08/28/2020:  1. Left ventricular ejection fraction, by estimation, is 50 to 55%. The left ventricle has low normal function. The left ventricle has no regional wall motion abnormalities. Left ventricular diastolic parameters were normal.  2. Right ventricular systolic function is normal. The right ventricular size is normal. There is moderately elevated pulmonary artery systolic pressure. The estimated right ventricular systolic pressure is 40.6 mmHg.  3. The mitral valve is normal in structure. Trivial  mitral valve regurgitation. No evidence of mitral stenosis.  4. The aortic valve is normal in structure. Aortic valve regurgitation is not visualized.  5. The inferior vena cava is dilated in size with <50% respiratory variability, suggesting right atrial pressure of 15 mmHg.  Echocardiogram 09/21/2020 1. Left ventricular ejection fraction, by estimation, is 50%. The left ventricle has mildly decreased function. The left ventricle demonstrates global hypokinesis. Left ventricular diastolic parameters are indeterminate.  2. Right ventricular systolic function is normal. The right ventricular size is normal. The estimated right ventricular systolic pressure is 36.7 mmHg.  3. Left atrial size was mildly dilated.  4. The mitral valve is normal in structure. Trivial mitral valve regurgitation. No evidence of mitral stenosis.  5. The aortic valve is tricuspid. Aortic valve regurgitation is not visualized. Mild aortic valve sclerosis is present, with no evidence of aortic valve stenosis.  6. The inferior vena cava is dilated in size with <50% respiratory variability, suggesting right atrial pressure of 15 mmHg.   Echocardiogram 09/24/2020 1. Left ventricular ejection fraction, by estimation, is 20 to 25%. The left ventricle has severely decreased function. The left ventricle demonstrates global hypokinesis.  2. No significant pericardial effusion  Moderate left  sided pleural effusion.  3. The inferior vena cava is normal in size with greater than 50% respiratory variability, suggesting right atrial pressure of 3 mmHg  4. No significant change compared to previous study on 09/21/2020.   Coronary angiography 09/25/2020 LM: Normal LAD: Type 1 LAD that does not reach apex          Medial calcification without any luminal stenosis         Large Diag 1 that reaches apex and also gives off septal perforators, making it a dual LAD system          No other diagonals present LCx: Normal Ramus: Normal RCA: Large, gives of several septal perforators, reaches apex         Minimal luminal irregularities No significant coronary artery disease  PCV ECHOCARDIOGRAM COMPLETE 11/22/2020 Normal LV systolic function with EF 66%. Left ventricle cavity is normal in size. Normal global wall motion. Normal diastolic filling pattern. Calculated EF 66%. Left atrial cavity is mildly dilated at 4.8 cm. Structurally normal tricuspid valve. No evidence of tricuspid stenosis. Mild tricuspid regurgitation. No evidence of pulmonary hypertension. Compared to the study done on 09/24/2020, ejection fraction has improved to normal. Compared to prior echocardiogram on 08/28/2020 where her EF was 50 to 55% with moderate pulmonary hypertension, pulm hypertension no longer present.   EKG:   EKG 10/23/2020: Sinus rhythm at a rate of 100 bpm.  Left atrial enlargement, left anterior fascicular block.  Poor R wave progression, cannot exclude anteroseptal infarct old.  Nonspecific T wave abnormality.  EKG 09/17/2020: Typical atrial flutter with 2: conduction and ventricular rate of 160 bpm.  Normal axis.  Incomplete right bundle branch block.  Nonspecific T wave abnormality.  EKG 08/28/2020: Sinus tachycardia at rate of 100 bpm, left axis deviation, left intrafascicular block.  Early R wave transition in V2, probably lead placement but cannot exclude RVH.  Nonspecific T abnormality.   Compared to EKG done 2020, diffuse inferior and lateral T wave inversions suggestive of ischemia are no longer present.  No change in V2 R wave progression.  Abnormal EKG.  Assessment     ICD-10-CM   1. Paroxysmal atrial fibrillation (HCC)  I48.0 metoprolol succinate (TOPROL-XL) 50 MG 24 hr tablet  2. Chronic systolic heart failure (HCC)  I50.22 metoprolol succinate (TOPROL-XL) 50 MG 24 hr tablet    spironolactone (ALDACTONE) 25 MG tablet  3. Essential hypertension  I10 spironolactone (ALDACTONE) 25 MG tablet  4. Long term (current) use of anticoagulants  Z79.01   5. Other acute pulmonary embolism without acute cor pulmonale (HCC)  I26.99      Medications Discontinued During This Encounter  Medication Reason   levalbuterol (XOPENEX HFA) 45 MCG/ACT inhaler Patient has not taken in last 30 days   metoprolol succinate (TOPROL-XL) 50 MG 24 hr tablet Reorder   spironolactone (ALDACTONE) 25 MG tablet Reorder    Meds ordered this encounter  Medications   metoprolol succinate (TOPROL-XL) 50 MG 24 hr tablet    Sig: Take 1 tablet (50 mg total) by mouth daily. Take with or immediately following a meal.    Dispense:  90 tablet    Refill:  3   spironolactone (ALDACTONE) 25 MG tablet    Sig: Take 1 tablet (25 mg total) by mouth daily.    Dispense:  90 tablet    Refill:  3   This patients CHA2DS2-VASc Score 2 (HTN, CHF) and yearly risk of stroke 2.2%.   Recommendations:   Carl [name] is a 65 y.o.  male with past medical history of hypertension, hyperlipidemia, tobacco use disorder, who presented to the emergency department 08/27/2020 in typical atrial flutter with RVR.  He underwent successful direct-current cardioversion.  Patient was then admitted to the hospital again 09/19/2020-09/26/2020 with pneumonia, pulmonary embolism, atrial flutter, and systolic heart failure.  Patient presents for 1 month follow-up of atrial fibrillation and heart failure.  He is presently without complaints  concerning for acute heart failure and patient reports no known recurrence of atrial fibrillation.  Reviewed and discussed with patient results of recent echocardiogram showing improved heart function to LVEF of 66% and no evidence of pulmonary hypertension.  According to patient's home monitoring device resting heart rate remains greater than 70 bpm, however patient remains hesitant to increase dosing or add medications.  Also patient's blood pressure is elevated today, goal is less than 130/80 mmHg.  Discussed with patient regarding option of increasing metoprolol from 50 mg to 100 mg daily, which would improve both heart rate and blood pressure control.  However patient prefers not to make adjustments at this time, instead he will monitor his blood pressure for 1 week and notify our office if it remains >130/80 mmHg.  Also again discussed with patient regarding rhythm control of atrial fibrillation including options of antiarrhythmic medications and/or evaluation by electrophysiology. Despite discussion of the risks and benefits, patient continues to prefer not to make changes to or add to his medications at this time.  He also wishes not to undergo evaluation by electrophysiology at this time. Patient will continue present cardiovascular medications including Eliquis, aspirin, atorvastatin, metoprolol succinate, spironolactone.  Will obtain BMP, CBC, and lipid profile testing.   Patient will continue to follow with hematology regarding management of pulmonary embolism.  Follow up in 3 months.   Rayford Halsted, PA-C 11/27/2020, 3:00 PM Office: [phone]

## 2020-11-27 ENCOUNTER — Encounter: Payer: Self-pay | Admitting: Student

## 2020-11-27 ENCOUNTER — Other Ambulatory Visit: Payer: Self-pay

## 2020-11-27 ENCOUNTER — Telehealth: Payer: Federal, State, Local not specified - PPO | Admitting: Student

## 2020-11-27 VITALS — BP 140/87 | HR 85

## 2020-11-27 DIAGNOSIS — Z7901 Long term (current) use of anticoagulants: Secondary | ICD-10-CM

## 2020-11-27 DIAGNOSIS — I480 Paroxysmal atrial fibrillation: Secondary | ICD-10-CM

## 2020-11-27 DIAGNOSIS — I5022 Chronic systolic (congestive) heart failure: Secondary | ICD-10-CM

## 2020-11-27 DIAGNOSIS — I10 Essential (primary) hypertension: Secondary | ICD-10-CM | POA: Diagnosis not present

## 2020-11-27 DIAGNOSIS — I2699 Other pulmonary embolism without acute cor pulmonale: Secondary | ICD-10-CM

## 2020-11-27 MED ORDER — SPIRONOLACTONE 25 MG PO TABS: 25.0000 mg | ORAL_TABLET | Freq: Every day | ORAL | 3 refills | Status: DC

## 2020-11-27 MED ORDER — METOPROLOL SUCCINATE ER 50 MG PO TB24: 50.0000 mg | ORAL_TABLET | Freq: Every day | ORAL | 3 refills | Status: DC

## 2020-11-27 NOTE — Progress Notes (Signed)
Discussed during today's virtual visit. Patient is aware of results.

## 2020-12-16 IMAGING — US IR THORACENTESIS ASP PLEURAL SPACE W/IMG GUIDE
1 series · 5 of 5 positions shown · non-contrast
Comparison: none

INDICATION: Patient with history of HF, atrial fibrillation/flutter, dyspnea,
and left pleural effusion. Request is made for diagnostic and
therapeutic left thoracentesis.

[Series 1: ir (id) (id)/(id)/(id) ir · 5 of 5 slices shown]
[im 1/5]
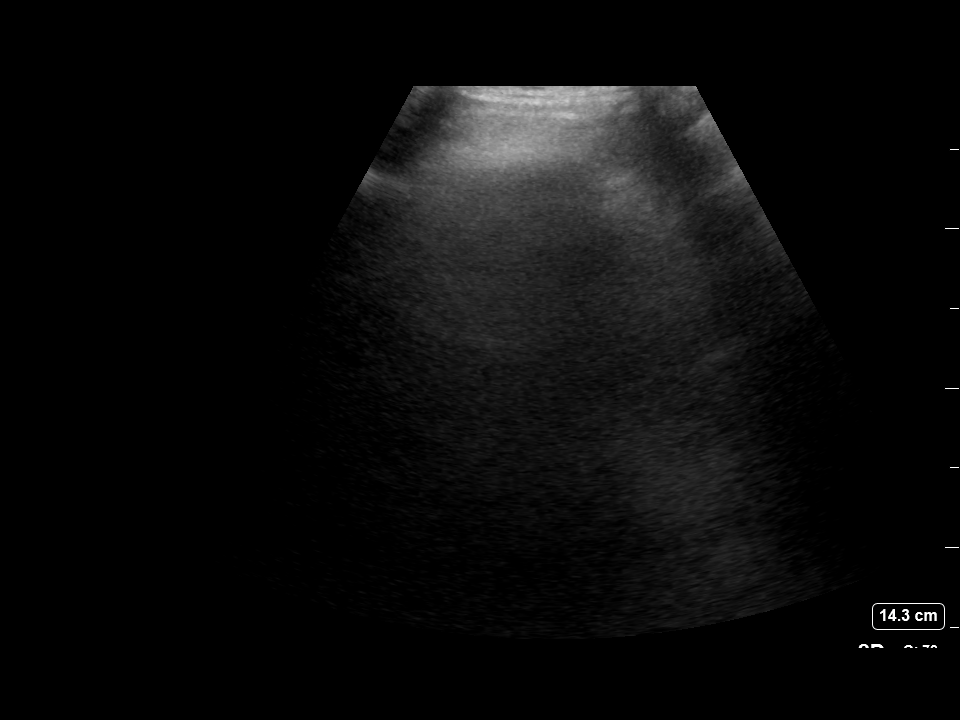
[im 2/5]
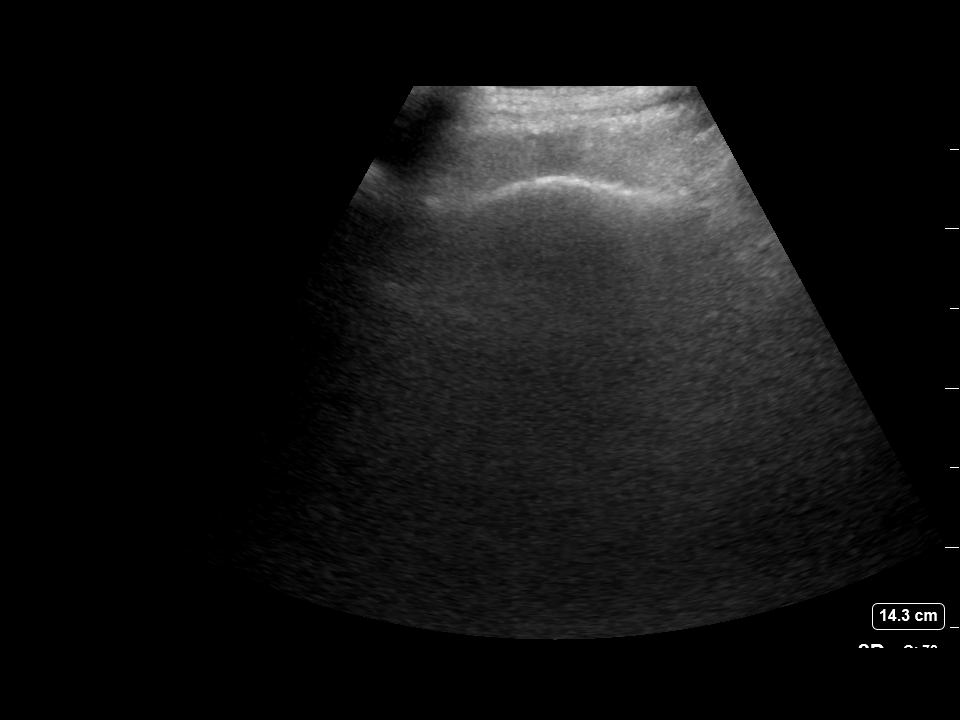
[im 3/5]
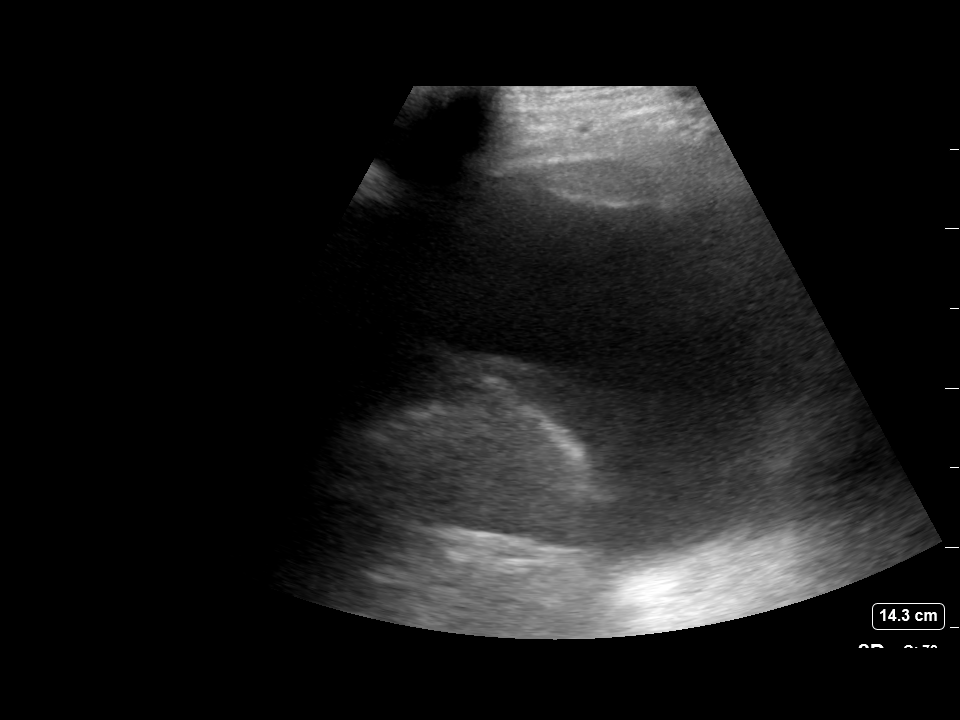
[im 4/5]
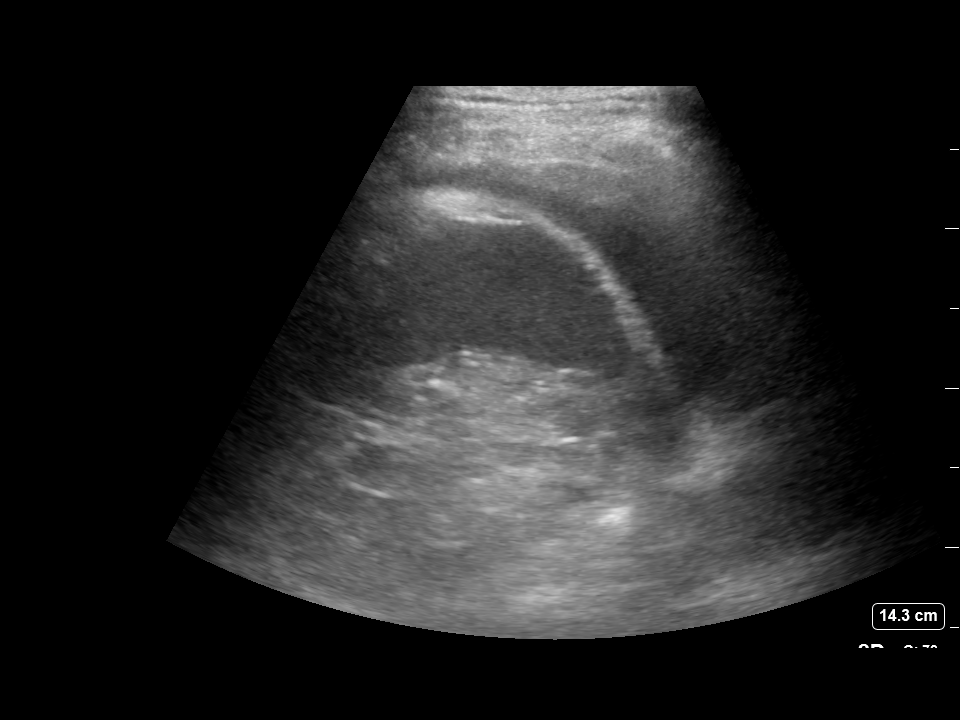
[im 5/5]
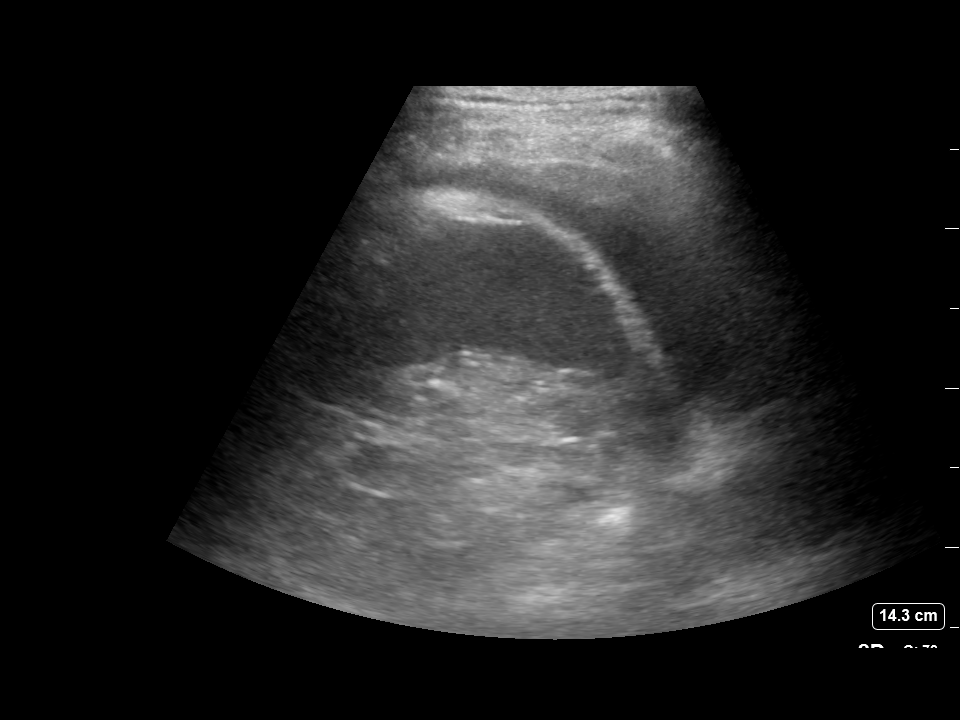

[5 of 5 positions shown; findings below may reference images not displayed]

EXAM:
ULTRASOUND GUIDED DIAGNOSTIC AND THERAPEUTIC LEFT THORACENTESIS

MEDICATIONS:
10 mL 1% lidocaine

COMPLICATIONS:
None immediate.

PROCEDURE:
An ultrasound guided thoracentesis was thoroughly discussed with the
patient and questions answered. The benefits, risks, alternatives
and complications were also discussed. The patient understands and
wishes to proceed with the procedure. Written consent was obtained.

Ultrasound was performed to localize and mark an adequate pocket of
fluid in the left chest. The area was then prepped and draped in the
normal sterile fashion. 1% Lidocaine was used for local anesthesia.
Under ultrasound guidance a 6 Fr Safe-T-Centesis catheter was
introduced. Thoracentesis was performed. The catheter was removed
and a dressing applied.
FINDINGS: A total of approximately 1.5 L of hazy amber fluid was removed.
Procedure was stopped after 1.5 L secondary to patient's hypotension
(73/63, 92/74) - catheter was removed and patient was laid back in
bed with BP improvement (106/87). Patient remained asymptomatic
throughout procedure (denied dizziness, vision changes). Samples
were sent to the laboratory as requested by the clinical team.
IMPRESSION: Successful ultrasound guided left thoracentesis yielding 1.5 L of
pleural fluid.

## 2020-12-21 ENCOUNTER — Telehealth: Payer: Self-pay

## 2020-12-21 NOTE — Telephone Encounter (Signed)
Called and left message with CT appt on 4/18 at 10 am, arrive at 0945, liquids only 4 hours prior. Ask him to call office if needed.

## 2020-12-21 NOTE — Telephone Encounter (Signed)
-----   Message from Artis Delay, MD sent at 12/21/2020  1:48 PM EST ----- Regarding: can you help him schedule CT for 4/18>? thanks

## 2021-01-21 DIAGNOSIS — I2699 Other pulmonary embolism without acute cor pulmonale: Secondary | ICD-10-CM | POA: Diagnosis not present

## 2021-01-21 DIAGNOSIS — Z1211 Encounter for screening for malignant neoplasm of colon: Secondary | ICD-10-CM | POA: Diagnosis not present

## 2021-01-21 DIAGNOSIS — I480 Paroxysmal atrial fibrillation: Secondary | ICD-10-CM | POA: Diagnosis not present

## 2021-02-04 ENCOUNTER — Other Ambulatory Visit: Payer: Self-pay

## 2021-02-04 DIAGNOSIS — I480 Paroxysmal atrial fibrillation: Secondary | ICD-10-CM

## 2021-02-04 DIAGNOSIS — I5022 Chronic systolic (congestive) heart failure: Secondary | ICD-10-CM

## 2021-02-04 MED ORDER — METOPROLOL SUCCINATE ER 50 MG PO TB24: 50.0000 mg | ORAL_TABLET | Freq: Every day | ORAL | 3 refills | Status: DC

## 2021-02-25 ENCOUNTER — Telehealth: Payer: Self-pay

## 2021-02-25 ENCOUNTER — Inpatient Hospital Stay: Payer: Federal, State, Local not specified - PPO | Attending: Hematology and Oncology

## 2021-02-25 ENCOUNTER — Ambulatory Visit (HOSPITAL_COMMUNITY): Admission: RE | Admit: 2021-02-25 | Payer: Federal, State, Local not specified - PPO | Source: Ambulatory Visit

## 2021-02-25 NOTE — Telephone Encounter (Signed)
Attempted to call. Voice mail box full on mobile and land line # incorrect.

## 2021-02-26 ENCOUNTER — Encounter: Payer: Self-pay | Admitting: Hematology and Oncology

## 2021-02-26 ENCOUNTER — Inpatient Hospital Stay: Payer: Federal, State, Local not specified - PPO | Admitting: Hematology and Oncology

## 2021-03-04 ENCOUNTER — Telehealth: Payer: Self-pay

## 2021-03-04 NOTE — Telephone Encounter (Signed)
Received a call that he need to reschedule appts. He was not aware that he had any appts.  Attempted to call and mailbox full. Unable to leave a message.

## 2021-03-18 ENCOUNTER — Telehealth: Payer: Self-pay

## 2021-03-18 NOTE — Telephone Encounter (Signed)
RN returned call regarding request to re-schedule CT scan.    Voicemail left for patient with contact number for radiology scheduling department, and instructions to call back once scheduled for MD follow up.

## 2021-03-22 ENCOUNTER — Ambulatory Visit (HOSPITAL_BASED_OUTPATIENT_CLINIC_OR_DEPARTMENT_OTHER): Payer: Federal, State, Local not specified - PPO

## 2021-03-22 DIAGNOSIS — I10 Essential (primary) hypertension: Secondary | ICD-10-CM | POA: Diagnosis not present

## 2021-03-22 DIAGNOSIS — E7800 Pure hypercholesterolemia, unspecified: Secondary | ICD-10-CM | POA: Diagnosis not present

## 2021-03-22 DIAGNOSIS — M109 Gout, unspecified: Secondary | ICD-10-CM | POA: Diagnosis not present

## 2021-03-22 DIAGNOSIS — N529 Male erectile dysfunction, unspecified: Secondary | ICD-10-CM | POA: Diagnosis not present

## 2021-03-22 DIAGNOSIS — Z125 Encounter for screening for malignant neoplasm of prostate: Secondary | ICD-10-CM | POA: Diagnosis not present

## 2021-03-22 DIAGNOSIS — Z0000 Encounter for general adult medical examination without abnormal findings: Secondary | ICD-10-CM | POA: Diagnosis not present

## 2021-04-02 ENCOUNTER — Other Ambulatory Visit: Payer: Self-pay

## 2021-04-02 ENCOUNTER — Inpatient Hospital Stay
Payer: Federal, State, Local not specified - PPO | Attending: Hematology and Oncology | Admitting: Hematology and Oncology

## 2021-04-02 DIAGNOSIS — Z79899 Other long term (current) drug therapy: Secondary | ICD-10-CM | POA: Diagnosis not present

## 2021-04-02 DIAGNOSIS — Z634 Disappearance and death of family member: Secondary | ICD-10-CM | POA: Insufficient documentation

## 2021-04-02 DIAGNOSIS — J90 Pleural effusion, not elsewhere classified: Secondary | ICD-10-CM | POA: Diagnosis not present

## 2021-04-02 DIAGNOSIS — Z7982 Long term (current) use of aspirin: Secondary | ICD-10-CM | POA: Diagnosis not present

## 2021-04-02 DIAGNOSIS — I429 Cardiomyopathy, unspecified: Secondary | ICD-10-CM | POA: Diagnosis not present

## 2021-04-02 DIAGNOSIS — I2699 Other pulmonary embolism without acute cor pulmonale: Secondary | ICD-10-CM | POA: Diagnosis not present

## 2021-04-02 DIAGNOSIS — I5021 Acute systolic (congestive) heart failure: Secondary | ICD-10-CM | POA: Insufficient documentation

## 2021-04-02 DIAGNOSIS — R600 Localized edema: Secondary | ICD-10-CM | POA: Diagnosis not present

## 2021-04-02 DIAGNOSIS — Z7901 Long term (current) use of anticoagulants: Secondary | ICD-10-CM | POA: Diagnosis not present

## 2021-04-03 ENCOUNTER — Encounter: Payer: Self-pay | Admitting: Hematology and Oncology

## 2021-04-03 NOTE — Progress Notes (Signed)
[facility] OFFICE PROGRESS NOTE  Roger Kill, PA-C  ASSESSMENT & PLAN:  Acute pulmonary embolism (HCC) We have extensive discussions about the role of chronic anticoagulation therapy Due to recent national shortage of IV contrast, we did not attempt to repeat CT angiogram He is completely asymptomatic After long discussion, he is in agreement to proceed with chronic maintenance treatment on Eliquis For that reason, there is no benefit of repeating CT angiogram  I explain to the patient the rational to consider extended duration of treatment with low dose Apixaban. This is based on a study published in NEJM  Apixaban for Extended Treatment of Venous Thromboembolism Chrisandra Netters, M.D., Judith Part, M.D., Ph.D., Montey Hora, M.D., Estill Batten, D.V.M., Oliver Barre, M.D., Rolla Flatten, M.D., Lavone Orn, Ph.D., Pharm.D., Buckner Malta, Ph.D., and Letta Median. Jens Som, M.D., for the [employer] Investigators* Malva Limes Med 2013; 368:699-708February 21, 2013DOI: 10.1056/[id number]  Methods In this randomized, double-blind study, we compared two doses of apixaban (2.5 mg and 5 mg, twice daily) with placebo in patients with venous thromboembolism who had completed 6 to 12 months of anticoagulation therapy and for whom there was clinical equipoise regarding the continuation or cessation of anticoagulation therapy. The study drugs were administered for 12 months  Results A total of 2486 patients underwent randomization, of whom 2482 were included in the intention-to-treat analyses. Symptomatic recurrent venous thromboembolism or death from venous thromboembolism occurred in 73 of the 829 patients (8.8%) who were receiving placebo, as compared with 14 of the 840 patients (1.7%) who were receiving 2.5 mg of apixaban (a difference of 7.2 percentage points; 95% confidence interval [CI], 5.0 to 9.3) and 14 of the 813 patients (1.7%) who were receiving 5 mg  of apixaban (a difference of 7.0 percentage points; 95% CI, 4.9 to 9.1) (P<0.001 for both comparisons). The rates of major bleeding were 0.5% in the placebo group, 0.2% in the 2.5-mg apixaban group, and 0.1% in the 5-mg apixaban group. The rates of clinically relevant nonmajor bleeding were 2.3% in the placebo group, 3.0% in the 2.5-mg apixaban group, and 4.2% in the 5-mg apixaban group. The rate of death from any cause was 1.7% in the placebo group, as compared with 0.8% in the 2.5-mg apixaban group and 0.5% in the 5-mg apixaban group.  Conclusions Extended anticoagulation with apixaban at either a treatment dose (5 mg) or a thromboprophylactic dose (2.5 mg) reduced the risk of recurrent venous thromboembolism without increasing the rate of major bleeding.  Once his current prescription runs out, he can call me and I will reduce the dose of apixaban to 2.5 mg indefinitely  Acute systolic CHF (congestive heart failure) (HCC) He has multiple cardiovascular risk factors Thankfully, his cardiomyopathy has resolved with his most recent echocardiogram We will continue aspirin therapy and long-term anticoagulation therapy with apixaban to reduce risk of stroke For future follow-up, I am comfortable for him to continue blood count monitoring and prescription refill through his primary care doctor However, if his primary care doctor is not comfortable following him, I will see him back next year   No orders of the defined types were placed in this encounter.   The total time spent in the appointment was 20 minutes encounter with patients including review of chart and various tests results, discussions about plan of care and coordination of care plan   All questions were answered. The patient knows to call the clinic with any problems, questions or concerns. No barriers to  learning was detected.    Artis Delay, MD 5/25/202211:15 AM  INTERVAL HISTORY: Carl [name] 65 y.o. male returns for follow-up  on history of pulmonary embolism and cardiomyopathy He is doing well on apixaban and aspirin The patient denies any recent signs or symptoms of bleeding such as spontaneous epistaxis, hematuria or hematochezia.   SUMMARY OF HEMATOLOGIC HISTORY: Carl [name] 65 y.o. male is here because of recent diagnosis of acute PE The patient was hospitalized in October with presentation with shortness of breath and was found to have acute PE According to the patient, he has been grieving since the death of his wife He was married for 25 years and she died from glioblastoma 5 to 6 years ago Since then, he has been drinking excessively to cope.  He usually would drink a large quantity of wine or bourbon, approximately half a bottle 4 to 5 days a week He notes he has been gaining and healthy weight and not taking good care of himself When he presented to the ER, with the shortness of breath, CT imaging show evidence of pulmonary emboli: Ct angio on 08/27/2020: Acute segmental and subsegmental pulmonary emboli bilaterally. No evidence of right heart strain. Patchy bilateral atelectasis.  In the course of his hospital stay, venous Doppler ultrasound was negative for DVT He has noted bilateral lower extremity edema for some time  On August 28, 2020, echocardiogram showed: 1. Left ventricular ejection fraction, by estimation, is 50 to 55%. The left ventricle has low normal function. The left ventricle has no regional wall motion abnormalities. Left ventricular diastolic parameters were normal.  2. Right ventricular systolic function is normal. The right ventricular size is normal. There is moderately elevated pulmonary artery systolic pressure. The estimated right ventricular systolic pressure is 40.6 mmHg.  3. The mitral valve is normal in structure. Trivial mitral valve regurgitation. No evidence of mitral stenosis.  4. The aortic valve is normal in structure. Aortic valve regurgitation is not visualized.   5. The inferior vena cava is dilated in size with <50% respiratory variability, suggesting right atrial pressure of 15 mmHg.   He was admitted to the hospital again recently in November due to sepsis. He developed atrial flutter.  On November 12th, 2021, echocardiogram showed  1. Left ventricular ejection fraction, by estimation, is 50%. The left ventricle has mildly decreased function. The left ventricle demonstrates global hypokinesis. Left ventricular diastolic parameters are indeterminate.  2. Right ventricular systolic function is normal. The right ventricular size is normal. The estimated right ventricular systolic pressure is 36.7 mmHg.  3. Left atrial size was mildly dilated.  4. The mitral valve is normal in structure. Trivial mitral valve regurgitation. No evidence of mitral stenosis.  5. The aortic valve is tricuspid. Aortic valve regurgitation is not visualized. Mild aortic valve sclerosis is present, with no evidence of aortic valve stenosis.  6. The inferior vena cava is dilated in size with <50% respiratory variability, suggesting right atrial pressure of 15 mmHg.   Unfortunately, repeat echocardiogram on November 15 th, 2021 showed evidence of cardiomyopathy  1. Left ventricular ejection fraction, by estimation, is 20 to 25%. The left ventricle has severely decreased function. The left ventricle demonstrates global hypokinesis.  2. No significant pericardial effusion. Moderate left sided pleural effusion.  3. The inferior vena cava is normal in size with greater than 50% respiratory variability, suggesting right atrial pressure of 3 mmHg   Prior to diagnosis of PE,  He denies leg warmth, tenderness & erythema.  He denies recent chest pain on exertion, shortness of breath on minimal exertion, pre-syncopal episodes, hemoptysis, or palpitation. He denies recent history of trauma, long distance travel, dehydration, recent surgery, smoking or prolonged immobilization. He had  no prior history or diagnosis of cancer. His age appropriate screening programs are up-to-date. He had prior surgeries before and never had perioperative thromboembolic events. He has used some over-the-counter testosterone replacement therapy for some time but none recently There is no family history of blood clots or miscarriages.  While in the hospital, thrombophilia panel was done on October 19 which showed low Antithrombin III level and protein C activity.  Since his recent hospitalization, he has quit drinking alcohol He has lost a lot of weight but he felt better The patient denies any recent signs or symptoms of bleeding such as spontaneous epistaxis, hematuria or hematochezia. In January 2022, repeat echocardiogram show improvement of ejection fraction but with mild persistent findings of dilated left atrium On May 2022, we made informed decision to continue anticoagulation therapy indefinitely but with plan to reduce the dose to 2.5 mg of Eliquis twice daily for secondary prevention  I have reviewed the past medical history, past surgical history, social history and family history with the patient and they are unchanged from previous note.  ALLERGIES:  has No Known Allergies.  MEDICATIONS:  Current Outpatient Medications  Medication Sig Dispense Refill  . allopurinol (ZYLOPRIM) 100 MG tablet Take 100 mg by mouth in the morning.     Marland Kitchen apixaban (ELIQUIS) 5 MG TABS tablet Take 1 tablet (5 mg total) by mouth 2 (two) times daily.    Marland Kitchen aspirin EC 81 MG EC tablet Take 1 tablet (81 mg total) by mouth daily. Swallow whole. 30 tablet 11  . atorvastatin (LIPITOR) 10 MG tablet Take 10 mg by mouth daily.    . Lactobacillus Rhamnosus, GG, (RA PROBIOTIC DIGESTIVE CARE) CAPS Take 1 capsule by mouth in the morning.    . metoprolol succinate (TOPROL-XL) 50 MG 24 hr tablet Take 1 tablet (50 mg total) by mouth daily. Take with or immediately following a meal. 90 tablet 3  . spironolactone (ALDACTONE)  25 MG tablet Take 1 tablet (25 mg total) by mouth daily. 90 tablet 3  . tadalafil (CIALIS) 20 MG tablet Take 20 mg by mouth daily as needed for erectile dysfunction.      No current facility-administered medications for this visit.     REVIEW OF SYSTEMS:   Constitutional: Denies fevers, chills or night sweats Eyes: Denies blurriness of vision Ears, nose, mouth, throat, and face: Denies mucositis or sore throat Respiratory: Denies cough, dyspnea or wheezes Cardiovascular: Denies palpitation, chest discomfort or lower extremity swelling Gastrointestinal:  Denies nausea, heartburn or change in bowel habits Skin: Denies abnormal skin rashes Lymphatics: Denies new lymphadenopathy  Neurological:Denies numbness, tingling or new weaknesses Behavioral/Psych: Mood is stable, no new changes  All other systems were reviewed with the patient and are negative.  PHYSICAL EXAMINATION: ECOG PERFORMANCE STATUS: 0 - Asymptomatic  Vitals:   04/02/21 0901  BP: (!) 149/69  Pulse: (!) 113  Resp: 20  Temp: 98 F (36.7 C)  SpO2: 98%   Filed Weights   04/02/21 0901  Weight: 241 lb 12.8 oz (109.7 kg)    GENERAL:alert, no distress and comfortable  NEURO: alert & oriented x 3 with fluent speech, no focal motor/sensory deficits  LABORATORY DATA:  I have reviewed the data as listed     Component Value Date/Time   NA  137 09/26/2020 0536   K 3.9 09/26/2020 0536   CL 99 09/26/2020 0536   CO2 33 (H) 09/26/2020 0536   GLUCOSE 89 09/26/2020 0536   BUN 11 09/26/2020 0536   CREATININE 0.88 09/26/2020 0536   CALCIUM 8.7 (L) 09/26/2020 0536   PROT 6.0 (L) 09/19/2020 1133   ALBUMIN 2.3 (L) 09/19/2020 1133   AST 15 09/19/2020 1133   ALT 27 09/19/2020 1133   ALKPHOS 92 09/19/2020 1133   BILITOT 0.7 09/19/2020 1133   GFRNONAA >60 09/26/2020 0536    No results found for: SPEP, UPEP  Lab Results  Component Value Date   WBC 11.1 (H) 09/26/2020   NEUTROABS 15.0 (H) 09/19/2020   HGB 12.8 (L)  09/26/2020   HCT 40.5 09/26/2020   MCV 98.5 09/26/2020   PLT 349 09/26/2020      Chemistry      Component Value Date/Time   NA 137 09/26/2020 0536   K 3.9 09/26/2020 0536   CL 99 09/26/2020 0536   CO2 33 (H) 09/26/2020 0536   BUN 11 09/26/2020 0536   CREATININE 0.88 09/26/2020 0536      Component Value Date/Time   CALCIUM 8.7 (L) 09/26/2020 0536   ALKPHOS 92 09/19/2020 1133   AST 15 09/19/2020 1133   ALT 27 09/19/2020 1133   BILITOT 0.7 09/19/2020 1133

## 2021-04-03 NOTE — Assessment & Plan Note (Signed)
We have extensive discussions about the role of chronic anticoagulation therapy Due to recent national shortage of IV contrast, we did not attempt to repeat CT angiogram He is completely asymptomatic After long discussion, he is in agreement to proceed with chronic maintenance treatment on Eliquis For that reason, there is no benefit of repeating CT angiogram  I explain to the patient the rational to consider extended duration of treatment with low dose Apixaban. This is based on a study published in NEJM  Apixaban for Extended Treatment of Venous Thromboembolism Chrisandra Netters, M.D., Judith Part, M.D., Ph.D., Montey Hora, M.D., Estill Batten, D.V.M., Oliver Barre, M.D., Rolla Flatten, M.D., Lavone Orn, Ph.D., Pharm.D., Buckner Malta, Ph.D., and Letta Median. Jens Som, M.D., for the [employer] Investigators* Malva Limes Med 2013; 368:699-708February 21, 2013DOI: 10.1056/[id number]  Methods In this randomized, double-blind study, we compared two doses of apixaban (2.5 mg and 5 mg, twice daily) with placebo in patients with venous thromboembolism who had completed 6 to 12 months of anticoagulation therapy and for whom there was clinical equipoise regarding the continuation or cessation of anticoagulation therapy. The study drugs were administered for 12 months  Results A total of 2486 patients underwent randomization, of whom 2482 were included in the intention-to-treat analyses. Symptomatic recurrent venous thromboembolism or death from venous thromboembolism occurred in 73 of the 829 patients (8.8%) who were receiving placebo, as compared with 14 of the 840 patients (1.7%) who were receiving 2.5 mg of apixaban (a difference of 7.2 percentage points; 95% confidence interval [CI], 5.0 to 9.3) and 14 of the 813 patients (1.7%) who were receiving 5 mg of apixaban (a difference of 7.0 percentage points; 95% CI, 4.9 to 9.1) (P<0.001 for both comparisons). The rates of major bleeding  were 0.5% in the placebo group, 0.2% in the 2.5-mg apixaban group, and 0.1% in the 5-mg apixaban group. The rates of clinically relevant nonmajor bleeding were 2.3% in the placebo group, 3.0% in the 2.5-mg apixaban group, and 4.2% in the 5-mg apixaban group. The rate of death from any cause was 1.7% in the placebo group, as compared with 0.8% in the 2.5-mg apixaban group and 0.5% in the 5-mg apixaban group.  Conclusions Extended anticoagulation with apixaban at either a treatment dose (5 mg) or a thromboprophylactic dose (2.5 mg) reduced the risk of recurrent venous thromboembolism without increasing the rate of major bleeding.  Once his current prescription runs out, he can call me and I will reduce the dose of apixaban to 2.5 mg indefinitely

## 2021-04-03 NOTE — Assessment & Plan Note (Signed)
He has multiple cardiovascular risk factors Thankfully, his cardiomyopathy has resolved with his most recent echocardiogram We will continue aspirin therapy and long-term anticoagulation therapy with apixaban to reduce risk of stroke For future follow-up, I am comfortable for him to continue blood count monitoring and prescription refill through his primary care doctor However, if his primary care doctor is not comfortable following him, I will see him back next year

## 2021-04-16 ENCOUNTER — Encounter: Payer: Self-pay | Admitting: Student

## 2021-04-23 DIAGNOSIS — K5730 Diverticulosis of large intestine without perforation or abscess without bleeding: Secondary | ICD-10-CM | POA: Diagnosis not present

## 2021-04-23 DIAGNOSIS — K635 Polyp of colon: Secondary | ICD-10-CM | POA: Diagnosis not present

## 2021-04-23 DIAGNOSIS — Z1211 Encounter for screening for malignant neoplasm of colon: Secondary | ICD-10-CM | POA: Diagnosis not present

## 2021-05-15 NOTE — Progress Notes (Signed)
Primary Physician/Referring:  Heywood Bene, PA-C  Patient ID: Carl [name], male    DOB: [birth year], 65 y.o.   MRN: [id number]  Chief Complaint  Patient presents with   Atrial Fibrillation   Follow-up   HPI:    Carl [name]  is a 65 y.o. male with past medical history of hypertension, hyperlipidemia, tobacco use disorder, who presented to the emergency department 08/27/2020 in typical atrial flutter with RVR.  He underwent successful direct-current cardioversion.  Patient was then admitted to the hospital again 09/19/2020-09/26/2020 with pneumonia, pulmonary embolism, atrial flutter, and systolic heart failure.   Patient presents for 66-monthfollow-up of heart failure and atrial fibrillation.  He continues to do well without known recurrence of atrial fibrillation or atrial flutter.  He continues to monitor his heart rate and blood pressure regularly.  States home blood pressure readings are consistently <135/85 mmHg.  He does report he experiences some mild fatigue and/or lack of motivation.  He is tolerating anticoagulation without bleeding diathesis. Denies leg swelling, orthopnea, PND, syncope, near syncope, dizziness. He continues to be active on a daily basis without issue.   Patient was recently seen by hematology who had been managing anticoagulation for pulmonary embolus.  Hematology agrees with continuing maintenance dose of Eliquis for stroke prevention in the setting of atrial fibrillation/flutter.  Patient is now taking Eliquis 5 mg p.o. twice daily.  Past Medical History:  Diagnosis Date   Hypercholesteremia    Primary hypertension    Pulmonary embolism ([facility] 08/27/2020   Typical atrial flutter ([facility] 08/27/2020 with PE   Past Surgical History:  Procedure Laterality Date   IR THORACENTESIS ASP PLEURAL SPACE W/IMG GUIDE  09/20/2020   LAPAROTOMY     LEFT HEART CATH AND CORONARY ANGIOGRAPHY N/A 09/25/2020   Procedure: LEFT HEART CATH AND CORONARY ANGIOGRAPHY;   Surgeon: PNigel Mormon MD;  [facility];  Service: Cardiovascular;  Laterality: N/A;   Family History  Problem Relation Age of Onset   CAD Mother 529  Heart attack Father    Cancer Sister        lung   Heart disease Brother    Heart failure Brother    Heart attack Brother     Social History   Tobacco Use   Smoking status: Former    Packs/day: 0.25    Years: 15.00    Pack years: 3.75    Types: Cigars, Cigarettes    Quit date: 08/23/2020    Years since quitting: 0.7   Smokeless tobacco: Never   Tobacco comments:    Once a week  Substance Use Topics   Alcohol use: Not Currently    Alcohol/week: 1.0 standard drink    Types: 1 Glasses of wine per week    Comment: Occasional   Marital Status: Widowed   ROS  Review of Systems  Cardiovascular:  Negative for chest pain, claudication, leg swelling, near-syncope, orthopnea, palpitations, paroxysmal nocturnal dyspnea and syncope.  Respiratory:  Negative for shortness of breath.   Gastrointestinal:  Negative for melena.  Neurological:  Negative for dizziness.   Objective  Blood pressure 134/84, pulse 87, temperature 98.7 F (37.1 C), temperature source Temporal, resp. rate 16, height 5' 10"  (1.778 m), weight 233 lb 9.6 oz (106 kg), SpO2 97 %.  Vitals with BMI 05/16/2021 04/02/2021 11/27/2020  Height 5' 10"  - -  Weight 233 lbs 10 oz 241 lbs 13 oz -  BMI 378.29- -  Systolic [id number]  Diastolic 84 69 87  Pulse 87 113 85     Physical Exam Vitals reviewed.  Constitutional:      General: He is not in acute distress. Cardiovascular:     Rate and Rhythm: Normal rate and regular rhythm.     Pulses: Intact distal pulses.     Heart sounds: S1 normal and S2 normal. No murmur heard.   No gallop.     Comments: No JVD.  Pulmonary:     Effort: Pulmonary effort is normal. No respiratory distress.     Breath sounds: No wheezing, rhonchi or rales.  Musculoskeletal:     Right lower leg: Edema (trace) present.      Left lower leg: Edema (trace) present.  Neurological:     Mental Status: He is alert.   Laboratory examination:   Recent Labs    09/24/20 0452 09/25/20 0506 09/26/20 0536  NA 134* 136 137  K 3.1* 4.0 3.9  CL 91* 95* 99  CO2 34* 32 33*  GLUCOSE 117* 116* 89  BUN 16 14 11   CREATININE 0.97 0.94 0.88  CALCIUM 9.0 9.0 8.7*  GFRNONAA >60 >60 >60   CrCl cannot be calculated (Patient's most recent lab result is older than the maximum 21 days allowed.).  CMP Latest Ref Rng & Units 09/26/2020 09/25/2020 09/24/2020  Glucose 70 - 99 mg/dL 89 116(H) 117(H)  BUN 8 - 23 mg/dL 11 14 16   Creatinine 0.61 - 1.24 mg/dL 0.88 0.94 0.97  Sodium 135 - 145 mmol/L 137 136 134(L)  Potassium 3.5 - 5.1 mmol/L 3.9 4.0 3.1(L)  Chloride 98 - 111 mmol/L 99 95(L) 91(L)  CO2 22 - 32 mmol/L 33(H) 32 34(H)  Calcium 8.9 - 10.3 mg/dL 8.7(L) 9.0 9.0  Total Protein 6.5 - 8.1 g/dL - - -  Total Bilirubin 0.3 - 1.2 mg/dL - - -  Alkaline Phos 38 - 126 U/L - - -  AST 15 - 41 U/L - - -  ALT 0 - 44 U/L - - -   CBC Latest Ref Rng & Units 09/26/2020 09/23/2020 09/20/2020  WBC 4.0 - 10.5 K/uL 11.1(H) 10.7(H) 12.6(H)  Hemoglobin 13.0 - 17.0 g/dL 12.8(L) 12.6(L) 12.3(L)  Hematocrit 39.0 - 52.0 % 40.5 38.3(L) 39.5  Platelets 150 - 400 K/uL 349 369 333    Lipid Panel No results for input(s): CHOL, TRIG, LDLCALC, VLDL, HDL, CHOLHDL, LDLDIRECT in the last 8760 hours.  HEMOGLOBIN A1C No results found for: HGBA1C, MPG TSH Recent Labs    08/27/20 1430 09/21/20 0441  TSH 0.457 2.577   External labs:  03/22/2021: Hgb 14.6, HCT 42.4, MCV 95.0, platelet 191 Sodium 139, potassium 4.3, BUN 16, creatinine 0.73, ALT 16, AST 18, alk phos 67, GFR >90 LDL 89, total cholesterol 177, triglycerides 123, HDL 66,  02/17/2020 LDL 145, total cholesterol 229, triglycerides 136, HDL 62, non-HDL 167 Allergies  No Known Allergies    Medications Prior to Visit:   Outpatient Medications Prior to Visit  Medication Sig Dispense  Refill   allopurinol (ZYLOPRIM) 100 MG tablet Take 100 mg by mouth in the morning.      apixaban (ELIQUIS) 5 MG TABS tablet Take 1 tablet (5 mg total) by mouth 2 (two) times daily.     aspirin EC 81 MG EC tablet Take 1 tablet (81 mg total) by mouth daily. Swallow whole. 30 tablet 11   atorvastatin (LIPITOR) 10 MG tablet Take 10 mg by mouth daily.     Lactobacillus Rhamnosus, GG, (RA PROBIOTIC  DIGESTIVE CARE) CAPS Take 1 capsule by mouth in the morning.     metoprolol succinate (TOPROL-XL) 50 MG 24 hr tablet Take 1 tablet (50 mg total) by mouth daily. Take with or immediately following a meal. 90 tablet 3   spironolactone (ALDACTONE) 25 MG tablet Take 1 tablet (25 mg total) by mouth daily. 90 tablet 3   tadalafil (CIALIS) 20 MG tablet Take 20 mg by mouth daily as needed for erectile dysfunction.      No facility-administered medications prior to visit.     Final Medications at End of Visit    Current Meds  Medication Sig   allopurinol (ZYLOPRIM) 100 MG tablet Take 100 mg by mouth in the morning.    apixaban (ELIQUIS) 5 MG TABS tablet Take 1 tablet (5 mg total) by mouth 2 (two) times daily.   aspirin EC 81 MG EC tablet Take 1 tablet (81 mg total) by mouth daily. Swallow whole.   atorvastatin (LIPITOR) 10 MG tablet Take 10 mg by mouth daily.   Lactobacillus Rhamnosus, GG, (RA PROBIOTIC DIGESTIVE CARE) CAPS Take 1 capsule by mouth in the morning.   metoprolol succinate (TOPROL-XL) 50 MG 24 hr tablet Take 1 tablet (50 mg total) by mouth daily. Take with or immediately following a meal.   spironolactone (ALDACTONE) 25 MG tablet Take 1 tablet (25 mg total) by mouth daily.   tadalafil (CIALIS) 20 MG tablet Take 20 mg by mouth daily as needed for erectile dysfunction.    Radiology:   No results found.   Chest x-ray 09/22/2020: 1. Marked reduction in size of the left pleural effusion status post thoracentesis. No pneumothorax. 2. Borderline enlargement of the cardiopericardial  silhouette.  CT chest 09/23/2020: 1. Moderate size pericardial effusion has developed since the previous study. 2. Trace amount of right pleural fluid layering dependently with minimal dependent atelectasis. Small left effusion layering dependently with mild dependent atelectasis. 3. Small pulmonary emboli seen previously cannot be visualized in the absence of contrast. 4. Aortic atherosclerosis.  Coronary artery calcification.  Aortic Atherosclerosis (ICD10-I70.0).  Cardiac Studies:   Lower Extremity Venous Duplex  08/28/20   +---------+---------------+---------+-----------+----------+--------------+     Summary: BILATERAL: - No evidence of deep vein thrombosis seen in the lower extremities, bilaterally. - No evidence of superficial venous thrombosis in the lower extremities, bilaterally. -   *See table(s) above for measurements and observations.    Preliminary     CT Angio Chest PE W and/or Wo Contrast.  Date: 08/27/2020 CLINICAL DATA:  Shortness of breath and chest pain  FINDINGS: Cardiovascular: Satisfactory opacification of the pulmonary arteries to the segmental level. There are filling defects in segmental branches of bilateral upper and right middle lobes. Additional suspected filling defects within subsegmental branches. Left atrial enlargement. No evidence of right heart strain. No pericardial effusion. Mediastinum/Nodes: There are no enlarged lymph nodes identified. Thyroid and esophagus are unremarkable. Lungs/Pleura: Patchy areas of atelectasis. No pleural effusion or pneumothorax. Upper Abdomen: No acute abnormality. Musculoskeletal: No acute osseous abnormality. Review of the MIP images confirms the above findings.  IMPRESSION: Acute segmental and subsegmental pulmonary emboli bilaterally. No evidence of right heart strain. Patchy bilateral atelectasis.   Coronary angiography 09/25/2020 LM: Normal LAD: Type 1 LAD that does not reach apex          Medial calcification without any  luminal stenosis         Large Diag 1 that reaches apex and also gives off septal perforators, making it a dual LAD system  No other diagonals present LCx: Normal Ramus: Normal RCA: Large, gives of several septal perforators, reaches apex         Minimal luminal irregularities No significant coronary artery disease  PCV ECHOCARDIOGRAM COMPLETE 49/20/1007 Normal LV systolic function with EF 66%. Left ventricle cavity is normal in size. Normal global wall motion. Normal diastolic filling pattern. Calculated EF 66%. Left atrial cavity is mildly dilated at 4.8 cm. Structurally normal tricuspid valve. No evidence of tricuspid stenosis. Mild tricuspid regurgitation. No evidence of pulmonary hypertension. Compared to the study done on 09/24/2020, ejection fraction has improved to normal. Compared to prior echocardiogram on 08/28/2020 where her EF was 50 to 55% with moderate pulmonary hypertension, pulm hypertension no longer present.   EKG:  05/16/2021: Sinus rhythm at a rate of 80 bpm.  Left atrial enlargement.  Left axis, left anterior fascicular block.  Incomplete right bundle branch block.  Poor of progression, cannot exclude anteroseptal infarct old.   EKG 10/23/2020: Sinus rhythm at a rate of 100 bpm.  Left atrial enlargement, left anterior fascicular block.  Poor R wave progression, cannot exclude anteroseptal infarct old.  Nonspecific T wave abnormality.  EKG 09/17/2020: Typical atrial flutter with 2: conduction and ventricular rate of 160 bpm.  Normal axis.  Incomplete right bundle branch block.  Nonspecific T wave abnormality.  EKG 08/28/2020: Sinus tachycardia at rate of 100 bpm, left axis deviation, left intrafascicular block.  Early R wave transition in V2, probably lead placement but cannot exclude RVH.  Nonspecific T abnormality.  Compared to EKG done 2020, diffuse inferior and lateral T wave inversions suggestive of ischemia are no longer present.  No change in V2 R wave  progression.  Abnormal EKG.  Assessment     ICD-10-CM   1. Paroxysmal atrial fibrillation (HCC)  I48.0 EKG 12-Lead    2. Chronic systolic heart failure (HCC)  I50.22     3. Essential hypertension  I10     4. Long term (current) use of anticoagulants  Z79.01     5. Hypercholesteremia  E78.00        There are no discontinued medications.   No orders of the defined types were placed in this encounter.  This patients CHA2DS2-VASc Score 2 (HTN, CHF) and yearly risk of stroke 2.2%.   Recommendations:   Carl [name] is a 65 y.o.  male with past medical history of hypertension, hyperlipidemia, tobacco use disorder, who presented to the emergency department 08/27/2020 in typical atrial flutter with RVR.  He underwent successful direct-current cardioversion.  Patient was then admitted to the hospital again 09/19/2020-09/26/2020 with pneumonia, pulmonary embolism, atrial flutter, and systolic heart failure.  Patient presents for 73-monthfollow-up of atrial fibrillation for failure.  He has had no known recurrence of atrial fibrillation since previous hospitalization.  Patient remains asymptomatic and is feeling relatively well.  He does report some mild fatigue, therefore advised him to take metoprolol in the evenings rather than the mornings.  Patient's blood pressure is well controlled and there is no notable evidence of heart failure at this time.  Patient's LVEF improved with rhythm and rate control and he is maintaining sinus rhythm we will hold off on antiarrhythmic therapy or EP evaluation at this time, however could consider this in the future.  He is tolerating anticoagulation without bleeding diathesis, will continue Eliquis for stroke prevention (5 mg p.o. twice daily).  We will also continue aspirin, atorvastatin, metoprolol, spironolactone.  Personally reviewed external labs, lipids are under excellent control and renal  function remained stable.  Follow-up in 6 months, sooner if  needed, for atrial fibrillation, heart failure, hypertension.   Alethia Berthold, PA-C 05/16/2021, 11:54 AM Office: [phone]

## 2021-05-16 ENCOUNTER — Ambulatory Visit: Payer: Federal, State, Local not specified - PPO | Admitting: Student

## 2021-05-16 ENCOUNTER — Encounter: Payer: Self-pay | Admitting: Student

## 2021-05-16 ENCOUNTER — Other Ambulatory Visit: Payer: Self-pay

## 2021-05-16 VITALS — BP 134/84 | HR 87 | Temp 98.7°F | Resp 16 | Ht 70.0 in | Wt 233.6 lb

## 2021-05-16 DIAGNOSIS — E7800 Pure hypercholesterolemia, unspecified: Secondary | ICD-10-CM | POA: Diagnosis not present

## 2021-05-16 DIAGNOSIS — Z7901 Long term (current) use of anticoagulants: Secondary | ICD-10-CM

## 2021-05-16 DIAGNOSIS — I10 Essential (primary) hypertension: Secondary | ICD-10-CM

## 2021-05-16 DIAGNOSIS — I5022 Chronic systolic (congestive) heart failure: Secondary | ICD-10-CM | POA: Diagnosis not present

## 2021-05-16 DIAGNOSIS — I480 Paroxysmal atrial fibrillation: Secondary | ICD-10-CM

## 2021-05-21 ENCOUNTER — Telehealth: Payer: Self-pay

## 2021-05-21 NOTE — Telephone Encounter (Signed)
Advise him to take 100 mg of metoprolol today as well. If his heart rate remains elevated tomorrow please have him call the office back and bring him in for visit with me and EKG (can add over lunch if I have no other options).

## 2021-05-21 NOTE — Telephone Encounter (Signed)
Patient called and stated that in the last few days he has been having his HR elevated.   HR 150 HR 145  HR 153  ...readings are from a FitBit and pulse oximeter.  He also stated that he has been doubling up on his metoprolol 50 mg since last night, because he thought "it was just a fluke". Please advise.

## 2021-05-21 NOTE — Telephone Encounter (Signed)
Called patient, NA, LMAM

## 2021-05-24 NOTE — Telephone Encounter (Signed)
2nd attempt : Called patient, NA, no VM to leave a message.

## 2021-05-27 NOTE — Telephone Encounter (Signed)
3rd attempt : Called patient, NA, LMAM

## 2021-11-01 ENCOUNTER — Other Ambulatory Visit: Payer: Self-pay | Admitting: Student

## 2021-11-01 DIAGNOSIS — I10 Essential (primary) hypertension: Secondary | ICD-10-CM

## 2021-11-01 DIAGNOSIS — I5022 Chronic systolic (congestive) heart failure: Secondary | ICD-10-CM

## 2021-11-18 ENCOUNTER — Ambulatory Visit: Payer: Federal, State, Local not specified - PPO | Admitting: Student

## 2021-12-05 ENCOUNTER — Emergency Department (HOSPITAL_COMMUNITY): Payer: Federal, State, Local not specified - PPO

## 2021-12-05 ENCOUNTER — Encounter (HOSPITAL_COMMUNITY): Payer: Self-pay | Admitting: Anesthesiology

## 2021-12-05 ENCOUNTER — Observation Stay (HOSPITAL_COMMUNITY)
Admission: EM | Admit: 2021-12-05 | Discharge: 2021-12-06 | Disposition: A | Discharge status: Home / Self Care | Payer: Federal, State, Local not specified - PPO | Attending: Internal Medicine | Admitting: Internal Medicine

## 2021-12-05 ENCOUNTER — Other Ambulatory Visit: Payer: Self-pay

## 2021-12-05 ENCOUNTER — Emergency Department (HOSPITAL_BASED_OUTPATIENT_CLINIC_OR_DEPARTMENT_OTHER): Payer: Federal, State, Local not specified - PPO

## 2021-12-05 ENCOUNTER — Encounter (HOSPITAL_COMMUNITY): Payer: Self-pay | Admitting: *Deleted

## 2021-12-05 DIAGNOSIS — R009 Unspecified abnormalities of heart beat: Secondary | ICD-10-CM | POA: Diagnosis present

## 2021-12-05 DIAGNOSIS — I480 Paroxysmal atrial fibrillation: Secondary | ICD-10-CM | POA: Diagnosis not present

## 2021-12-05 DIAGNOSIS — I484 Atypical atrial flutter: Secondary | ICD-10-CM | POA: Insufficient documentation

## 2021-12-05 DIAGNOSIS — Z20822 Contact with and (suspected) exposure to covid-19: Secondary | ICD-10-CM | POA: Insufficient documentation

## 2021-12-05 DIAGNOSIS — F17290 Nicotine dependence, other tobacco product, uncomplicated: Secondary | ICD-10-CM | POA: Diagnosis not present

## 2021-12-05 DIAGNOSIS — I2782 Chronic pulmonary embolism: Secondary | ICD-10-CM

## 2021-12-05 DIAGNOSIS — Z79899 Other long term (current) drug therapy: Secondary | ICD-10-CM | POA: Insufficient documentation

## 2021-12-05 DIAGNOSIS — E785 Hyperlipidemia, unspecified: Secondary | ICD-10-CM | POA: Diagnosis not present

## 2021-12-05 DIAGNOSIS — I4891 Unspecified atrial fibrillation: Secondary | ICD-10-CM

## 2021-12-05 DIAGNOSIS — I10 Essential (primary) hypertension: Secondary | ICD-10-CM

## 2021-12-05 DIAGNOSIS — I4892 Unspecified atrial flutter: Secondary | ICD-10-CM

## 2021-12-05 DIAGNOSIS — I2510 Atherosclerotic heart disease of native coronary artery without angina pectoris: Secondary | ICD-10-CM

## 2021-12-05 DIAGNOSIS — E7800 Pure hypercholesterolemia, unspecified: Secondary | ICD-10-CM | POA: Insufficient documentation

## 2021-12-05 DIAGNOSIS — Z7982 Long term (current) use of aspirin: Secondary | ICD-10-CM | POA: Diagnosis not present

## 2021-12-05 DIAGNOSIS — Z7901 Long term (current) use of anticoagulants: Secondary | ICD-10-CM | POA: Insufficient documentation

## 2021-12-05 LAB — COMPREHENSIVE METABOLIC PANEL
ALT: 15 U/L (ref 0–44)
AST: 33 U/L (ref 15–41)
Albumin: 3.2 g/dL — ABNORMAL LOW (ref 3.5–5.0)
Alkaline Phosphatase: 59 U/L (ref 38–126)
Anion gap: 8 (ref 5–15)
BUN: 11 mg/dL (ref 8–23)
CO2: 26 mmol/L (ref 22–32)
Calcium: 9 mg/dL (ref 8.9–10.3)
Chloride: 105 mmol/L (ref 98–111)
Creatinine, Ser: 0.82 mg/dL (ref 0.61–1.24)
GFR, Estimated: >60 mL/min (ref >60)
Glucose, Bld: 109 mg/dL — ABNORMAL HIGH (ref 70–99)
Potassium: 5.2 mmol/L — ABNORMAL HIGH (ref 3.5–5.1)
Sodium: 139 mmol/L (ref 135–145)
Total Bilirubin: 1.6 mg/dL — ABNORMAL HIGH (ref 0.3–1.2)
Total Protein: 6 g/dL — ABNORMAL LOW (ref 6.5–8.1)

## 2021-12-05 LAB — RESP PANEL BY RT-PCR (FLU A&B, COVID) ARPGX2
Influenza A by PCR: NEGATIVE
Influenza B by PCR: NEGATIVE
SARS Coronavirus 2 by RT PCR: NEGATIVE

## 2021-12-05 LAB — CBC WITH DIFFERENTIAL/PLATELET
Abs Immature Granulocytes: 0.03 10*3/uL (ref 0.00–0.07)
Basophils Absolute: 0.1 10*3/uL (ref 0.0–0.1)
Basophils Relative: 1 %
Eosinophils Absolute: 0.1 10*3/uL (ref 0.0–0.5)
Eosinophils Relative: 2 %
HCT: 44.5 % (ref 39.0–52.0)
Hemoglobin: 15.2 g/dL (ref 13.0–17.0)
Immature Granulocytes: 0 %
Lymphocytes Relative: 19 %
Lymphs Abs: 1.5 10*3/uL (ref 0.7–4.0)
MCH: 33.2 pg (ref 26.0–34.0)
MCHC: 34.2 g/dL (ref 30.0–36.0)
MCV: 97.2 fL (ref 80.0–100.0)
Monocytes Absolute: 0.9 10*3/uL (ref 0.1–1.0)
Monocytes Relative: 11 %
Neutro Abs: 5.5 10*3/uL (ref 1.7–7.7)
Neutrophils Relative %: 67 %
Platelets: 244 10*3/uL (ref 150–400)
RBC: 4.58 MIL/uL (ref 4.22–5.81)
RDW: 12.5 % (ref 11.5–15.5)
WBC: 8.2 10*3/uL (ref 4.0–10.5)
nRBC: 0 % (ref 0.0–0.2)

## 2021-12-05 LAB — TSH: TSH: 4.008 u[IU]/mL (ref 0.350–4.500)

## 2021-12-05 LAB — ECHOCARDIOGRAM COMPLETE
AR max vel: 3.37 cm2
AV Peak grad: 3.4 mmHg
Ao pk vel: 0.93 m/s
Area-P 1/2: 9.81 cm2
Height: 70 in
S' Lateral: 2.95 cm
Weight: 3744 oz

## 2021-12-05 LAB — BASIC METABOLIC PANEL
Anion gap: 8 (ref 5–15)
BUN: 9 mg/dL (ref 8–23)
CO2: 25 mmol/L (ref 22–32)
Calcium: 9 mg/dL (ref 8.9–10.3)
Chloride: 105 mmol/L (ref 98–111)
Creatinine, Ser: 0.83 mg/dL (ref 0.61–1.24)
GFR, Estimated: >60 mL/min (ref >60)
Glucose, Bld: 156 mg/dL — ABNORMAL HIGH (ref 70–99)
Potassium: 3.8 mmol/L (ref 3.5–5.1)
Sodium: 138 mmol/L (ref 135–145)

## 2021-12-05 LAB — HEMOGLOBIN A1C
Hgb A1c MFr Bld: 5.9 % — ABNORMAL HIGH (ref 4.8–5.6)
Mean Plasma Glucose: 123 mg/dL

## 2021-12-05 LAB — HEPARIN LEVEL (UNFRACTIONATED)
Heparin Unfractionated: 0.83 IU/mL — ABNORMAL HIGH (ref 0.30–0.70)
Heparin Unfractionated: 0.28 IU/mL — ABNORMAL LOW (ref 0.30–0.70)

## 2021-12-05 LAB — TROPONIN I (HIGH SENSITIVITY): Troponin I (High Sensitivity): 16 ng/L (ref <18)

## 2021-12-05 LAB — PHOSPHORUS: Phosphorus: 3 mg/dL (ref 2.5–4.6)

## 2021-12-05 LAB — MAGNESIUM: Magnesium: 2 mg/dL (ref 1.7–2.4)

## 2021-12-05 MED ORDER — SODIUM CHLORIDE 0.9 % IV SOLN: INTRAVENOUS | Status: DC

## 2021-12-05 MED ORDER — ACETAMINOPHEN 325 MG PO TABS: 650.0000 mg | ORAL_TABLET | Freq: Four times a day (QID) | ORAL | Status: DC | PRN

## 2021-12-05 MED ORDER — DILTIAZEM HCL-DEXTROSE 125-5 MG/125ML-% IV SOLN (PREMIX)
5.0000 mg/h | INTRAVENOUS | Status: DC
  Administered 2021-12-05 (×2): 15 mg/h via INTRAVENOUS
  Filled 2021-12-05: qty 125

## 2021-12-05 MED ORDER — AMIODARONE HCL IN DEXTROSE 360-4.14 MG/200ML-% IV SOLN: 30.0000 mg/h | INTRAVENOUS | Status: DC

## 2021-12-05 MED ORDER — DILTIAZEM HCL 60 MG PO TABS
60.0000 mg | ORAL_TABLET | Freq: Four times a day (QID) | ORAL | Status: DC
  Administered 2021-12-05 – 2021-12-06 (×3): 60 mg via ORAL
  Filled 2021-12-05 (×3): qty 1

## 2021-12-05 MED ORDER — DILTIAZEM LOAD VIA INFUSION
15.0000 mg | Freq: Once | INTRAVENOUS | Status: AC
  Administered 2021-12-05: 15 mg via INTRAVENOUS
  Filled 2021-12-05: qty 15

## 2021-12-05 MED ORDER — HEPARIN (PORCINE) 25000 UT/250ML-% IV SOLN
1350.0000 [IU]/h | INTRAVENOUS | Status: AC
  Administered 2021-12-05: 1350 [IU]/h via INTRAVENOUS
  Administered 2021-12-05: 1150 [IU]/h via INTRAVENOUS
  Filled 2021-12-05 (×2): qty 250

## 2021-12-05 MED ORDER — ACETAMINOPHEN 650 MG RE SUPP: 650.0000 mg | Freq: Four times a day (QID) | RECTAL | Status: DC | PRN

## 2021-12-05 MED ORDER — AMIODARONE LOAD VIA INFUSION
150.0000 mg | Freq: Once | INTRAVENOUS | Status: AC
Start: 2021-12-05 — End: 2021-12-05
  Administered 2021-12-05: 150 mg via INTRAVENOUS
  Filled 2021-12-05: qty 83.34

## 2021-12-05 MED ORDER — ASPIRIN EC 81 MG PO TBEC
81.0000 mg | DELAYED_RELEASE_TABLET | Freq: Every day | ORAL | Status: DC
  Administered 2021-12-05 – 2021-12-06 (×2): 81 mg via ORAL
  Filled 2021-12-05 (×2): qty 1

## 2021-12-05 MED ORDER — DILTIAZEM HCL-DEXTROSE 125-5 MG/125ML-% IV SOLN (PREMIX)
5.0000 mg/h | INTRAVENOUS | Status: DC
  Administered 2021-12-05: 5 mg/h via INTRAVENOUS
  Filled 2021-12-05: qty 125

## 2021-12-05 MED ORDER — SPIRONOLACTONE 25 MG PO TABS
25.0000 mg | ORAL_TABLET | Freq: Every day | ORAL | Status: DC
  Administered 2021-12-06: 25 mg via ORAL
  Filled 2021-12-05: qty 1

## 2021-12-05 MED ORDER — HEPARIN BOLUS VIA INFUSION
4000.0000 [IU] | Freq: Once | INTRAVENOUS | Status: AC
  Administered 2021-12-05: 4000 [IU] via INTRAVENOUS
  Filled 2021-12-05: qty 4000

## 2021-12-05 MED ORDER — ONDANSETRON HCL 4 MG/2ML IJ SOLN
4.0000 mg | Freq: Once | INTRAMUSCULAR | Status: AC
  Administered 2021-12-05: 4 mg via INTRAVENOUS
  Filled 2021-12-05: qty 2

## 2021-12-05 MED ORDER — AMIODARONE HCL IN DEXTROSE 360-4.14 MG/200ML-% IV SOLN
60.0000 mg/h | INTRAVENOUS | Status: AC
  Administered 2021-12-05 (×2): 60 mg/h via INTRAVENOUS
  Filled 2021-12-05 (×2): qty 200

## 2021-12-05 MED ORDER — ALLOPURINOL 100 MG PO TABS
100.0000 mg | ORAL_TABLET | Freq: Every morning | ORAL | Status: DC
  Administered 2021-12-06: 100 mg via ORAL
  Filled 2021-12-05: qty 1

## 2021-12-05 MED ORDER — ATORVASTATIN CALCIUM 10 MG PO TABS
10.0000 mg | ORAL_TABLET | Freq: Every day | ORAL | Status: DC
  Administered 2021-12-05 – 2021-12-06 (×2): 10 mg via ORAL
  Filled 2021-12-05 (×2): qty 1

## 2021-12-05 MED ORDER — IOHEXOL 350 MG/ML SOLN
100.0000 mL | Freq: Once | INTRAVENOUS | Status: AC | PRN
  Administered 2021-12-05: 75 mL via INTRAVENOUS

## 2021-12-05 NOTE — Progress Notes (Signed)
Patient is currently in sinus - bradycardia.   Spoke to RN over the phone who confirms that he is currently on the maximum dose of diltiazem IV drip (15 mg/h) and amiodarone drip.  Recommended reducing the diltiazem drip to 10 milligrams per hour.  Half hour later start oral diltiazem 60 mg every 6 hours.  Wean diltiazem drip off overnight.  Will order an EKG in the morning.  We will still keep him n.p.o. after midnight just in case if he were to revert back to atrial flutter.  Call if questions or concerns arise.   Rex Kras, [place], [facility]  Pager: [phone] Office: [phone]

## 2021-12-05 NOTE — ED Triage Notes (Signed)
Patient presents to ed via [facility] states he wears a fit bit and noitced his heartrate was 150 @2pm  yest. States he started having chest pain around 4pm  Took an extra Toprol and took 975 of ASA. States he was able to sleep last night woke up this am and still wasn't feeling right. Called 911. Patient was given NTG x 1 and Aofran 4 mg per ems.

## 2021-12-05 NOTE — ED Notes (Signed)
This RN changed pts heparin gtt to 11.5

## 2021-12-05 NOTE — ED Provider Notes (Signed)
Who assumed care of the patient from Dr. Pecola Leisure at 7 AM.  Patient with significant history of A. fib RVR, PE who was supposed to be on lifelong anticoagulation but has not been taking Eliquis for months now presenting today with sudden onset of palpitations and chest discomfort.  Patient's heart rate has been 150 consistently since arrival despite being on diltiazem drip.  He is anticoagulated with heparin at this time.  CTA shows chronic PE but no new findings.  I independently evaluated the patient's CTA and interpreted that it did not have any new pulmonary component with fluid or infection.  Radiology noted the chronic PE.  Spoke with Dr. Odis Hollingshead with cardiology who reported that patient can eat.  Recommended an amiodarone drip which patient was changed to and diltiazem was discontinued.  Given patient's increased morbidity and mortality from this illness, multiple medical issues feel that he meets criteria for admission to the hospital.  CRITICAL CARE Performed by: Cyril Railey Total critical care time: 30 minutes Critical care time was exclusive of separately billable procedures and treating other patients. Critical care was necessary to treat or prevent imminent or life-threatening deterioration. Critical care was time spent personally by me on the following activities: development of treatment plan with patient and/or surrogate as well as nursing, discussions with consultants, evaluation of patient's response to treatment, examination of patient, obtaining history from patient or surrogate, ordering and performing treatments and interventions, ordering and review of laboratory studies, ordering and review of radiographic studies, pulse oximetry and re-evaluation of patient's condition.    Gwyneth Sprout, MD 12/05/21 [phone]

## 2021-12-05 NOTE — ED Provider Notes (Signed)
[facility] EMERGENCY DEPARTMENT Provider Note   CSN: [id number] Arrival date & time: 12/05/21  0535     History  Chief Complaint  Patient presents with   Irregular Heart Beat    Carl [name] is a 66 y.o. [name].  The history is provided by the patient and medical records.  Carl [name] is a 66 y.o. [name] who presents to the Emergency Department complaining of A. fib.  He presents emergency department complaining of palpitations and feeling like he is in atrial fibrillation since noon yesterday.  He has associated chest pressure and nausea.  Symptoms worsened overnight and he called EMS.  He had similar episode of A. fib in November 2021 and was admitted to the hospital.  He had been discharged home on Eliquis and self discontinued the medication after taking it for 8 months.  No recent illnesses.  He states that he does forget his medications from time to time but did not forget his metoprolol yesterday.  Patient states that he discontinue the Eliquis after conversation with hematology.  It appears on record review that he was on Eliquis for both history of pulmonary embolism as well as atrial fibrillation.  It was felt that he can discontinue the PE dosing but it was recommended that he continue on the A. fib dosing for primary stroke prevention.  He does drink alcohol regularly.  He states that this does feel different than his prior admission for PE with A. fib.  He sees Dr. Alvy [name] with cardiology.    Home Medications Prior to Admission medications   Medication Sig Start Date End Date Taking? Authorizing Provider  allopurinol (ZYLOPRIM) 100 MG tablet Take 100 mg by mouth in the morning.  02/20/20   [provider]  apixaban (ELIQUIS) 5 MG TABS tablet Take 1 tablet (5 mg total) by mouth 2 (two) times daily. 09/26/20   Dessa Phi, DO  aspirin EC 81 MG EC tablet Take 1 tablet (81 mg total) by mouth daily. Swallow whole. 09/27/20   Dessa Phi, DO   atorvastatin (LIPITOR) 10 MG tablet Take 10 mg by mouth daily.    [provider]  Lactobacillus Rhamnosus, GG, (RA PROBIOTIC DIGESTIVE CARE) CAPS Take 1 capsule by mouth in the morning.    [provider]  metoprolol succinate (TOPROL-XL) 50 MG 24 hr tablet Take 1 tablet (50 mg total) by mouth daily. Take with or immediately following a meal. 02/04/21   Cantwell, Celeste C, PA-C  spironolactone (ALDACTONE) 25 MG tablet TAKE 1 TABLET (25 MG TOTAL) BY MOUTH DAILY. 11/05/21   Cantwell, Celeste C, PA-C  tadalafil (CIALIS) 20 MG tablet Take 20 mg by mouth daily as needed for erectile dysfunction.  02/17/20   [provider]      Allergies    Patient has no known allergies.    Review of Systems   Review of Systems  All other systems reviewed and are negative.  Physical Exam Updated Vital Signs BP (!) 126/96    Pulse (!) 157    Temp 97.8 F (36.6 C) (Oral)    Resp (!) 24    Ht 5\' 10"  (1.778 m)    Wt 106.1 kg    SpO2 96%    BMI 33.58 kg/m  Physical Exam Vitals and nursing note reviewed.  Constitutional:      Appearance: He is well-developed.  HENT:     Head: Normocephalic and atraumatic.  Cardiovascular:     Rate and Rhythm: Tachycardia present. Rhythm  irregular.     Heart sounds: No murmur heard. Pulmonary:     Effort: Pulmonary effort is normal. No respiratory distress.     Breath sounds: Normal breath sounds.  Abdominal:     Palpations: Abdomen is soft.     Tenderness: There is no abdominal tenderness. There is no guarding or rebound.  Musculoskeletal:        General: No tenderness.     Comments: Trace edema to bilateral lower extremities  Skin:    General: Skin is warm and dry.  Neurological:     Mental Status: He is alert and oriented to person, place, and time.  Psychiatric:        Behavior: Behavior normal.    ED Results / Procedures / Treatments   Labs (all labs ordered are listed, but only abnormal results are displayed) Labs Reviewed   COMPREHENSIVE METABOLIC PANEL - Abnormal; Notable for the following components:      Result Value   Potassium 5.2 (*)    Glucose, Bld 109 (*)    Total Protein 6.0 (*)    Albumin 3.2 (*)    Total Bilirubin 1.6 (*)    All other components within normal limits  RESP PANEL BY RT-PCR (FLU A&B, COVID) ARPGX2  CBC WITH DIFFERENTIAL/PLATELET  MAGNESIUM  HEPARIN LEVEL (UNFRACTIONATED)  TROPONIN I (HIGH SENSITIVITY)    EKG EKG Interpretation  Date/Time:  Thursday December 05 2021 05:45:07 EST Ventricular Rate:  153 PR Interval:    QRS Duration: 78 QT Interval:  158 QTC Calculation: 252 R Axis:   -37 Text Interpretation: Atrial flutter with 2:1 A-V conduction Left axis deviation Pulmonary disease pattern Nonspecific ST and T wave abnormality Abnormal ECG When compared with ECG of 23-Sep-2020 22:07, PREVIOUS ECG IS PRESENT Confirmed by Quintella Reichert [phone]) on 12/05/2021 6:10:48 AM  Radiology DG Chest Port 1 View  Result Date: 12/05/2021 CLINICAL DATA:  Chest pain.  Atrial fibrillation EXAM: PORTABLE CHEST 1 VIEW COMPARISON:  Chest CT 09/23/2020 FINDINGS: Low volume chest with interstitial crowding, unchanged. Generous heart size accentuated by mediastinal fat on prior study. There was also a pericardial effusion or thickening on prior. Artifact from EKG leads. There is no edema, consolidation, effusion, or pneumothorax. IMPRESSION: No acute finding when compared to 2021. Electronically Signed   By: Jorje Guild M.D.   On: 12/05/2021 06:41    Procedures Procedures   CRITICAL CARE Performed by: Quintella Reichert   Total critical care time: 45 minutes  Critical care time was exclusive of separately billable procedures and treating other patients.  Critical care was necessary to treat or prevent imminent or life-threatening deterioration.  Critical care was time spent personally by me on the following activities: development of treatment plan with patient and/or surrogate as well as  nursing, discussions with consultants, evaluation of patient's response to treatment, examination of patient, obtaining history from patient or surrogate, ordering and performing treatments and interventions, ordering and review of laboratory studies, ordering and review of radiographic studies, pulse oximetry and re-evaluation of patient's condition.  Medications Ordered in ED Medications  diltiazem (CARDIZEM) 1 mg/mL load via infusion 15 mg (15 mg Intravenous Bolus from Bag 12/05/21 0614)    And  diltiazem (CARDIZEM) 125 mg in dextrose 5% 125 mL (1 mg/mL) infusion (15 mg/hr Intravenous Rate/Dose Change 12/05/21 0741)  heparin ADULT infusion 100 units/mL (25000 units/270mL) (has no administration in time range)  heparin bolus via infusion 4,000 Units (has no administration in time range)  ondansetron (ZOFRAN) injection 4  mg (4 mg Intravenous Given 12/05/21 0746)    ED Course/ Medical Decision Making/ A&P                           Medical Decision Making Amount and/or Complexity of Data Reviewed Labs: ordered. Radiology: ordered.  Risk Prescription drug management.   Pt with hx/o unprovoked PE, afib not currently on anticoagulation here for evaluation of chest pressure, palpitations.  EKG with rapid atrial flutter.  He was started on cardizem for rate control.  Given similar presenting sxs with prior PE will check CTA to r/o recurrent PE.    No significant change in rate with cardizem, pt does report symptomatic improvement.  D/w Dr. Terri Skains with Cards - recommends heparin gtt.  Does not recommend cardioversion.  If rate remains uncontrolled may consider amio bolus after heparin has been running for at least an hour.    Pt care transferred pending CTA.    CXR personally reviewed - no acute change when compared to priors.   Bmp with hyperkalemia but hemolyzed specimen, otherwise no significant abnormality.    Mag wnl.          Final Clinical Impression(s) / ED Diagnoses Final  diagnoses:  None    Rx / DC Orders ED Discharge Orders     None         Quintella Reichert, MD 12/05/21 [phone]

## 2021-12-05 NOTE — Consult Note (Addendum)
CARDIOLOGY CONSULT NOTE  Patient ID: Carl [name] MRN: [id number] DOB/AGE: [birth year] 67 y.o.  Admit date: 12/05/2021 Referring Physician  Blanchie Dessert, MD Primary Physician:  Merwyn Katos Reason for Consultation: Atrial flutter  Patient ID: Carl [name], male    DOB: September 16, 1956, 66 y.o.   MRN: [id number]  Chief Complaint  Patient presents with   Irregular Heart Beat   HPI:    Carl [name]  is a 66 y.o. male with past medical history of hypertension, hyperlipidemia, tobacco use disorder.   Admitted and diagnosed with atrial flutter 08/2020 at which time he was started on Eliquis and underwent direct current cardioversion. He was again admitted 09/2020 with pneumonia, pulmonary embolism, atrial flutter, and LVEF 25-30%. Cardiac catheterization at that time revealed normal coronary arteries. Previously recommended antiarrythmic therapy however patient opted to hold off. Repeat echocardiogram 11/2020 showed improved LVEF at 66% with patient maintaining sinus rhythm. Patient was last seen in our office outpatient 05/16/2021 at which time he was maintaining sinus rhythm and tolerating anticoagulation without issue.   Patient now presents to [facility] ED with complaints of shortness of breath and palpitations. Upon further questioning patient admits he independently discontinued Eliuqis in 06/2021. Evaluation in the emergency department as follows: EKG in the ED revealed atypical atrial flutter with 2: 1 conduction at a rate of 153 bpm.  CMP with calcium of 5.2, initial troponin negative, CBC unremarkable.  Chest x-ray unremarkable.  CT angio of the chest revealed chronic right upper lobe PE, 4.3 cm ascending thoracic aorta, and coronary atherosclerosis.   Cardiology was consulted by ED physician.  Recommended holding off on cardioversion in the ED as patient has not been on anticoagulation.  As LVEF had normalized on last echocardiogram recommended initiation of diltiazem drip  for rate control, however patient's heart rate remained in the 150s, therefore advised addition of amiodarone infusion.   Patient was seen and examined at bedside at approximately 12:00 PM today.  Patient reports he had been feeling well without known recurrence of atrial flutter until yesterday around noon when he was working in his garage and noticed his heart rate to be elevated in the 150s per his fitness watch.  Patient was asymptomatic at that time, therefore continue to monitor his heart rate with his watch and pulse oximeter throughout the day.  Last night he woke up in the middle of the night and noted his heart rate to be approximately 160 bpm.  Patient developed chest pressure and nausea which prompted him to call EMS who transported him to the ED.   Since last night patient has had no recurrence of chest pain or nausea.  Denies dyspnea, palpitations, dizziness, syncope, near syncope.  Patient is presently both on diltiazem and amiodarone infusions, heart rate remains in the 150s and blood pressure is soft.   Patient notes he had foam insulation installed in his house 2 days ago, which he notes caused coughing fits. Patient expresses concern that this contributed to his current presentation.   Past Medical History:  Diagnosis Date   Hypercholesteremia    Primary hypertension    Pulmonary embolism ([place]) 08/27/2020   Typical atrial flutter ([place]) 08/27/2020 with PE   Past Surgical History:  Procedure Laterality Date   IR THORACENTESIS ASP PLEURAL SPACE W/IMG GUIDE  09/20/2020   LAPAROTOMY     LEFT HEART CATH AND CORONARY ANGIOGRAPHY N/A 09/25/2020   Procedure: LEFT HEART CATH AND CORONARY ANGIOGRAPHY;  Surgeon: Nigel Mormon, MD;  Location: [facility]  INVASIVE CV LAB;  Service: Cardiovascular;  Laterality: N/A;   Family History  Problem Relation Age of Onset   CAD Mother 64   Heart attack Father    Cancer Sister        lung   Heart disease Brother    Heart failure Brother     Heart attack Brother    Social History   Tobacco Use   Smoking status: Some Days    Types: Cigars   Smokeless tobacco: Never   Tobacco comments:    Once a week  Substance Use Topics   Alcohol use: Yes    Alcohol/week: 1.0 standard drink    Types: 1 Glasses of wine per week    Comment: Occasional    Marital Sttus: Widowed  ROS  Review of Systems  Constitutional: Negative for malaise/fatigue and weight gain.  Cardiovascular:  Positive for chest pain (single episode last night). Negative for claudication, leg swelling, near-syncope, orthopnea, palpitations, paroxysmal nocturnal dyspnea and syncope.  Respiratory:  Negative for shortness of breath.   Gastrointestinal:  Positive for nausea.  Neurological:  Negative for dizziness.  All other systems reviewed and are negative.  Objective   Vitals with BMI 12/05/2021 12/05/2021 12/05/2021  Height - - -  Weight - - -  BMI - - -  Systolic 0000000 [id number]  Diastolic 82 61 90  Pulse [id number] 156 153    Blood pressure 128/82, pulse (!) 157, temperature 97.8 F (36.6 C), temperature source Oral, resp. rate (!) 21, height 5\' 10"  (1.778 m), weight 106.1 kg, SpO2 97 %.    Physical Exam Vitals reviewed.  HENT:     Head: Normocephalic and atraumatic.     Right Ear: External ear normal.     Left Ear: External ear normal.     Nose: Nose normal.     Mouth/Throat:     Mouth: Mucous membranes are moist.  Eyes:     Extraocular Movements: Extraocular movements intact.     Conjunctiva/sclera: Conjunctivae normal.  Cardiovascular:     Rate and Rhythm: Regular rhythm. Tachycardia present.     Pulses: Intact distal pulses.     Heart sounds: S1 normal and S2 normal. No murmur heard.   No gallop.  Pulmonary:     Effort: Pulmonary effort is normal. No respiratory distress.     Breath sounds: No wheezing, rhonchi or rales.     Comments: 2 L/min via [place]  Abdominal:     General: Bowel sounds are normal.     Palpations: Abdomen is soft.     Tenderness:  There is no abdominal tenderness.  Musculoskeletal:     Right lower leg: No edema.     Left lower leg: No edema.  Skin:    General: Skin is warm and dry.  Neurological:     Mental Status: He is alert. Mental status is at baseline.    Laboratory examination:   Recent Labs    12/05/21 0548  NA 139  K 5.2*  CL 105  CO2 26  GLUCOSE 109*  BUN 11  CREATININE 0.82  CALCIUM 9.0  GFRNONAA >60   estimated creatinine clearance is 109.5 mL/min (by C-G formula based on SCr of 0.82 mg/dL).  CMP Latest Ref Rng & Units 12/05/2021 09/26/2020 09/25/2020  Glucose 70 - 99 mg/dL 109(H) 89 116(H)  BUN 8 - 23 mg/dL 11 11 14   Creatinine 0.61 - 1.24 mg/dL 0.82 0.88 0.94  Sodium 135 - 145 mmol/L 139 137 136  Potassium 3.5 - 5.1 mmol/L 5.2(H) 3.9 4.0  Chloride 98 - 111 mmol/L 105 99 95(L)  CO2 22 - 32 mmol/L 26 33(H) 32  Calcium 8.9 - 10.3 mg/dL 9.0 8.7(L) 9.0  Total Protein 6.5 - 8.1 g/dL 6.0(L) - -  Total Bilirubin 0.3 - 1.2 mg/dL 1.6(H) - -  Alkaline Phos 38 - 126 U/L 59 - -  AST 15 - 41 U/L 33 - -  ALT 0 - 44 U/L 15 - -   CBC Latest Ref Rng & Units 12/05/2021 09/26/2020 09/23/2020  WBC 4.0 - 10.5 K/uL 8.2 11.1(H) 10.7(H)  Hemoglobin 13.0 - 17.0 g/dL 15.2 12.8(L) 12.6(L)  Hematocrit 39.0 - 52.0 % 44.5 40.5 38.3(L)  Platelets 150 - 400 K/uL 244 349 369   Lipid Panel No results for input(s): CHOL, TRIG, LDLCALC, VLDL, HDL, CHOLHDL, LDLDIRECT in the last 8760 hours.  HEMOGLOBIN A1C No results found for: HGBA1C, MPG TSH No results for input(s): TSH in the last 8760 hours. BNP (last 3 results) No results for input(s): BNP in the last 8760 hours. Results for orders placed or performed during the hospital encounter of 12/05/21 (from the past 48 hour(s))  CBC with Differential     Status: None   Collection Time: 12/05/21  5:48 AM  Result Value Ref Range   WBC 8.2 4.0 - 10.5 K/uL   RBC 4.58 4.22 - 5.81 MIL/uL   Hemoglobin 15.2 13.0 - 17.0 g/dL   HCT 44.5 39.0 - 52.0 %   MCV 97.2 80.0 -  100.0 fL   MCH 33.2 26.0 - 34.0 pg   MCHC 34.2 30.0 - 36.0 g/dL   RDW 12.5 11.5 - 15.5 %   Platelets 244 150 - 400 K/uL   nRBC 0.0 0.0 - 0.2 %   Neutrophils Relative % 67 %   Neutro Abs 5.5 1.7 - 7.7 K/uL   Lymphocytes Relative 19 %   Lymphs Abs 1.5 0.7 - 4.0 K/uL   Monocytes Relative 11 %   Monocytes Absolute 0.9 0.1 - 1.0 K/uL   Eosinophils Relative 2 %   Eosinophils Absolute 0.1 0.0 - 0.5 K/uL   Basophils Relative 1 %   Basophils Absolute 0.1 0.0 - 0.1 K/uL   Immature Granulocytes 0 %   Abs Immature Granulocytes 0.03 0.00 - 0.07 K/uL    Comment: Performed at [facility], [address], [place], [place] [address]  Comprehensive metabolic panel     Status: Abnormal   Collection Time: 12/05/21  5:48 AM  Result Value Ref Range   Sodium 139 135 - 145 mmol/L   Potassium 5.2 (H) 3.5 - 5.1 mmol/L    Comment: SPECIMEN HEMOLYZED. HEMOLYSIS MAY AFFECT INTEGRITY OF RESULTS.   Chloride 105 98 - 111 mmol/L   CO2 26 22 - 32 mmol/L   Glucose, Bld 109 (H) 70 - 99 mg/dL    Comment: Glucose reference range applies only to samples taken after fasting for at least 8 hours.   BUN 11 8 - 23 mg/dL   Creatinine, Ser 0.82 0.61 - 1.24 mg/dL   Calcium 9.0 8.9 - 10.3 mg/dL   Total Protein 6.0 (L) 6.5 - 8.1 g/dL   Albumin 3.2 (L) 3.5 - 5.0 g/dL   AST 33 15 - 41 U/L   ALT 15 0 - 44 U/L   Alkaline Phosphatase 59 38 - 126 U/L   Total Bilirubin 1.6 (H) 0.3 - 1.2 mg/dL   GFR, Estimated >60 >60 mL/min  Comment: (NOTE) Calculated using the CKD-EPI Creatinine Equation (2021)    Anion gap 8 5 - 15    Comment: Performed at [facility], [place] [address] [place], [place] [address]  Troponin I (High Sensitivity)     Status: None   Collection Time: 12/05/21  5:48 AM  Result Value Ref Range   Troponin I (High Sensitivity) 16 <18 ng/L    Comment: (NOTE) Elevated high sensitivity troponin I (hsTnI) values and significant  changes across serial measurements may suggest ACS but many other   chronic and acute conditions are known to elevate hsTnI results.  Refer to the Links section for chest pain algorithms and additional  guidance. Performed at [facility], [place] [address] [place], [place] [address]   Magnesium     Status: None   Collection Time: 12/05/21  5:48 AM  Result Value Ref Range   Magnesium 2.0 1.7 - 2.4 mg/dL    Comment: Performed at [facility] [address], [place], [place] [address]    Medications and allergies  No Known Allergies   No outpatient medications have been marked as taking for the 12/05/21 encounter [facility] Encounter).    Scheduled Meds: Continuous Infusions:  amiodarone 60 mg/hr (12/05/21 [id number])   Followed by   amiodarone     heparin 1,350 Units/hr (12/05/21 0759)   PRN Meds:.   No intake/output data recorded. Total I/O In: -  Out: 72 [Urine:350]    Radiology:  CT Angio Chest PE W and/or Wo Contrast.  Date: 08/27/2020 Acute segmental and subsegmental pulmonary emboli bilaterally. No evidence of right heart strain. Patchy bilateral atelectasis.   CT Angio Chest PE W/Cm &/Or Wo Cm 12/05/2021 1. Chronic-appearing RIGHT upper lobe apical segmental pulmonary embolus. No additional evidence to suggest acute segmental or larger pulmonary emboli.  2. Main pulmonary artery dilation, likely reflecting underlying pulmonary arterial hypertension.  3. 4.3 cm ascending thoracic aorta. Recommend annual imaging followup by CTA or MRA. 4. Coronary atherosclerosis, with greatest burden within the LAD.  5.  Additional incidental, chronic and senescent findings as above.   DG Chest Port 1 View: 12/05/2021 Low volume chest with interstitial crowding, unchanged. Generous heart size accentuated by mediastinal fat on prior study. There was also a pericardial effusion or thickening on prior. Artifact from EKG leads. There is no edema, consolidation, effusion, or pneumothorax.  IMPRESSION: No acute finding when compared to 2021.    Cardiac Studies:   Lower Extremity Venous Duplex  08/28/20   +---------+---------------+---------+-----------+----------+--------------+     Summary: BILATERAL: - No evidence of deep vein thrombosis seen in the lower extremities, bilaterally. - No evidence of superficial venous thrombosis in the lower extremities, bilaterally. -   *See table(s) above for measurements and observations.    Preliminary     Coronary angiography 09/25/2020 LM: Normal LAD: Type 1 LAD that does not reach apex          Medial calcification without any luminal stenosis         Large Diag 1 that reaches apex and also gives off septal perforators, making it a dual LAD system          No other diagonals present LCx: Normal Ramus: Normal RCA: Large, gives of several septal perforators, reaches apex         Minimal luminal irregularities No significant coronary artery disease   PCV ECHOCARDIOGRAM COMPLETE [id number] Normal LV systolic function with EF 66%. Left ventricle cavity is normal in size.  Normal global wall motion. Normal diastolic filling pattern. Calculated EF 66%. Left atrial cavity is mildly dilated at 4.8 cm. Structurally normal tricuspid valve. No evidence of tricuspid stenosis. Mild tricuspid regurgitation. No evidence of pulmonary hypertension. Compared to the study done on 09/24/2020, ejection fraction has improved to normal. Compared to prior echocardiogram on 08/28/2020 where her EF was 50 to 55% with moderate pulmonary hypertension, pulm hypertension no longer present.  EKG:  12/05/2020: Atypical atrial flutter with 2:1 conduction at a rate of 153 bpm.   Assessment   Carl [name]  is a 66 y.o. male with past medical history of hypertension, hyperlipidemia, tobacco use disorder, paroxysmal atrial flutter/fib, PE (08/27/2020).  Who presents to [facility] ED with recurrence of atrial flutter with RVR.   Atrial flutter with rapid ventriclar response Hypertension  Hyperlipidemia  Chronic  pulmonary embolism    Recommendations:   Atrial flutter with rapid ventriclar response This patients CHA2DS2-VASc Score 3(HTN, Vasc, Age)  and yearly risk of stroke 3.2%.  Patient has not been taking Eliquis since August 2022, he has been started on heparin since presentation.  Given the patient has not been on long-term anticoagulation we will hold off on direct-current cardioversion at this time.  We will continue diltiazem and amiodarone infusions.  If patient rate control does not improve or he does not convert to sinus rhythm with diltiazem and amiodarone infusions we will tentatively plan for TEE cardioversion tomorrow morning. Reviewed and discussed with patient indication, risk, benefits of TEE and cardioversion, he verbalized understanding and patient agrees to proceed with the same if rate does not improve or he does not convert to sinus rhythm.  Verified patient's emergency contacts including his daughter Benjamine Mola will be and friend Wandra Arthurs. Patient requests that Wandra Arthurs be his primary contact at [phone].  TSH pending  Echocardiogram pending.   Hypertension  Patient's blood pressure is within acceptable range with IV diltiazem.  Agree with holding home metoprolol and spironolactone for now. Can consider restarting if additional antihypertensives needed.  Will continue to monitor  Hyperlipidemia  Continue statin therapy  Chronic pulmonary embolism  Patient had been advised by hematology he could discontinue Eliquis from a PE standpoint. However, given chronic PE and atrial flutter would recommend continued anticoagulation.    Patient was seen in collaboration with Dr. Terri Skains. He also reviewed patient's chart and examined the patient. Dr. Terri Skains is in agreement of the plan.    Alethia Berthold, PA-C 12/05/2021, 10:14 AM Office: [phone]   ADDENDUM: Patient seen and examined independently. Note reviewed with the Lawerance Cruel, PA and relevant changes  were made to her note if needed.  I personally reviewed laboratory data, imaging studies and relevant notes.  I independently examined the patient and formulated the important aspects of the plan.  I have personally discussed the plan with the patient.  Comments or changes to the note/plan are indicated below.  My Exam:  Vitals with BMI 12/05/2021 12/05/2021 12/05/2021  Height - - -  Weight - - -  BMI - - -  Systolic 0000000 [id number]  Diastolic 76 84 [id number]  Pulse 152 152 161    CONSTITUTIONAL: Age-appropriate, hemodynamically stable, No acute distress.  SKIN: Skin is warm and dry. No rash noted. No cyanosis. No pallor. No jaundice HEAD: Normocephalic and atraumatic.  EYES: No scleral icterus MOUTH/THROAT: Moist oral membranes.  NECK: No JVD present. No thyromegaly noted. No carotid bruits  LYMPHATIC: No visible cervical adenopathy.  CHEST Normal respiratory effort.  No intercostal retractions  LUNGS: Clear to auscultation bilaterally.  No stridor. No wheezes. No rales.  CARDIOVASCULAR: Irregularly irregular, variable S1-S2, no murmurs rubs or gallops appreciated. ABDOMINAL: Obese, soft, nontender, nondistended, positive bowel sounds in all 4 quadrants, no apparent ascites.  EXTREMITIES: No peripheral edema, warm to touch, 2+ DP and PT pulses. HEMATOLOGIC: No significant bruising NEUROLOGIC: Oriented to person, place, and time. Nonfocal. Normal muscle tone.  PSYCHIATRIC: Normal mood and affect. Normal behavior. Cooperative   Assessment & Plan:  Atrial flutter with rapid ventricular rate Symptomatic CHA2DS2-VASc score 3 Not on oral anticoagulation Remains in the rapid ventricular rate despite being on diltiazem drip and amiodarone drip. If the patient converts to normal sinus rhythm on parenteral medications we will convert him to oral meds tomorrow.  However, if he continues to be in rapid ventricular rate despite being on parenteral medications for 24 hours and being symptomatic and we  will proceed with TEE guided cardioversion.  After careful review of history and examination, the risks, benefits of transesophageal echocardiogram, and alternatives have been explained to the patient. Complications include but not limited to esophageal perforation (rare), gastrointestinal bleeding (rare), cardiac arrhythmia which can include cardiac arrest and death (rare), pharyngeal irritation / discomfort with swallowing / hematoma, methemoglobinemia, bronchospasm, transient hypoxia, nonsustained ventricular tachycardia, transient atrial fibrillation, minimal hemoptysis, vomiting, hypotension, respiratory compromise, reaction to medications, unavoidable damage to teeth and gums, aspiration pneumonia  were reviewed with the patient.  Patient voices understands, provides verbal feedback, questions answered, and patient  wishes to proceed with the procedure.  Risks, benefits, and alternatives of direct current cardioversion reviewed with the and patient. Risk includes but not limited to: potential for post-cardioversion rhythms, life-threatening arrhythmias (ventricular tachycardia and fibrillation, profound bradycardia, cardiac arrest), myocardial damage, acute pulmonary edema, skin burns, transient hypotension. Benefits include restoration of sinus rhythm. Alternatives to treatment were discussed, questions were answered, patient voices understanding and provides verbal feedback.  Patient is willing to proceed.   Plan of care discussed with attending physician as well.  Time Spent with Patient: I have spent a total of 85 minutes with patient reviewing hospital notes, telemetry, EKGs, labs and examining the patient as well as establishing an assessment and plan that was discussed with the patient.  > 50% of time was spent in direct patient care.  This note was created using a voice recognition software as a result there may be grammatical errors inadvertently enclosed that do not reflect the nature of  this encounter. Every attempt is made to correct such errors.  Signed, Mechele [facility]  Pager: [phone] Office: [phone] 12/05/2021 5:10 PM
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
## 2021-12-05 NOTE — Progress Notes (Signed)
Echocardiogram 2D Echocardiogram has been performed.  [name] 12/05/2021, 2:09 PM

## 2021-12-05 NOTE — Progress Notes (Signed)
ANTICOAGULATION CONSULT NOTE - Follow Up Consult  Pharmacy Consult for Heparin IV Indication: atrial fibrillation and VTE prophylaxis. Remote history of PE  No Known Allergies  Patient Measurements: Height: 5\' 10"  (177.8 cm) Weight: 106.1 kg (234 lb) IBW/kg (Calculated) : 73 Heparin Dosing Weight: 95.7 kg  Vital Signs: Temp: 97.8 F (36.6 C) (01/26 0555) Temp Source: Oral (01/26 0555) BP: 124/87 (01/26 0630) Pulse Rate: 155 (01/26 0630)  Labs: Recent Labs    12/05/21 0548  HGB 15.2  HCT 44.5  PLT 244  CREATININE 0.82  TROPONINIHS 16    Estimated Creatinine Clearance: 109.5 mL/min (by C-G formula based on SCr of 0.82 mg/dL).   Medications:  Infusions:   diltiazem (CARDIZEM) infusion 10 mg/hr (12/05/21 [id number])   heparin      Assessment: 66 y.o. male who presents to the Emergency Department complaining of A. fib. He had similar episode of A. fib in November 2021 and was admitted to the hospital.  He had been discharged home on Eliquis and self discontinued the medication after taking it for 8 months.  It was recommended that he continue on the A. fib dosing for primary stroke prevention.  H/H 15.2/44.5 Platelets 244   Goal of Therapy:  Heparin level 0.3-0.7 units/ml Monitor platelets by anticoagulation protocol: Yes   Plan:  Give heparin 4000 units x1 bolus followed by heparin drip at 1350 units/hr  Heparin level at 1400 Daily heparin level and CBC ordered Monitor for signs/symptoms of bleed Follow up with long term anticoagulation plan  Thank you for allowing pharmacy to be a part of this patients care.  Donnald Garre, PharmD Clinical Pharmacist  Please check AMION for all [place] numbers After 10:00 PM, call [place] [phone]

## 2021-12-05 NOTE — Progress Notes (Signed)
ANTICOAGULATION CONSULT NOTE Pharmacy Consult for Heparin  Indication: atrial fibrillation Brief A/P: Heparin level subtherapeutic Increase Heparin rate  No Known Allergies  Patient Measurements: Height: 5\' 10"  (177.8 cm) Weight: 106.1 kg (234 lb) IBW/kg (Calculated) : 73 Heparin Dosing Weight: 95.7 kg  Vital Signs: Temp: 98.3 F (36.8 C) (01/26 2019) Temp Source: Oral (01/26 2019) BP: 102/63 (01/26 2202) Pulse Rate: 54 (01/26 2019)  Labs: Recent Labs    12/05/21 0548 12/05/21 1400 12/05/21 1447 12/05/21 2226  HGB 15.2  --   --   --   HCT 44.5  --   --   --   PLT 244  --   --   --   HEPARINUNFRC  --  0.83*  --  0.28*  CREATININE 0.82  --  0.83  --   TROPONINIHS 16  --   --   --      Estimated Creatinine Clearance: 108.2 mL/min (by C-G formula based on SCr of 0.83 mg/dL).  Assessment: 66 y.o. male with Afib for heparin  Goal of Therapy:  Heparin level 0.3-0.7 units/ml Monitor platelets by anticoagulation protocol: Yes   Plan:  Increase Heparin 1250 units/hr  76, PharmD, BCPS

## 2021-12-05 NOTE — ED Notes (Signed)
Received verbal report from Kelly M RN at this time 

## 2021-12-05 NOTE — ED Notes (Signed)
Patient transported to CT, per MD okay to pause Cardizem while in scanner

## 2021-12-05 NOTE — Progress Notes (Signed)
ANTICOAGULATION CONSULT NOTE - Follow Up Consult  Pharmacy Consult for Heparin IV Indication: atrial fibrillation and VTE prophylaxis. Remote history of PE  No Known Allergies  Patient Measurements: Height: 5\' 10"  (177.8 cm) Weight: 106.1 kg (234 lb) IBW/kg (Calculated) : 73 Heparin Dosing Weight: 95.7 kg  Vital Signs: Temp: 97.8 F (36.6 C) (01/26 0555) Temp Source: Oral (01/26 0555) BP: 109/76 (01/26 1345) Pulse Rate: 152 (01/26 1345)  Labs: Recent Labs    12/05/21 0548 12/05/21 1400  HGB 15.2  --   HCT 44.5  --   PLT 244  --   HEPARINUNFRC  --  0.83*  CREATININE 0.82  --   TROPONINIHS 16  --      Estimated Creatinine Clearance: 109.5 mL/min (by C-G formula based on SCr of 0.82 mg/dL).   Medications:  Infusions:   amiodarone     diltiazem (CARDIZEM) infusion 15 mg/hr (12/05/21 1432)   heparin 1,350 Units/hr (12/05/21 0759)    Assessment: 66 y.o. male who presents to the Emergency Department complaining of A. fib. He had similar episode of A. fib in November 2021 and was admitted to the hospital.  He had been discharged home on Eliquis and self discontinued the medication after taking it for 8 months.  It was recommended that he continue on the A. fib dosing for primary stroke prevention.  Heparin started at 1350 units/hr following a bolus. Six hour level back at 0.83 which is supra-therapeutic. No bleeding or issues with infusion per RN.  H/H 15.2/44.5 Platelets 244   Goal of Therapy:  Heparin level 0.3-0.7 units/ml Monitor platelets by anticoagulation protocol: Yes   Plan:  Decrease heparin drip to 1150 units/hr Repeat HL in 6 hours Daily heparin level and CBC ordered Monitor for signs/symptoms of bleed Follow up with long term anticoagulation plan  Lorelei Pont, PharmD, BCPS 12/05/2021 3:17 PM ED Clinical Pharmacist -  [phone]

## 2021-12-05 NOTE — Anesthesia Preprocedure Evaluation (Deleted)
Anesthesia Evaluation    Reviewed: Allergy & Precautions, Patient's Chart, lab work & pertinent test results  Airway        Dental   Pulmonary Current Smoker, PE          Cardiovascular hypertension, Pt. on home beta blockers and Pt. on medications +CHF  + dysrhythmias Atrial Fibrillation   TTE 2023 1. Left ventricular ejection fraction, by estimation, is 60 to 65%. The  left ventricle has normal function. The left ventricle has no regional  wall motion abnormalities. There is moderate concentric left ventricular  hypertrophy. Left ventricular  diastolic parameters are indeterminate.  2. Right ventricular systolic function is normal. The right ventricular  size is normal. Tricuspid regurgitation signal is inadequate for assessing  PA pressure.  3. The mitral valve is normal in structure. No evidence of mitral valve  regurgitation. No evidence of mitral stenosis.  4. The aortic valve is normal in structure. Aortic valve regurgitation is  not visualized. No aortic stenosis is present.  5. Aortic dilatation noted. There is mild dilatation of the aortic root,  measuring 38 mm. There is mild dilatation of the ascending aorta,  measuring 40 mm.  6. The inferior vena cava is normal in size with greater than 50%  respiratory variability, suggesting right atrial pressure of 3 mmHg.    Neuro/Psych negative neurological ROS  negative psych ROS   GI/Hepatic negative GI ROS, Neg liver ROS,   Endo/Other  negative endocrine ROS  Renal/GU negative Renal ROS  negative genitourinary   Musculoskeletal negative musculoskeletal ROS (+)   Abdominal   Peds  Hematology negative hematology ROS (+)   Anesthesia Other Findings Admitted and diagnosed with atrial flutter 08/2020 at which time he was started on Eliquis and underwent direct current cardioversion. He was again admitted 09/2020 with pneumonia, pulmonary embolism, atrial  flutter, and LVEF 25-30%. Cardiac catheterization at that time revealed normal coronary arteries.  Repeat echocardiogram 11/2020 showed improved LVEF at 66%. Now presents to [facility] ED with complaints of shortness of breath and palpitations found to be in afib with RVR.   Reproductive/Obstetrics                             Anesthesia Physical Anesthesia Plan  ASA: 3  Anesthesia Plan: MAC   Post-op Pain Management:    Induction: Intravenous  PONV Risk Score and Plan: Propofol infusion and Treatment may vary due to age or medical condition  Airway Management Planned: Natural Airway  Additional Equipment:   Intra-op Plan:   Post-operative Plan:   Informed Consent: I have reviewed the patients History and Physical, chart, labs and discussed the procedure including the risks, benefits and alternatives for the proposed anesthesia with the patient or authorized representative who has indicated his/her understanding and acceptance.     Dental advisory given  Plan Discussed with: CRNA  Anesthesia Plan Comments:         Anesthesia Quick Evaluation

## 2021-12-06 ENCOUNTER — Other Ambulatory Visit (HOSPITAL_COMMUNITY): Payer: Self-pay

## 2021-12-06 ENCOUNTER — Encounter (HOSPITAL_COMMUNITY)
Admission: EM | Disposition: A | Discharge status: Home / Self Care | Payer: Self-pay | Source: Home / Self Care | Attending: Emergency Medicine

## 2021-12-06 ENCOUNTER — Other Ambulatory Visit (HOSPITAL_COMMUNITY): Payer: Federal, State, Local not specified - PPO

## 2021-12-06 DIAGNOSIS — I10 Essential (primary) hypertension: Secondary | ICD-10-CM | POA: Diagnosis not present

## 2021-12-06 DIAGNOSIS — Z20822 Contact with and (suspected) exposure to covid-19: Secondary | ICD-10-CM | POA: Diagnosis not present

## 2021-12-06 DIAGNOSIS — I4891 Unspecified atrial fibrillation: Secondary | ICD-10-CM

## 2021-12-06 DIAGNOSIS — F17290 Nicotine dependence, other tobacco product, uncomplicated: Secondary | ICD-10-CM | POA: Diagnosis not present

## 2021-12-06 DIAGNOSIS — I2699 Other pulmonary embolism without acute cor pulmonale: Secondary | ICD-10-CM

## 2021-12-06 DIAGNOSIS — I480 Paroxysmal atrial fibrillation: Secondary | ICD-10-CM | POA: Diagnosis not present

## 2021-12-06 LAB — HEPARIN LEVEL (UNFRACTIONATED): Heparin Unfractionated: 0.29 IU/mL — ABNORMAL LOW (ref 0.30–0.70)

## 2021-12-06 LAB — CBC
HCT: 41.4 % (ref 39.0–52.0)
Hemoglobin: 14.1 g/dL (ref 13.0–17.0)
MCH: 33 pg (ref 26.0–34.0)
MCHC: 34.1 g/dL (ref 30.0–36.0)
MCV: 97 fL (ref 80.0–100.0)
Platelets: 211 10*3/uL (ref 150–400)
RBC: 4.27 MIL/uL (ref 4.22–5.81)
RDW: 12.5 % (ref 11.5–15.5)
WBC: 7.8 10*3/uL (ref 4.0–10.5)
nRBC: 0 % (ref 0.0–0.2)

## 2021-12-06 LAB — HIV ANTIBODY (ROUTINE TESTING W REFLEX): HIV Screen 4th Generation wRfx: NONREACTIVE

## 2021-12-06 SURGERY — CANCELLED PROCEDURE
Anesthesia: Monitor Anesthesia Care

## 2021-12-06 MED ORDER — AMIODARONE HCL 200 MG PO TABS
ORAL_TABLET | ORAL | 0 refills | Status: DC
  Filled 2021-12-06: qty 37, 30d supply, fill #0

## 2021-12-06 MED ORDER — AMIODARONE HCL 200 MG PO TABS
ORAL_TABLET | ORAL | 0 refills | Status: DC
  Filled 2021-12-06: qty 44, 30d supply, fill #0

## 2021-12-06 MED ORDER — DILTIAZEM HCL ER COATED BEADS 120 MG PO CP24
240.0000 mg | ORAL_CAPSULE | Freq: Every day | ORAL | 2 refills | Status: DC
  Filled 2021-12-06: qty 60, 30d supply, fill #0

## 2021-12-06 MED ORDER — APIXABAN 5 MG PO TABS
5.0000 mg | ORAL_TABLET | Freq: Two times a day (BID) | ORAL | 2 refills | Status: DC
  Filled 2021-12-06: qty 60, 30d supply, fill #0

## 2021-12-06 MED ORDER — APIXABAN 5 MG PO TABS
5.0000 mg | ORAL_TABLET | Freq: Two times a day (BID) | ORAL | Status: DC
  Administered 2021-12-06: 5 mg via ORAL
  Filled 2021-12-06: qty 1

## 2021-12-06 MED ORDER — AMIODARONE HCL 200 MG PO TABS
200.0000 mg | ORAL_TABLET | Freq: Two times a day (BID) | ORAL | Status: DC
  Administered 2021-12-06: 200 mg via ORAL
  Filled 2021-12-06: qty 1

## 2021-12-06 MED ORDER — DILTIAZEM HCL 60 MG PO TABS
60.0000 mg | ORAL_TABLET | Freq: Every evening | ORAL | 0 refills | Status: DC
  Filled 2021-12-06: qty 1, 1d supply, fill #0

## 2021-12-06 NOTE — Progress Notes (Addendum)
ANTICOAGULATION CONSULT NOTE - Follow Up Consult  Pharmacy Consult for Heparin IV Indication: atrial fibrillation and VTE prophylaxis. Remote history of PE  No Known Allergies  Patient Measurements: Height: 5\' 10"  (177.8 cm) Weight: 106.1 kg (234 lb) IBW/kg (Calculated) : 73 Heparin Dosing Weight: 95.7 kg  Vital Signs: Temp: 97.8 F (36.6 C) (01/27 0504) Temp Source: Oral (01/27 0504) BP: 114/70 (01/27 0553) Pulse Rate: 61 (01/27 0600)  Labs: Recent Labs    12/05/21 0548 12/05/21 1400 12/05/21 1447 12/05/21 2226 12/06/21 0134  HGB 15.2  --   --   --  14.1  HCT 44.5  --   --   --  41.4  PLT 244  --   --   --  211  HEPARINUNFRC  --  0.83*  --  0.28* 0.29*  CREATININE 0.82  --  0.83  --   --   TROPONINIHS 16  --   --   --   --      Estimated Creatinine Clearance: 108.2 mL/min (by C-G formula based on SCr of 0.83 mg/dL).   Medications:  Infusions:   sodium chloride 20 mL/hr at 12/06/21 0427   amiodarone 30 mg/hr (12/05/21 1529)   diltiazem (CARDIZEM) infusion 15 mg/hr (12/05/21 1432)   heparin 1,250 Units/hr (12/06/21 0427)    Assessment: 66 y.o. male who presents to the Emergency Department complaining of A. fib. He had similar episode of A. fib in November 2021 and was admitted to the hospital.  He had been discharged home on Eliquis and self discontinued the medication after taking it for 8 months.  It was recommended that he continue on the A. fib dosing for primary stroke prevention.  Heparin just below goal (0.29) on 1250 units/hr. No bleeding or issues with infusion per RN. CBC within normal limits.   Goal of Therapy:  Heparin level 0.3-0.7 units/ml Monitor platelets by anticoagulation protocol: Yes   Plan:  Increase heparin drip to 1350 units/hr Daily heparin level and CBC ordered Monitor for signs/symptoms of bleed Follow up with long term anticoagulation plan  December 2021 PharmD., BCPS Clinical Pharmacist 12/06/2021 8:08 AM

## 2021-12-06 NOTE — TOC Benefit Eligibility Note (Signed)
Patient Advocate Encounter ° °Insurance verification completed.   ° °The patient is currently admitted and upon discharge could be taking Eliquis 5 mg. ° °The current 30 day co-pay is, $85.02.  ° °The patient is insured through [employer]  ° ° °Ilyas Lipsitz, CPhT °Pharmacy Patient Advocate Specialist °[facility] Patient Advocate Team °Direct Number: [phone]  Fax: [phone] ° ° ° ° ° °  °

## 2021-12-06 NOTE — Discharge Summary (Addendum)
Name: Constant Freyre MRN: [id number] DOB: 1956/06/03 66 y.o. PCP: Heywood Bene, PA-C  Date of Admission: 12/05/2021  5:36 AM Date of Discharge:  12/06/2021 Attending Physician: Dr.  Saverio Danker  DISCHARGE DIAGNOSIS:  Primary Problem: Atrial fibrillation [facility])   Hospital Problems: Principal Problem:   Atrial fibrillation [facility]) Active Problems:   Chronic pulmonary embolism ([place])    DISCHARGE MEDICATIONS:   Allergies as of 12/06/2021   No Known Allergies      Medication List     STOP taking these medications    metoprolol succinate 50 MG 24 hr tablet Commonly known as: TOPROL-XL       TAKE these medications    allopurinol 100 MG tablet Commonly known as: ZYLOPRIM Take 100 mg by mouth in the morning.   amiodarone 200 MG tablet Commonly known as: PACERONE Take 1 tablet (200 mg total) by mouth 2 (two) times daily for 7 days, THEN 1 tablet (200 mg total) daily for 23 days. Start taking on: December 06, 2021   apixaban 5 MG Tabs tablet Commonly known as: ELIQUIS Take 1 tablet (5 mg total) by mouth 2 (two) times daily.   aspirin 81 MG EC tablet Take 1 tablet (81 mg total) by mouth daily. Swallow whole.   atorvastatin 10 MG tablet Commonly known as: LIPITOR Take 10 mg by mouth daily.   DAILY MULTIVITAMIN PO Take 1 tablet by mouth daily.   diltiazem 120 MG 24 hr capsule Commonly known as: CARDIZEM CD Take 2 capsules (240 mg total) by mouth daily. Start taking on: December 07, 2021   diltiazem 60 MG tablet Commonly known as: Cardizem Take 1 tablet (60 mg total) by mouth at bedtime for 1 dose. Please take one 60 mg tablet at 1800 (6:00 pm) tonight.   RA Probiotic Digestive Care Caps Take 1 capsule by mouth in the morning.   spironolactone 25 MG tablet Commonly known as: ALDACTONE TAKE 1 TABLET (25 MG TOTAL) BY MOUTH DAILY.   tadalafil 20 MG tablet Commonly known as: CIALIS Take 20 mg by mouth daily as needed for erectile dysfunction.         DISPOSITION AND FOLLOW-UP:  Mr.Percy Arko was discharged from [facility] in Stable condition. At the hospital follow up visit please address:  Follow-up Recommendations: Consults: Cardiology Labs: Basic Metabolic Profile Studies: Annual imaging for ascending thoracic aortic aneurysm. Medications: New meds: -Discontinue metoprolol -start amiodarone 200 mg twice daily -Start diltiazem 240 mg extended release daily -Restart Eliquis 5 mg twice daily  Follow-up Appointments:  Follow-up Information     Alethia Berthold, PA-C Follow up on 12/26/2021.   Specialty: Cardiology Why: 930am Contact information: [place] [address] [phone]         Heywood Bene, PA-C. Call in 1 week(s).   Specialty: Physician Assistant Contact information: [address] [place] [address] [phone]                 HOSPITAL COURSE:  Patient Summary: Neshawn Sieve is a 66 y.o. with HTN,HLD,paroxysmal atrial fibrillation, hx of atrial flutter, HFrEF, hx of pulmonary embolism (08/2020) not currently taking Eliquis who presented for evaluation of irregular heartbeat and found to be in rapid atrial flutter.  #Atrial Flutter:  Patient presents with atrial flutter with rapid ventricular response.  Patient denied any significant symptoms from his arrhythmia.  Etiology was thought to be secondary to shortness of breath in the setting of home insulation  exposure.  Patient had CTA without evidence of acute PE and echo cardiogram was reassuring. Cardiology was consulted started on heparin gtt. and IV Cardizem.  Patient eventually started on IV amiodarone and transition to p.o. amiodarone and p.o. Cardizem.  Patient was also transitioned to p.o. Eliquis.  There was concern that the patient would need TEE with DC cardioversion, but patient pharmacologically converted during his hospital stay.  Patient was discharged with medication  refills and cardiology follow-up.  #Ascending thoracic aorta -Patient had a CT scan with concern for possible ascending thoracic aortic aneurysm of 4.3 cm patient will need annual follow-up. DISCHARGE INSTRUCTIONS:   Discharge Instructions     Diet - low sodium heart healthy   Complete by: As directed    Discharge instructions   Complete by: As directed    Mr. Vyas,  Here are the major changes to your medications. -Discontinue metoprolol succinate -Start amiodarone 200 mg twice daily (once in the morning and once in the night) -Please take diltiazem 60 mg tonight at 6 PM then start the extended release diltiazem 240 mg once daily the following day. -Please continue to take Eliquis 5 mg in the morning and at night.   Increase activity slowly   Complete by: As directed        SUBJECTIVE:  Patient resting in bed.  States that he is feeling back to his baseline.  We discussed the benefits and the risks of staying on Eliquis for his atrial fibrillation/flutter.  Patient endorses understanding.  Counseled patient regarding significant medication changes.  OBJECTIVE:  Discharge Vitals:   BP 114/70    Pulse 61    Temp 97.8 F (36.6 C) (Oral)    Resp 17    Ht 5\' 10"  (1.778 m)    Wt 106.1 kg    SpO2 95%    BMI 33.58 kg/m  Physical Exam Constitutional:      Appearance: Normal appearance.  HENT:     Head: Normocephalic and atraumatic.  Eyes:     Extraocular Movements: Extraocular movements intact.  Cardiovascular:     Rate and Rhythm: Normal rate.     Pulses: Normal pulses.     Heart sounds: Normal heart sounds.  Pulmonary:     Effort: Pulmonary effort is normal.     Breath sounds: Normal breath sounds.  Abdominal:     General: Bowel sounds are normal.     Palpations: Abdomen is soft.     Tenderness: There is no abdominal tenderness.  Musculoskeletal:        General: Normal range of motion.     Cervical back: Normal range of motion.     Right lower leg: No edema.     Left  lower leg: No edema.  Skin:    General: Skin is warm and dry.  Neurological:     Mental Status: He is alert and oriented to person, place, and time. Mental status is at baseline.  Psychiatric:        Mood and Affect: Mood normal.     Pertinent Labs, Studies, and Procedures:  CBC Latest Ref Rng & Units 12/06/2021 12/05/2021 09/26/2020  WBC 4.0 - 10.5 K/uL 7.8 8.2 11.1(H)  Hemoglobin 13.0 - 17.0 g/dL 14.1 15.2 12.8(L)  Hematocrit 39.0 - 52.0 % 41.4 44.5 40.5  Platelets 150 - 400 K/uL 211 244 349    CMP Latest Ref Rng & Units 12/05/2021 12/05/2021 09/26/2020  Glucose 70 - 99 mg/dL 156(H) 109(H) 89  BUN 8 - 23  mg/dL 9 11 11   Creatinine 0.61 - 1.24 mg/dL 0.83 0.82 0.88  Sodium 135 - 145 mmol/L 138 139 137  Potassium 3.5 - 5.1 mmol/L 3.8 5.2(H) 3.9  Chloride 98 - 111 mmol/L 105 105 99  CO2 22 - 32 mmol/L 25 26 33(H)  Calcium 8.9 - 10.3 mg/dL 9.0 9.0 8.7(L)  Total Protein 6.5 - 8.1 g/dL - 6.0(L) -  Total Bilirubin 0.3 - 1.2 mg/dL - 1.6(H) -  Alkaline Phos 38 - 126 U/L - 59 -  AST 15 - 41 U/L - 33 -  ALT 0 - 44 U/L - 15 -    CT Angio Chest PE W/Cm &/Or Wo Cm  Result Date: 12/05/2021 CLINICAL DATA:  Pulmonary embolism (PE) suspected, high prob. Onset of upper chest heaviness yesterday. Hx of PEs. Pt was taken off of Eliquis in October, 2022 EXAM: CT ANGIOGRAPHY CHEST WITH CONTRAST TECHNIQUE: Multidetector CT imaging of the chest was performed using the standard protocol during bolus administration of intravenous contrast. Multiplanar CT image reconstructions and MIPs were obtained to evaluate the vascular anatomy. RADIATION DOSE REDUCTION: This exam was performed according to the departmental dose-optimization program which includes automated exposure control, adjustment of the mA and/or kV according to patient size and/or use of iterative reconstruction technique. CONTRAST:  2mL OMNIPAQUE IOHEXOL 350 MG/ML SOLN COMPARISON:  Chest XR, concurrent. CT chest, 09/23/2020. CTA PE, 08/27/2020.  FINDINGS: Suboptimal evaluation, secondary to motion artifact and poor contrast opacification such that subsegmental emboli could missed. Cardiovascular: Satisfactory opacification of the pulmonary arteries to the segmental level. Partially-occlusive web-like filling defects within the RIGHT upper lobe apical segmental branch. See key image. This was present on 08/2020 comparison. No additional evidence to suggest acute segmental or larger pulmonary embolus. Dilated main PA, measuring up to 3.7 cm. 4.3 cm ascending thoracic aorta. Normal heart size. No pericardial effusion. Coronary atherosclerosis, greatest burden within the LAD. Mediastinum/Nodes: No enlarged mediastinal, hilar, or axillary lymph nodes. Thyroid gland, trachea, and esophagus demonstrate no significant findings. Lungs/Pleura: Hypoinflation with trace bilateral atelectasis. No suspicious pulmonary nodule or mass. No pleural effusion or pneumothorax. Upper Abdomen: No acute abnormality. Musculoskeletal: Gynecomastia.  No acute osseous findings. Review of the MIP images confirms the above findings. IMPRESSION: 1. Chronic-appearing RIGHT upper lobe apical segmental pulmonary embolus. No additional evidence to suggest acute segmental or larger pulmonary emboli. 2. Main pulmonary artery dilation, likely reflecting underlying pulmonary arterial hypertension. 3. 4.3 cm ascending thoracic aorta. Recommend annual imaging followup by CTA or MRA. This recommendation follows 2010 ACCF/AHA/AATS/ACR/ASA/SCA/SCAI/SIR/STS/SVM Guidelines for the Diagnosis and Management of Patients with Thoracic Aortic Disease. Circulation. 2010; [id number]. Aortic aneurysm NOS (ICD10-I71.9) 4. Coronary atherosclerosis, with greatest burden within the LAD. 5.  Additional incidental, chronic and senescent findings as above. Electronically Signed   By: Michaelle Birks M.D.   On: 12/05/2021 08:47   DG Chest Port 1 View  Result Date: 12/05/2021 CLINICAL DATA:  Chest pain.  Atrial  fibrillation EXAM: PORTABLE CHEST 1 VIEW COMPARISON:  Chest CT 09/23/2020 FINDINGS: Low volume chest with interstitial crowding, unchanged. Generous heart size accentuated by mediastinal fat on prior study. There was also a pericardial effusion or thickening on prior. Artifact from EKG leads. There is no edema, consolidation, effusion, or pneumothorax. IMPRESSION: No acute finding when compared to 2021. Electronically Signed   By: Jorje Guild M.D.   On: 12/05/2021 06:41   ECHOCARDIOGRAM COMPLETE  Result Date: 12/05/2021    ECHOCARDIOGRAM REPORT   Patient Name:   RALPHAEL TRAME Date  of Exam: 12/05/2021 Medical Rec #:  [id number]     Height:       70.0 in Accession #:    [id number]    Weight:       234.0 lb Date of Birth:  [birth year]     BSA:          2.231 m Patient Age:    34 years      BP:           109/76 mmHg Patient Gender: M             HR:           151 bpm. Exam Location:  Inpatient Procedure: 2D Echo Indications:    Atrial flutter  History:        Patient has prior history of Echocardiogram examinations, most                 recent 09/24/2020. Arrythmias:Atrial Fibrillation; Risk                 Factors:Hypertension.  Sonographer:    Jefferey Pica Referring Phys: [id number] [place]  1. Left ventricular ejection fraction, by estimation, is 60 to 65%. The left ventricle has normal function. The left ventricle has no regional wall motion abnormalities. There is moderate concentric left ventricular hypertrophy. Left ventricular diastolic parameters are indeterminate.  2. Right ventricular systolic function is normal. The right ventricular size is normal. Tricuspid regurgitation signal is inadequate for assessing PA pressure.  3. The mitral valve is normal in structure. No evidence of mitral valve regurgitation. No evidence of mitral stenosis.  4. The aortic valve is normal in structure. Aortic valve regurgitation is not visualized. No aortic stenosis is present.  5. Aortic dilatation noted.  There is mild dilatation of the aortic root, measuring 38 mm. There is mild dilatation of the ascending aorta, measuring 40 mm.  6. The inferior vena cava is normal in size with greater than 50% respiratory variability, suggesting right atrial pressure of 3 mmHg. FINDINGS  Left Ventricle: Left ventricular ejection fraction, by estimation, is 60 to 65%. The left ventricle has normal function. The left ventricle has no regional wall motion abnormalities. The left ventricular internal cavity size was normal in size. There is  moderate concentric left ventricular hypertrophy. Left ventricular diastolic function could not be evaluated due to atrial fibrillation. Left ventricular diastolic parameters are indeterminate. Right Ventricle: The right ventricular size is normal. No increase in right ventricular wall thickness. Right ventricular systolic function is normal. Tricuspid regurgitation signal is inadequate for assessing PA pressure. Left Atrium: Left atrial size was normal in size. Right Atrium: Right atrial size was normal in size. Pericardium: There is no evidence of pericardial effusion. Mitral Valve: The mitral valve is normal in structure. No evidence of mitral valve regurgitation. No evidence of mitral valve stenosis. Tricuspid Valve: The tricuspid valve is normal in structure. Tricuspid valve regurgitation is not demonstrated. No evidence of tricuspid stenosis. Aortic Valve: The aortic valve is normal in structure. Aortic valve regurgitation is not visualized. No aortic stenosis is present. Aortic valve peak gradient measures 3.4 mmHg. Pulmonic Valve: The pulmonic valve was normal in structure. Pulmonic valve regurgitation is not visualized. No evidence of pulmonic stenosis. Aorta: Aortic dilatation noted. There is mild dilatation of the aortic root, measuring 38 mm. There is mild dilatation of the ascending aorta, measuring 40 mm. Venous: The inferior vena cava is normal in size with greater than 50%  respiratory variability, suggesting  right atrial pressure of 3 mmHg. IAS/Shunts: No atrial level shunt detected by color flow Doppler.  LEFT VENTRICLE PLAX 2D LVIDd:         4.10 cm LVIDs:         2.95 cm LV PW:         1.25 cm LV IVS:        1.60 cm LVOT diam:     2.10 cm LV SV:         43 LV SV Index:   19 LVOT Area:     3.46 cm  RIGHT VENTRICLE          IVC RV Basal diam:  2.90 cm  IVC diam: 1.20 cm LEFT ATRIUM             Index        RIGHT ATRIUM           Index LA diam:        3.90 cm 1.75 cm/m   RA Area:     14.65 cm LA Vol (A2C):   48.4 ml 21.69 ml/m  RA Volume:   33.85 ml  15.17 ml/m LA Vol (A4C):   78.8 ml 35.31 ml/m LA Biplane Vol: 64.8 ml 29.04 ml/m  AORTIC VALVE                 PULMONIC VALVE AV Area (Vmax): 3.37 cm     PV Vmax:       0.61 m/s AV Vmax:        92.68 cm/s   PV Peak grad:  1.5 mmHg AV Peak Grad:   3.4 mmHg LVOT Vmax:      90.07 cm/s LVOT Vmean:     63.800 cm/s LVOT VTI:       0.123 m  AORTA Ao Root diam: 3.80 cm Ao Asc diam:  4.00 cm MITRAL VALVE MV Area (PHT): 9.81 cm    SHUNTS MV Decel Time: 77 msec     Systemic VTI:  0.12 m MV E velocity: 93.13 cm/s  Systemic Diam: 2.10 cm Fransico Him MD Electronically signed by Fransico Him MD Signature Date/Time: 12/05/2021/2:39:24 PM    Final      Signed: Lawerance Cruel, D.O.  Internal Medicine Resident, PGY-3 [facility]  Pager: [phone] 3:01 PM, 12/06/2021

## 2021-12-06 NOTE — Progress Notes (Signed)
Ivs removed, discharge instructions reviewed with pt and pt verbalized understanding.  Meds delivered from [facility] pharmacy.

## 2021-12-06 NOTE — Discharge Summary (Incomplete)
Name: Carl [name] MRN: [id number] DOB: 1955/11/16 66 y.o. PCP: Bernita Buffy  Date of Admission: 12/05/2021  5:36 AM Date of Discharge: 12/06/21 Attending Physician: Dickie La, MD  Discharge Diagnosis: 1. Atrial flutter Ascending thoracic aorta  Chronic HTN CAD HLD  Discharge Medications: Allergies as of 12/06/2021   No Known Allergies      Medication List     STOP taking these medications    metoprolol succinate 50 MG 24 hr tablet Commonly known as: TOPROL-XL       TAKE these medications    allopurinol 100 MG tablet Commonly known as: ZYLOPRIM Take 100 mg by mouth in the morning.   amiodarone 200 MG tablet Commonly known as: PACERONE Take 1 tablet (200 mg total) by mouth 2 (two) times daily for 14 days, THEN 1 tablet (200 mg total) daily for 16 days. Start taking on: December 06, 2021   apixaban 5 MG Tabs tablet Commonly known as: ELIQUIS Take 1 tablet (5 mg total) by mouth 2 (two) times daily.   aspirin 81 MG EC tablet Take 1 tablet (81 mg total) by mouth daily. Swallow whole.   atorvastatin 10 MG tablet Commonly known as: LIPITOR Take 10 mg by mouth daily.   DAILY MULTIVITAMIN PO Take 1 tablet by mouth daily.   diltiazem 120 MG 24 hr capsule Commonly known as: CARDIZEM CD Take 2 capsules (240 mg total) by mouth daily. Start taking on: December 07, 2021   diltiazem 60 MG tablet Commonly known as: Cardizem Take 1 tablet (60 mg total) by mouth at bedtime for 1 dose. Please take one 60 mg tablet at 1800 (6:00 pm) tonight.   RA Probiotic Digestive Care Caps Take 1 capsule by mouth in the morning.   spironolactone 25 MG tablet Commonly known as: ALDACTONE TAKE 1 TABLET (25 MG TOTAL) BY MOUTH DAILY.   tadalafil 20 MG tablet Commonly known as: CIALIS Take 20 mg by mouth daily as needed for erectile dysfunction.        Disposition and follow-up:   [name] was discharged from [facility] in  {DISCHARGE CONDITION:19696} condition.  At the hospital follow up visit please address:  Atrial flutter  Ascending thoracic aorta Incidental finding on CTA, 4.3 cm ascending thoracic aorta.  Patient will need follow-up annually.  Labs / imaging needed at time of follow-up: ***  Pending labs/ test needing follow-up: ***  Follow-up Appointments:  Follow-up Information     Rayford Halsted, PA-C Follow up on 12/26/2021.   Specialty: Cardiology Why: 930am Contact information: [address] [place] [address] [phone]                 Hospital Course by problem list: Atrial flutter HTN CAD HLD Ascending thoracic aorta Incidental finding on CTA, 4.3 cm ascending thoracic aorta.  Patient will need follow-up annually, place in discharge.   Subjective: Patient assessed at bedside this AM.  He states that today her feels better.  He would like to go home. No other concerns at this time.  Discharge Exam:   BP 114/70    Pulse 61    Temp 97.8 F (36.6 C) (Oral)    Resp 17    Ht 5\' 10"  (1.778 m)    Wt 106.1 kg    SpO2 95%    BMI 33.58 kg/m  Discharge exam: ***  Pertinent Labs, Studies, and Procedures:  BMP Latest Ref Rng & Units 12/05/2021 12/05/2021 09/26/2020  Glucose 70 - 99 mg/dL 156(H) 109(H) 89  BUN 8 - 23 mg/dL 9 11 11   Creatinine 0.61 - 1.24 mg/dL 0.83 0.82 0.88  Sodium 135 - 145 mmol/L 138 139 137  Potassium 3.5 - 5.1 mmol/L 3.8 5.2(H) 3.9  Chloride 98 - 111 mmol/L 105 105 99  CO2 22 - 32 mmol/L 25 26 33(H)  Calcium 8.9 - 10.3 mg/dL 9.0 9.0 8.7(L)    CBC Latest Ref Rng & Units 12/06/2021 12/05/2021 09/26/2020  WBC 4.0 - 10.5 K/uL 7.8 8.2 11.1(H)  Hemoglobin 13.0 - 17.0 g/dL 14.1 15.2 12.8(L)  Hematocrit 39.0 - 52.0 % 41.4 44.5 40.5  Platelets 150 - 400 K/uL 211 244 349     Discharge Instructions: Discharge Instructions     Diet - low sodium heart healthy   Complete by: As directed    Discharge instructions   Complete by: As directed     [name],  Here are the major changes to your medications. -Discontinue metoprolol succinate -Start amiodarone 200 mg twice daily (once in the morning and once in the night) -Please take diltiazem 60 mg tonight at 6 PM then start the extended release diltiazem 240 mg once daily the following day. -Please continue to take Eliquis 5 mg in the morning and at night.   Increase activity slowly   Complete by: As directed        Signed: Marianna Payment, MD 12/06/2021, 2:25 PM   Pager: @[phone] @

## 2021-12-06 NOTE — Progress Notes (Signed)
°  Transition of Care [facility]) Screening Note   Patient Details  Name: Manny Wollert Date of Birth: Jun 27, 1956   Transition of Care [facility]) CM/SW Contact:    Milas Gain, [place] Phone Number: 12/06/2021, 10:57 AM    Transition of Care [facility]) has reviewed patient and no TOC needs have been identified at this time. We will continue to monitor patient advancement through interdisciplinary progression rounds. If new patient transition needs arise, please place a TOC consult.

## 2021-12-06 NOTE — Progress Notes (Signed)
Progress Note  Patient Name: Carl [name] Date of Encounter: 12/06/2021  Attending physician: Charise Killian, MD Primary care provider: Merwyn Katos Primary Cardiologist: Dr. Einar Gip   Subjective: Carl [name] is a 66 y.o. [name] who was seen and examined at bedside  Resting in bed Feels back to baseline  TEE/Cardioversion cancelled as he remains in SR.  Case discussed and reviewed with his nurse.  Objective: Vital Signs in the last 24 hours: Temp:  [97.8 F (36.6 C)-98.4 F (36.9 C)] 97.8 F (36.6 C) (01/27 0504) Pulse Rate:  [50-161] 61 (01/27 0600) Resp:  [13-25] 17 (01/27 0600) BP: (101-149)/(62-121) 114/70 (01/27 0553) SpO2:  [91 %-99 %] 95 % (01/27 0600)  Intake/Output:  Intake/Output Summary (Last 24 hours) at 12/06/2021 0924 Last data filed at 12/06/2021 0427 Gross per 24 hour  Intake 770.07 ml  Output 200 ml  Net 570.07 ml    Net IO Since Admission: 220.07 mL [12/06/21 0924]  Weights:  Filed Weights   12/05/21 0557  Weight: 106.1 kg    Telemetry: Personally reviewed. SR now.   Physical examination: PHYSICAL EXAM: Vitals with BMI 12/06/2021 12/06/2021 12/06/2021  Height - - -  Weight - - -  BMI - - -  Systolic - [id number]  Diastolic - 70 76  Pulse 61 - 66    CONSTITUTIONAL: Age-appropriate, hemodynamically stable, No acute distress.  SKIN: Skin is warm and dry. No rash noted. No cyanosis. No pallor. No jaundice HEAD: Normocephalic and atraumatic.  EYES: No scleral icterus MOUTH/THROAT: Moist oral membranes.  NECK: No JVD present. No thyromegaly noted. No carotid bruits  LYMPHATIC: No visible cervical adenopathy.  CHEST Normal respiratory effort. No intercostal retractions  LUNGS: Clear to auscultation bilaterally.  No stridor. No wheezes. No rales.  CARDIOVASCULAR: Irregularly irregular, variable S1-S2, no murmurs rubs or gallops appreciated. ABDOMINAL: Obese, soft, nontender, nondistended, positive bowel sounds in all 4 quadrants, no  apparent ascites.  EXTREMITIES: No peripheral edema, warm to touch, 2+ DP and PT pulses. HEMATOLOGIC: No significant bruising NEUROLOGIC: Oriented to person, place, and time. Nonfocal. Normal muscle tone.  PSYCHIATRIC: Normal mood and affect. Normal behavior. Cooperative  Lab Results: Hematology Recent Labs  Lab 12/05/21 0548 12/06/21 0134  WBC 8.2 7.8  RBC 4.58 4.27  HGB 15.2 14.1  HCT 44.5 41.4  MCV 97.2 97.0  MCH 33.2 33.0  MCHC 34.2 34.1  RDW 12.5 12.5  PLT 244 211    Chemistry Recent Labs  Lab 12/05/21 0548 12/05/21 1447  NA 139 138  K 5.2* 3.8  CL 105 105  CO2 26 25  GLUCOSE 109* 156*  BUN 11 9  CREATININE 0.82 0.83  CALCIUM 9.0 9.0  PROT 6.0*  --   ALBUMIN 3.2*  --   AST 33  --   ALT 15  --   ALKPHOS 59  --   BILITOT 1.6*  --   GFRNONAA >60 >60  ANIONGAP 8 8     Cardiac Enzymes: Cardiac Panel (last 3 results) Recent Labs    12/05/21 0548  TROPONINIHS 16    BNP (last 3 results) No results for input(s): BNP in the last 8760 hours.  ProBNP (last 3 results) No results for input(s): PROBNP in the last 8760 hours.   DDimer No results for input(s): DDIMER in the last 168 hours.   Hemoglobin A1c:  Lab Results  Component Value Date   HGBA1C 5.9 (H) 12/05/2021   MPG 123 12/05/2021    TSH  Recent Labs  12/05/21 2226  TSH 4.008    Lipid Panel No results found for: CHOL, TRIG, HDL, CHOLHDL, VLDL, LDLCALC, LDLDIRECT  Imaging: CT Angio Chest PE W/Cm &/Or Wo Cm  Result Date: 12/05/2021 CLINICAL DATA:  Pulmonary embolism (PE) suspected, high prob. Onset of upper chest heaviness yesterday. Hx of PEs. Pt was taken off of Eliquis in October, 2022 EXAM: CT ANGIOGRAPHY CHEST WITH CONTRAST TECHNIQUE: Multidetector CT imaging of the chest was performed using the standard protocol during bolus administration of intravenous contrast. Multiplanar CT image reconstructions and MIPs were obtained to evaluate the vascular anatomy. RADIATION DOSE REDUCTION:  This exam was performed according to the departmental dose-optimization program which includes automated exposure control, adjustment of the mA and/or kV according to patient size and/or use of iterative reconstruction technique. CONTRAST:  39mL OMNIPAQUE IOHEXOL 350 MG/ML SOLN COMPARISON:  Chest XR, concurrent. CT chest, 09/23/2020. CTA PE, 08/27/2020. FINDINGS: Suboptimal evaluation, secondary to motion artifact and poor contrast opacification such that subsegmental emboli could missed. Cardiovascular: Satisfactory opacification of the pulmonary arteries to the segmental level. Partially-occlusive web-like filling defects within the RIGHT upper lobe apical segmental branch. See key image. This was present on 08/2020 comparison. No additional evidence to suggest acute segmental or larger pulmonary embolus. Dilated main PA, measuring up to 3.7 cm. 4.3 cm ascending thoracic aorta. Normal heart size. No pericardial effusion. Coronary atherosclerosis, greatest burden within the LAD. Mediastinum/Nodes: No enlarged mediastinal, hilar, or axillary lymph nodes. Thyroid gland, trachea, and esophagus demonstrate no significant findings. Lungs/Pleura: Hypoinflation with trace bilateral atelectasis. No suspicious pulmonary nodule or mass. No pleural effusion or pneumothorax. Upper Abdomen: No acute abnormality. Musculoskeletal: Gynecomastia.  No acute osseous findings. Review of the MIP images confirms the above findings. IMPRESSION: 1. Chronic-appearing RIGHT upper lobe apical segmental pulmonary embolus. No additional evidence to suggest acute segmental or larger pulmonary emboli. 2. Main pulmonary artery dilation, likely reflecting underlying pulmonary arterial hypertension. 3. 4.3 cm ascending thoracic aorta. Recommend annual imaging followup by CTA or MRA. This recommendation follows 2010 ACCF/AHA/AATS/ACR/ASA/SCA/SCAI/SIR/STS/SVM Guidelines for the Diagnosis and Management of Patients with Thoracic Aortic Disease.  Circulation. 2010; [id number]. Aortic aneurysm NOS (ICD10-I71.9) 4. Coronary atherosclerosis, with greatest burden within the LAD. 5.  Additional incidental, chronic and senescent findings as above. Electronically Signed   By: Michaelle Birks M.D.   On: 12/05/2021 08:47   DG Chest Port 1 View  Result Date: 12/05/2021 CLINICAL DATA:  Chest pain.  Atrial fibrillation EXAM: PORTABLE CHEST 1 VIEW COMPARISON:  Chest CT 09/23/2020 FINDINGS: Low volume chest with interstitial crowding, unchanged. Generous heart size accentuated by mediastinal fat on prior study. There was also a pericardial effusion or thickening on prior. Artifact from EKG leads. There is no edema, consolidation, effusion, or pneumothorax. IMPRESSION: No acute finding when compared to 2021. Electronically Signed   By: Jorje Guild M.D.   On: 12/05/2021 06:41   ECHOCARDIOGRAM COMPLETE  Result Date: 12/05/2021    ECHOCARDIOGRAM REPORT   Patient Name:   Carl [name] Date of Exam: 12/05/2021 Medical Rec #:  [id number]     Height:       70.0 in Accession #:    [id number]    Weight:       234.0 lb Date of Birth:  10-26-1956     BSA:          2.231 m Patient Age:    19 years      BP:           109/76 mmHg Patient  Gender: M             HR:           151 bpm. Exam Location:  Inpatient Procedure: 2D Echo Indications:    Atrial flutter  History:        Patient has prior history of Echocardiogram examinations, most                 recent 09/24/2020. Arrythmias:Atrial Fibrillation; Risk                 Factors:Hypertension.  Sonographer:    Jefferey Pica Referring Phys: [id number] [place]  1. Left ventricular ejection fraction, by estimation, is 60 to 65%. The left ventricle has normal function. The left ventricle has no regional wall motion abnormalities. There is moderate concentric left ventricular hypertrophy. Left ventricular diastolic parameters are indeterminate.  2. Right ventricular systolic function is normal. The right ventricular  size is normal. Tricuspid regurgitation signal is inadequate for assessing PA pressure.  3. The mitral valve is normal in structure. No evidence of mitral valve regurgitation. No evidence of mitral stenosis.  4. The aortic valve is normal in structure. Aortic valve regurgitation is not visualized. No aortic stenosis is present.  5. Aortic dilatation noted. There is mild dilatation of the aortic root, measuring 38 mm. There is mild dilatation of the ascending aorta, measuring 40 mm.  6. The inferior vena cava is normal in size with greater than 50% respiratory variability, suggesting right atrial pressure of 3 mmHg. FINDINGS  Left Ventricle: Left ventricular ejection fraction, by estimation, is 60 to 65%. The left ventricle has normal function. The left ventricle has no regional wall motion abnormalities. The left ventricular internal cavity size was normal in size. There is  moderate concentric left ventricular hypertrophy. Left ventricular diastolic function could not be evaluated due to atrial fibrillation. Left ventricular diastolic parameters are indeterminate. Right Ventricle: The right ventricular size is normal. No increase in right ventricular wall thickness. Right ventricular systolic function is normal. Tricuspid regurgitation signal is inadequate for assessing PA pressure. Left Atrium: Left atrial size was normal in size. Right Atrium: Right atrial size was normal in size. Pericardium: There is no evidence of pericardial effusion. Mitral Valve: The mitral valve is normal in structure. No evidence of mitral valve regurgitation. No evidence of mitral valve stenosis. Tricuspid Valve: The tricuspid valve is normal in structure. Tricuspid valve regurgitation is not demonstrated. No evidence of tricuspid stenosis. Aortic Valve: The aortic valve is normal in structure. Aortic valve regurgitation is not visualized. No aortic stenosis is present. Aortic valve peak gradient measures 3.4 mmHg. Pulmonic Valve: The  pulmonic valve was normal in structure. Pulmonic valve regurgitation is not visualized. No evidence of pulmonic stenosis. Aorta: Aortic dilatation noted. There is mild dilatation of the aortic root, measuring 38 mm. There is mild dilatation of the ascending aorta, measuring 40 mm. Venous: The inferior vena cava is normal in size with greater than 50% respiratory variability, suggesting right atrial pressure of 3 mmHg. IAS/Shunts: No atrial level shunt detected by color flow Doppler.  LEFT VENTRICLE PLAX 2D LVIDd:         4.10 cm LVIDs:         2.95 cm LV PW:         1.25 cm LV IVS:        1.60 cm LVOT diam:     2.10 cm LV SV:         43 LV  SV Index:   19 LVOT Area:     3.46 cm  RIGHT VENTRICLE          IVC RV Basal diam:  2.90 cm  IVC diam: 1.20 cm LEFT ATRIUM             Index        RIGHT ATRIUM           Index LA diam:        3.90 cm 1.75 cm/m   RA Area:     14.65 cm LA Vol (A2C):   48.4 ml 21.69 ml/m  RA Volume:   33.85 ml  15.17 ml/m LA Vol (A4C):   78.8 ml 35.31 ml/m LA Biplane Vol: 64.8 ml 29.04 ml/m  AORTIC VALVE                 PULMONIC VALVE AV Area (Vmax): 3.37 cm     PV Vmax:       0.61 m/s AV Vmax:        92.68 cm/s   PV Peak grad:  1.5 mmHg AV Peak Grad:   3.4 mmHg LVOT Vmax:      90.07 cm/s LVOT Vmean:     63.800 cm/s LVOT VTI:       0.123 m  AORTA Ao Root diam: 3.80 cm Ao Asc diam:  4.00 cm MITRAL VALVE MV Area (PHT): 9.81 cm    SHUNTS MV Decel Time: 77 msec     Systemic VTI:  0.12 m MV E velocity: 93.13 cm/s  Systemic Diam: 2.10 cm Fransico Him MD Electronically signed by Fransico Him MD Signature Date/Time: 12/05/2021/2:39:24 PM    Final     CARDIAC DATABASE: EKG: 12/05/2020: Atrial flutter with 2:1 conduction at a rate of 153 bpm. 12/06/2021: Normal sinus rhythm, 62 bpm, left axis, TWI in inferior lateral leads suggestive of possible ischemia, without underlying injury pattern.  Echocardiogram: 12/05/2021: LVEF 60 to 65%, moderate LVH, no significant valvular heart disease, aortic  root 38 mm ascending aorta 40 mm.  Stress test: None  Heart catheterization: None  Scheduled Meds:  allopurinol  100 mg Oral q AM   apixaban  5 mg Oral BID   aspirin EC  81 mg Oral Daily   atorvastatin  10 mg Oral Daily   diltiazem  60 mg Oral Q6H   spironolactone  25 mg Oral Daily    Continuous Infusions:  sodium chloride 20 mL/hr at 12/06/21 0427   amiodarone 30 mg/hr (12/05/21 1529)   diltiazem (CARDIZEM) infusion 15 mg/hr (12/05/21 1432)   heparin 1,350 Units/hr (12/06/21 0834)    PRN Meds: acetaminophen **OR** acetaminophen   IMPRESSION & RECOMMENDATIONS: Sakari Wofford is a 66 y.o. Caucasian [name] whose past medical history and cardiac risk factors include: History of recovered cardiomyopathy, paroxysmal atrial flutter, chronic pulmonary embolism, hypertension, hyperlipidemia, tobacco use disorder.  Paroxysmal atrial flutter: Currently normal sinus rhythm. Discontinue IV amiodarone drip. Transition to amiodarone 200 mg twice daily for 1 week followed by 200 mg p.o. daily until seen in the office. Currently on diltiazem 60 mg p.o. every 6 hours.  Transition to diltiazem 240 mg p.o. daily starting tomorrow morning For now discontinue home dose of metoprolol. Discontinue IV heparin drip and transition to Eliquis 5 mg p.o. twice daily CHA2DS2-VASc SCORE is 3 which correlates to 3.2 % risk of stroke per year (HTN, vascular, age). Patient has recurrent episodes of atrial flutter requiring hospitalization.  Recommend outpatient EP evaluation for atrial flutter ablation.  We will schedule a 2-week follow-up visit in the clinic to see how he is doing clinically, reconcile medications, arrange referrals, and reevaluate symptoms. Since patient has had more than 2 episodes of paroxysmal atrial flutter would recommend continuation of anticoagulation as long as he remains a low bleeding risk or until he has undergone atrial flutter ablation. Reemphasized the risks, benefits, and  alternatives to oral anticoagulation with the patient.  He verbalizes understanding. Plan of care discussed with attending team. Patient is stable to be discharged from a cardiovascular standpoint.  Hypertension: Current blood pressures well controlled.  Medications reconciled.  Hyperlipidemia: Continue statin therapy  Chronic pulmonary embolism: Defer management to hematology and primary team  Patient's questions and concerns were addressed to his satisfaction. He voices understanding of the instructions provided during this encounter.   This note was created using a voice recognition software as a result there may be grammatical errors inadvertently enclosed that do not reflect the nature of this encounter. Every attempt is made to correct such errors.  Follow up appt made and discharge paperwork updated.  Primary team updated w/ recommendations as well.   Total time spent: 14minutes.   Mechele [facility]  Pager: [phone] Office: [phone] 12/06/2021, 9:24 AM

## 2021-12-06 NOTE — Discharge Instructions (Signed)

## 2021-12-09 ENCOUNTER — Telehealth: Payer: Self-pay

## 2021-12-09 NOTE — Telephone Encounter (Signed)
Attempted to contact patient for TOC. NA, no VM-box, cannot leave message.

## 2021-12-09 NOTE — Telephone Encounter (Signed)
Patient has hospital f/u with you on 12/26/21, he will be out of town. Could he do virtual or should he be seen before he leaves town on 12/18/21?

## 2021-12-10 NOTE — Telephone Encounter (Signed)
He needs to be seen so we can do EKG. I can see him before he leaves

## 2021-12-11 ENCOUNTER — Ambulatory Visit: Payer: Federal, State, Local not specified - PPO | Admitting: Sports Medicine

## 2021-12-11 VITALS — BP 130/86 | Ht 70.0 in | Wt 232.0 lb

## 2021-12-11 DIAGNOSIS — M65311 Trigger thumb, right thumb: Secondary | ICD-10-CM

## 2021-12-11 MED ORDER — METHYLPREDNISOLONE ACETATE 40 MG/ML IJ SUSP
20.0000 mg | Freq: Once | INTRAMUSCULAR | Status: AC
  Administered 2021-12-11: 20 mg via INTRA_ARTICULAR

## 2021-12-11 NOTE — Progress Notes (Signed)
Primary Physician/Referring:  Heywood Bene, PA-C  Patient ID: Carl [name], male    DOB: [birth year], 66 y.o.   MRN: [id number]  Chief Complaint  Patient presents with   Atrial Fibrillation   Follow-up   Heart failure   HPI:    Carl [name]  is a 66 y.o. male with past medical history of hypertension, hyperlipidemia, tobacco use disorder, who presented to the emergency department 08/27/2020 in typical atrial flutter with RVR.  He underwent successful direct-current cardioversion.  Patient was then admitted to the hospital again 09/19/2020-09/26/2020 with pneumonia, pulmonary embolism, atrial flutter, and systolic heart failure.   Patient was last seen in the office 05/16/2021 advised to follow-up in 6 months.  However He was admitted 12/05/2021 - 12/06/2021 with atrial flutter with RVR.  Patient continued Eliquis, therefore resumed anticoagulation.  Patient was started on IV diltiazem and amiodarone, he subsequently converted to normal sinus rhythm and was discharged with p.o. amiodarone and diltiazem.  Patient now presents for 1 week follow-up.  He has 1 more day of amiodarone at 200 mg twice daily, he is aware to switch to 200 mg daily following tomorrow.  Patient's primary concern today is fatigue following initiation of amiodarone and diltiazem, otherwise he is feeling well.  He is tolerating anticoagulation without bleeding diathesis.  Patient monitors his heart rate with smart watch, and has had no episodes of tachycardia.  Denies palpitations, syncope, near syncope, chest pain, shortness of breath.   Past Medical History:  Diagnosis Date   Hypercholesteremia    Primary hypertension    Pulmonary embolism ([place]) 08/27/2020   Typical atrial flutter ([place]) 08/27/2020 with PE   Past Surgical History:  Procedure Laterality Date   IR THORACENTESIS ASP PLEURAL SPACE W/IMG GUIDE  09/20/2020   LAPAROTOMY     LEFT HEART CATH AND CORONARY ANGIOGRAPHY N/A 09/25/2020   Procedure: LEFT  HEART CATH AND CORONARY ANGIOGRAPHY;  Surgeon: Nigel Mormon, MD;  Location: [facility];  Service: Cardiovascular;  Laterality: N/A;   Family History  Problem Relation Age of Onset   CAD Mother 56   Heart attack Father    Cancer Sister        lung   Heart disease Brother    Heart failure Brother    Heart attack Brother     Social History   Tobacco Use   Smoking status: Some Days    Types: Cigars   Smokeless tobacco: Never   Tobacco comments:    Once a week  Substance Use Topics   Alcohol use: Yes    Alcohol/week: 1.0 standard drink    Types: 1 Glasses of wine per week    Comment: Occasional   Marital Status: Widowed   ROS  Review of Systems  Constitutional: Positive for malaise/fatigue.  Cardiovascular:  Negative for chest pain, claudication, leg swelling, near-syncope, orthopnea, palpitations, paroxysmal nocturnal dyspnea and syncope.  Respiratory:  Negative for shortness of breath.   Gastrointestinal:  Negative for melena.  Neurological:  Negative for dizziness.   Objective  Blood pressure 128/74, pulse 82, temperature 98 F (36.7 C), temperature source Temporal, height 5' 10"  (1.778 m), weight 241 lb (109.3 kg).  Vitals with BMI 12/12/2021 12/11/2021 12/06/2021  Height 5' 10"  5' 10"  -  Weight 241 lbs 232 lbs -  BMI 67.59 16.38 -  Systolic 466 599 -  Diastolic 74 86 -  Pulse 82 - 61     Physical Exam Vitals reviewed.  Cardiovascular:  Rate and Rhythm: Normal rate and regular rhythm.     Pulses: Intact distal pulses.     Heart sounds: S1 normal and S2 normal. No murmur heard.   No gallop.  Pulmonary:     Effort: Pulmonary effort is normal. No respiratory distress.     Breath sounds: Normal breath sounds. No wheezing, rhonchi or rales.  Musculoskeletal:     Right lower leg: Edema (trace) present.     Left lower leg: Edema (trace) present.  Skin:    General: Skin is warm and dry.  Neurological:     Mental Status: He is alert.   Laboratory  examination:   Recent Labs    12/05/21 0548 12/05/21 1447  NA 139 138  K 5.2* 3.8  CL 105 105  CO2 26 25  GLUCOSE 109* 156*  BUN 11 9  CREATININE 0.82 0.83  CALCIUM 9.0 9.0  GFRNONAA >60 >60   estimated creatinine clearance is 109.8 mL/min (by C-G formula based on SCr of 0.83 mg/dL).  CMP Latest Ref Rng & Units 12/05/2021 12/05/2021 09/26/2020  Glucose 70 - 99 mg/dL 156(H) 109(H) 89  BUN 8 - 23 mg/dL 9 11 11   Creatinine 0.61 - 1.24 mg/dL 0.83 0.82 0.88  Sodium 135 - 145 mmol/L 138 139 137  Potassium 3.5 - 5.1 mmol/L 3.8 5.2(H) 3.9  Chloride 98 - 111 mmol/L 105 105 99  CO2 22 - 32 mmol/L 25 26 33(H)  Calcium 8.9 - 10.3 mg/dL 9.0 9.0 8.7(L)  Total Protein 6.5 - 8.1 g/dL - 6.0(L) -  Total Bilirubin 0.3 - 1.2 mg/dL - 1.6(H) -  Alkaline Phos 38 - 126 U/L - 59 -  AST 15 - 41 U/L - 33 -  ALT 0 - 44 U/L - 15 -   CBC Latest Ref Rng & Units 12/06/2021 12/05/2021 09/26/2020  WBC 4.0 - 10.5 K/uL 7.8 8.2 11.1(H)  Hemoglobin 13.0 - 17.0 g/dL 14.1 15.2 12.8(L)  Hematocrit 39.0 - 52.0 % 41.4 44.5 40.5  Platelets 150 - 400 K/uL 211 244 349    Lipid Panel No results for input(s): CHOL, TRIG, LDLCALC, VLDL, HDL, CHOLHDL, LDLDIRECT in the last 8760 hours.  HEMOGLOBIN A1C Lab Results  Component Value Date   HGBA1C 5.9 (H) 12/05/2021   MPG 123 12/05/2021   TSH Recent Labs    12/05/21 2226  TSH 4.008   External labs:  03/22/2021: Hgb 14.6, HCT 42.4, MCV 95.0, platelet 191 Sodium 139, potassium 4.3, BUN 16, creatinine 0.73, ALT 16, AST 18, alk phos 67, GFR >90 LDL 89, total cholesterol 177, triglycerides 123, HDL 66,  02/17/2020 LDL 145, total cholesterol 229, triglycerides 136, HDL 62, non-HDL 167  Allergies  No Known Allergies   Medications Prior to Visit:   Outpatient Medications Prior to Visit  Medication Sig Dispense Refill   allopurinol (ZYLOPRIM) 100 MG tablet Take 100 mg by mouth in the morning.      apixaban (ELIQUIS) 5 MG TABS tablet Take 1 tablet (5 mg total) by  mouth 2 (two) times daily. 60 tablet 2   aspirin EC 81 MG EC tablet Take 1 tablet (81 mg total) by mouth daily. Swallow whole. 30 tablet 11   atorvastatin (LIPITOR) 10 MG tablet Take 10 mg by mouth daily.     diltiazem (CARDIZEM CD) 120 MG 24 hr capsule Take 2 capsules (240 mg total) by mouth daily. 60 capsule 2   diltiazem (CARDIZEM) 60 MG tablet Take 1 tablet (60 mg total) by mouth at  bedtime for 1 dose. Please take one 60 mg tablet at 1800 (6:00 pm) tonight. 1 tablet 0   Lactobacillus Rhamnosus, GG, (RA PROBIOTIC DIGESTIVE CARE) CAPS Take 1 capsule by mouth in the morning.     Multiple Vitamins-Minerals (DAILY MULTIVITAMIN PO) Take 1 tablet by mouth daily.     spironolactone (ALDACTONE) 25 MG tablet TAKE 1 TABLET (25 MG TOTAL) BY MOUTH DAILY. 90 tablet 3   tadalafil (CIALIS) 20 MG tablet Take 20 mg by mouth daily as needed for erectile dysfunction.      amiodarone (PACERONE) 200 MG tablet Take 1 tablet (200 mg total) by mouth 2 (two) times daily for 7 days, THEN 1 tablet (200 mg total) daily for 23 days. 37 tablet 0   No facility-administered medications prior to visit.   Final Medications at End of Visit    Current Meds  Medication Sig   allopurinol (ZYLOPRIM) 100 MG tablet Take 100 mg by mouth in the morning.    amiodarone (PACERONE) 200 MG tablet Take 1 tablet (200 mg total) by mouth daily.   apixaban (ELIQUIS) 5 MG TABS tablet Take 1 tablet (5 mg total) by mouth 2 (two) times daily.   aspirin EC 81 MG EC tablet Take 1 tablet (81 mg total) by mouth daily. Swallow whole.   atorvastatin (LIPITOR) 10 MG tablet Take 10 mg by mouth daily.   diltiazem (CARDIZEM CD) 120 MG 24 hr capsule Take 2 capsules (240 mg total) by mouth daily.   diltiazem (CARDIZEM) 60 MG tablet Take 1 tablet (60 mg total) by mouth at bedtime for 1 dose. Please take one 60 mg tablet at 1800 (6:00 pm) tonight.   Lactobacillus Rhamnosus, GG, (RA PROBIOTIC DIGESTIVE CARE) CAPS Take 1 capsule by mouth in the morning.    Multiple Vitamins-Minerals (DAILY MULTIVITAMIN PO) Take 1 tablet by mouth daily.   spironolactone (ALDACTONE) 25 MG tablet TAKE 1 TABLET (25 MG TOTAL) BY MOUTH DAILY.   tadalafil (CIALIS) 20 MG tablet Take 20 mg by mouth daily as needed for erectile dysfunction.    [DISCONTINUED] amiodarone (PACERONE) 200 MG tablet Take 1 tablet (200 mg total) by mouth 2 (two) times daily for 7 days, THEN 1 tablet (200 mg total) daily for 23 days.   Radiology:   No results found.   Chest x-ray 09/22/2020: 1. Marked reduction in size of the left pleural effusion status post thoracentesis. No pneumothorax. 2. Borderline enlargement of the cardiopericardial silhouette.  CT chest 09/23/2020: 1. Moderate size pericardial effusion has developed since the previous study. 2. Trace amount of right pleural fluid layering dependently with minimal dependent atelectasis. Small left effusion layering dependently with mild dependent atelectasis. 3. Small pulmonary emboli seen previously cannot be visualized in the absence of contrast. 4. Aortic atherosclerosis.  Coronary artery calcification.  Aortic Atherosclerosis (ICD10-I70.0).  Cardiac Studies:   Lower Extremity Venous Duplex  08/28/20   +---------+---------------+---------+-----------+----------+--------------+     Summary: BILATERAL: - No evidence of deep vein thrombosis seen in the lower extremities, bilaterally. - No evidence of superficial venous thrombosis in the lower extremities, bilaterally. -   *See table(s) above for measurements and observations.    Preliminary     CT Angio Chest PE W and/or Wo Contrast.  Date: 08/27/2020 CLINICAL DATA:  Shortness of breath and chest pain  FINDINGS: Cardiovascular: Satisfactory opacification of the pulmonary arteries to the segmental level. There are filling defects in segmental branches of bilateral upper and right middle lobes. Additional suspected filling defects within subsegmental branches.  Left atrial enlargement.  No evidence of right heart strain. No pericardial effusion. Mediastinum/Nodes: There are no enlarged lymph nodes identified. Thyroid and esophagus are unremarkable. Lungs/Pleura: Patchy areas of atelectasis. No pleural effusion or pneumothorax. Upper Abdomen: No acute abnormality. Musculoskeletal: No acute osseous abnormality. Review of the MIP images confirms the above findings.  IMPRESSION: Acute segmental and subsegmental pulmonary emboli bilaterally. No evidence of right heart strain. Patchy bilateral atelectasis.   Coronary angiography 09/25/2020 LM: Normal LAD: Type 1 LAD that does not reach apex          Medial calcification without any luminal stenosis         Large Diag 1 that reaches apex and also gives off septal perforators, making it a dual LAD system          No other diagonals present LCx: Normal Ramus: Normal RCA: Large, gives of several septal perforators, reaches apex         Minimal luminal irregularities No significant coronary artery disease  Echocardiogram 12/05/2021:  LVEF 60 to 65%, moderate LVH, no significant valvular heart disease, aortic root 38 mm ascending aorta 40 mm.   EKG:  12/06/2021: Normal sinus rhythm, 62 bpm, left axis, TWI in inferior lateral leads suggestive of possible ischemia, without underlying injury pattern.  12/05/2020: Atrial flutter with 2:1 conduction at a rate of 153 bpm.  EKG 09/17/2020: Typical atrial flutter with 2: conduction and ventricular rate of 160 bpm.  Normal axis.  Incomplete right bundle branch block.  Nonspecific T wave abnormality.  EKG 08/28/2020: Sinus tachycardia at rate of 100 bpm, left axis deviation, left intrafascicular block.  Early R wave transition in V2, probably lead placement but cannot exclude RVH.  Nonspecific T abnormality.  Compared to EKG done 2020, diffuse inferior and lateral T wave inversions suggestive of ischemia are no longer present.  No change in V2 R wave progression.  Abnormal EKG.  Assessment      ICD-10-CM   1. Paroxysmal atrial fibrillation (HCC)  I48.0 EKG 12-Lead    Ambulatory referral to Cardiac Electrophysiology    2. Atypical atrial flutter (HCC)  I48.4 Ambulatory referral to Cardiac Electrophysiology    3. Essential hypertension  I10     4. Long term (current) use of anticoagulants  Z79.01     5. Hypercholesteremia  E78.00        Medications Discontinued During This Encounter  Medication Reason   amiodarone (PACERONE) 200 MG tablet Dose change     Meds ordered this encounter  Medications   amiodarone (PACERONE) 200 MG tablet    Sig: Take 1 tablet (200 mg total) by mouth daily.    Dispense:  90 tablet    Refill:  3    This patients CHA2DS2-VASc Score 3 (HTN, vasc, age) and yearly risk of stroke 3.2%.   Recommendations:   Arion Shankles is a 66 y.o.  male with past medical history of hypertension, hyperlipidemia, tobacco use disorder, who presented to the emergency department 08/27/2020 in typical atrial flutter with RVR.  He underwent successful direct-current cardioversion.  Patient was then admitted to the hospital again 09/19/2020-09/26/2020 with pneumonia, pulmonary embolism, atrial flutter, and systolic heart failure.   Patient was last seen in the office 05/16/2021 advised to follow-up in 6 months.  However He was admitted 12/05/2021 - 12/06/2021 with atrial flutter with RVR.  Patient continued Eliquis, therefore resumed anticoagulation.  Patient was started on IV diltiazem and amiodarone, he subsequently converted to normal sinus rhythm and was discharged with  p.o. amiodarone and diltiazem.  Patient now presents for 1 week follow-up.  Patient is maintaining normal sinus rhythm and is relatively asymptomatic.  He will reduce amiodarone to 200 mg daily after tomorrow.  We will continue diltiazem and Eliquis.  Blood pressure was initially elevated in the office, however it is well controlled on recheck.  Given the patient has had recurrent episodes of atrial flutter  requiring hospitalization will refer to cardiac electrophysiology for further evaluation and recommendations.  In regard to hyperlipidemia, will continue statin therapy.  As patient is presently being treated with amiodarone, they will need annual monitoring of PFTs, thyroid function, liver function, and ophthalmologic exam. Patient is aware of these monitoring parameters and agrees.   Follow-up in 3 months, sooner if needed.     Alethia Berthold, PA-C 12/12/2021, 2:51 PM Office: [phone]

## 2021-12-11 NOTE — Patient Instructions (Signed)
Things for you to do:  -

## 2021-12-11 NOTE — Progress Notes (Addendum)
PCP: Roger Kill, PA-C  Subjective:   HPI: Patient is a 66 y.o. male here for right thumb pain and stiffness.   Patient reports R thumb stiffness for the past 3-4 months. Stiffness worse in the morning and occasionally has to pry his thumb straight and will occasionally hear it pop. Also endorses pain at the base of the thumb near his palm. States he has issues opening jars, gripping items. He states both hands stiff but attributes it to working a lot at the shop. 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 States he tried to bandage the top of the thumb to prevent it from bending. Has tried lasering his thumb which would help for a bit but would come back. Tried NSAIDs, topical gels, and tried prednisone which helped initially as well but the pain and stiffness returned.   Of note, patient on Eliquis.  Past Medical History:  Diagnosis Date   Hypercholesteremia    Primary hypertension    Pulmonary embolism ([facility]) 08/27/2020   Typical atrial flutter ([facility]) 08/27/2020 with PE    Current Outpatient Medications on File Prior to Visit  Medication Sig Dispense Refill   allopurinol (ZYLOPRIM) 100 MG tablet Take 100 mg by mouth in the morning.      amiodarone (PACERONE) 200 MG tablet Take 1 tablet (200 mg total) by mouth 2 (two) times daily for 7 days, THEN 1 tablet (200 mg total) daily for 23 days. 37 tablet 0   apixaban (ELIQUIS) 5 MG TABS tablet Take 1 tablet (5 mg total) by mouth 2 (two) times daily. 60 tablet 2   aspirin EC 81 MG EC tablet Take 1 tablet (81 mg total) by mouth daily. Swallow whole. 30 tablet 11   atorvastatin (LIPITOR) 10 MG tablet Take 10 mg by mouth daily.     diltiazem (CARDIZEM CD) 120 MG 24 hr capsule Take 2 capsules (240 mg total) by mouth daily. 60 capsule 2   diltiazem (CARDIZEM) 60 MG tablet Take 1 tablet (60 mg total) by mouth at bedtime for 1 dose. Please take one 60 mg tablet at 1800 (6:00 pm) tonight. 1 tablet 0    Lactobacillus Rhamnosus, GG, (RA PROBIOTIC DIGESTIVE CARE) CAPS Take 1 capsule by mouth in the morning.     Multiple Vitamins-Minerals (DAILY MULTIVITAMIN PO) Take 1 tablet by mouth daily.     spironolactone (ALDACTONE) 25 MG tablet TAKE 1 TABLET (25 MG TOTAL) BY MOUTH DAILY. 90 tablet 3   tadalafil (CIALIS) 20 MG tablet Take 20 mg by mouth daily as needed for erectile dysfunction.      No current facility-administered medications on file prior to visit.    Past Surgical History:  Procedure Laterality Date   IR THORACENTESIS ASP PLEURAL SPACE W/IMG GUIDE  09/20/2020   LAPAROTOMY     LEFT HEART CATH AND CORONARY ANGIOGRAPHY N/A 09/25/2020   Procedure: LEFT HEART CATH AND CORONARY ANGIOGRAPHY;  Surgeon: Elder Negus, MD;  Location: [facility];  Service: Cardiovascular;  Laterality: N/A;    No Known Allergies  BP 130/86    Ht 5\' 10"  (1.778 m)    Wt 232 lb (105.2 kg)    BMI 33.29 kg/m   [facility] 12/11/2021  Frequency of aerobic exercise (# of days/week) 4  Average time in minutes 30  Frequency of strengthening activities (# of days/week) 3    No flowsheet data found.  Objective:  Physical Exam:  Gen: NAD, comfortable in exam room Resp: Normal WOB on RA  Derm: Warm, dry. No visible rashes or lesions Neuro: No focal deficits. Sensation of extremities in tact bilaterally  MSK: Base of R thumb MCP joint without erythema and with mild edema. Positive TTP at volar base of thumb MCP joint with a palpable nodule. Significantly reduced ROM of R thumb. Full ROM of wrist. 5/5 grip strength without use of thumb.     Assessment & Plan:  1. Trigger finger, R thumb  Procedure: Trigger finger Injection, Right thumb finger After discussion on R/B/I and informed verbal consent, the area of tenderness at the A1 pulley was identified with palpable nodule located and marked. This area was cleaned with Betadine swab and alcohol swabs. After sterile  precautions were taken, a 25-gauge, 5/8" needle was inserted into the A1 pulley with 0.5cc:0.5cc mixture of Lidocaine 1% and Methylprednisolone 40mg /mL. Patient tolerated procedure well and was observed for at least 5 minutes after injection with resolution of pain and improved ROM.   Plan: - Trigger finger (thumb) CS injection provided today - Applied double band-aid to IP of thumb to prevent flexion, to remain for next 2-3 days until CS kicks in - may use ice and/or tylenol for any post-injection pain - Discussed activity modification and rest for the next few days until her pain and triggering improved - Discussed with the patient that most of the time a one-time injection will completely resolve the triggering, although occasionally may need a second injection months later if returns.  He may follow-up on an as-needed basis.  Patient was seen and evaluated with: Dr. , PGY-2.   Franchot Erichsen, DO PGY-4, Sports Medicine Fellow [facility]

## 2021-12-12 ENCOUNTER — Other Ambulatory Visit: Payer: Self-pay

## 2021-12-12 ENCOUNTER — Other Ambulatory Visit (HOSPITAL_BASED_OUTPATIENT_CLINIC_OR_DEPARTMENT_OTHER): Payer: Self-pay

## 2021-12-12 ENCOUNTER — Encounter: Payer: Self-pay | Admitting: Student

## 2021-12-12 ENCOUNTER — Other Ambulatory Visit (HOSPITAL_COMMUNITY): Payer: Self-pay

## 2021-12-12 ENCOUNTER — Ambulatory Visit: Payer: Federal, State, Local not specified - PPO | Admitting: Student

## 2021-12-12 VITALS — BP 128/74 | HR 82 | Temp 98.0°F | Ht 70.0 in | Wt 241.0 lb

## 2021-12-12 DIAGNOSIS — I484 Atypical atrial flutter: Secondary | ICD-10-CM

## 2021-12-12 DIAGNOSIS — I480 Paroxysmal atrial fibrillation: Secondary | ICD-10-CM

## 2021-12-12 DIAGNOSIS — Z7901 Long term (current) use of anticoagulants: Secondary | ICD-10-CM

## 2021-12-12 DIAGNOSIS — E7800 Pure hypercholesterolemia, unspecified: Secondary | ICD-10-CM

## 2021-12-12 DIAGNOSIS — I10 Essential (primary) hypertension: Secondary | ICD-10-CM

## 2021-12-12 MED ORDER — AMIODARONE HCL 200 MG PO TABS: 200.0000 mg | ORAL_TABLET | Freq: Every day | ORAL | 3 refills | Status: DC

## 2021-12-26 ENCOUNTER — Ambulatory Visit: Payer: Federal, State, Local not specified - PPO | Admitting: Student

## 2022-02-10 ENCOUNTER — Other Ambulatory Visit: Payer: Self-pay | Admitting: Internal Medicine

## 2022-02-14 ENCOUNTER — Other Ambulatory Visit: Payer: Self-pay | Admitting: Internal Medicine

## 2022-02-18 NOTE — Progress Notes (Deleted)
?Electrophysiology Office Note:   ? ?Date:  02/18/2022  ? ?ID:  Carl [name], DOB [birth year], MRN [id number] ? ?PCP:  Carl Kill, PA-C  ?[facility] Cardiologist:  None  ?[facility] Electrophysiologist:  None  ? ?Referring MD: Carl Halsted, PA*  ? ?Chief Complaint: Atrial flutter ? ?History of Present Illness:   ? ?Carl [name] is a 66 y.o. male who presents for an evaluation of atrial flutter at the request of Carl So, PA-C. Their medical history includes hypertension, hyperlipidemia, tobacco use and multiple hospitalizations for atrial flutter with rapid ventricular rates.  He has been hospitalized in October 2021, November 2021, January 2023 all for atrial flutter with rapid ventricular rates.  He is on anticoagulation for stroke prophylaxis.  He presents today to discuss treatment options for his atrial arrhythmias. ? ? ?  ?Past Medical History:  ?Diagnosis Date  ? Hypercholesteremia   ? Primary hypertension   ? Pulmonary embolism (HCC) 08/27/2020  ? Typical atrial flutter (HCC) 08/27/2020 with PE  ? ? ?Past Surgical History:  ?Procedure Laterality Date  ? IR THORACENTESIS ASP PLEURAL SPACE W/IMG GUIDE  09/20/2020  ? LAPAROTOMY    ? LEFT HEART CATH AND CORONARY ANGIOGRAPHY N/A 09/25/2020  ? Procedure: LEFT HEART CATH AND CORONARY ANGIOGRAPHY;  Surgeon: Carl Negus, MD;  Location: [facility];  Service: Cardiovascular;  Laterality: N/A;  ? ? ?Current Medications: ?No outpatient medications have been marked as taking for the 02/19/22 encounter (Appointment) with Carl Prude, MD.  ?  ? ?Allergies:   Patient has no known allergies.  ? ?Social History  ? ?Socioeconomic History  ? Marital status: Widowed  ?  Spouse name: Not on file  ? Number of children: 2  ? Years of education: Not on file  ? Highest education level: Not on file  ?Occupational History  ? Occupation: retired  ?Tobacco Use  ? Smoking status: Some Days  ?  Types: Cigars  ? Smokeless tobacco: Never   ? Tobacco comments:  ?  Once a week  ?Vaping Use  ? Vaping Use: Never used  ?Substance and Sexual Activity  ? Alcohol use: Yes  ?  Alcohol/week: 1.0 standard drink  ?  Types: 1 Glasses of wine per week  ?  Comment: Occasional  ? Drug use: Never  ? Sexual activity: Not on file  ?Other Topics Concern  ? Not on file  ?Social History Narrative  ? Not on file  ? ?Social Determinants of Health  ? ?Financial Resource Strain: Not on file  ?Food Insecurity: Not on file  ?Transportation Needs: Not on file  ?Physical Activity: Not on file  ?Stress: Not on file  ?Social Connections: Not on file  ?  ? ?Family History: ?The patient's family history includes CAD (age of onset: 65) in his mother; Cancer in his sister; Heart attack in his brother and father; Heart disease in his brother; Heart failure in his brother. ? ?ROS:   ?Please see the history of present illness.    ?All other systems reviewed and are negative. ? ?EKGs/Labs/Other Studies Reviewed:   ? ?The following studies were reviewed today: ? ?January 2023 echo ?Left ventricular function normal, 60% ?Moderate concentric LVH ?Normal RV function ?No MR ?No AS ? ?November 2021 left heart cath ?No significant coronary artery disease ? ? ?August 27, 2020 EKG shows typical appearing atrial flutter with a ventricular rate of 170 bpm, right bundle branch block, left anterior fascicular block, 2-1 AV conduction ? ?  August 28, 2020 EKG shows sinus rhythm, left anterior fascicular block ? ?September 17, 2020 EKG shows typical atrial flutter with 2 1 AV conduction ? ?December 05, 2021 EKG shows typical appearing atrial flutter with 2 1 AV conduction ? ? ? ?EKG:  The ekg ordered today demonstrates *** ? ? ?Recent Labs: ?12/05/2021: ALT 15; BUN 9; Creatinine, Ser 0.83; Magnesium 2.0; Potassium 3.8; Sodium 138; TSH 4.008 ?12/06/2021: Hemoglobin 14.1; Platelets 211  ?Recent Lipid Panel ?No results found for: CHOL, TRIG, HDL, CHOLHDL, VLDL, LDLCALC, LDLDIRECT ? ?Physical Exam:   ? ?VS:   There were no vitals taken for this visit.   ? ?Wt Readings from Last 3 Encounters:  ?12/12/21 241 lb (109.3 kg)  ?12/11/21 232 lb (105.2 kg)  ?12/05/21 234 lb (106.1 kg)  ?  ? ?GEN: *** Well nourished, well developed in no acute distress ?HEENT: Normal ?NECK: No JVD; No carotid bruits ?LYMPHATICS: No lymphadenopathy ?CARDIAC: ***RRR, no murmurs, rubs, gallops ?RESPIRATORY:  Clear to auscultation without rales, wheezing or rhonchi  ?ABDOMEN: Soft, non-tender, non-distended ?MUSCULOSKELETAL:  No edema; No deformity  ?SKIN: Warm and dry ?NEUROLOGIC:  Alert and oriented x 3 ?PSYCHIATRIC:  Normal affect  ? ? ?  ? ?ASSESSMENT:   ? ?No diagnosis found. ?PLAN:   ? ?In order of problems listed above: ? ? ?CTI ablation ?3 months after stopping Amio ? ? ? ? ? ?Total time spent with patient today *** minutes. This includes reviewing records, evaluating the patient and coordinating care. ? ?Medication Adjustments/Labs and Tests Ordered: ?Current medicines are reviewed at length with the patient today.  Concerns regarding medicines are outlined above.  ?No orders of the defined types were placed in this encounter. ? ?No orders of the defined types were placed in this encounter. ? ? ? ?Signed, ?Carl Lang T. Lalla Brothers, MD, [facility], [facility] ?02/18/2022 10:46 PM    ?Electrophysiology ?[facility] ?

## 2022-02-19 ENCOUNTER — Encounter: Payer: Self-pay | Admitting: *Deleted

## 2022-02-19 ENCOUNTER — Ambulatory Visit: Payer: Federal, State, Local not specified - PPO | Admitting: Cardiology

## 2022-02-19 ENCOUNTER — Encounter: Payer: Self-pay | Admitting: Cardiology

## 2022-02-19 VITALS — BP 148/80 | HR 77 | Ht 70.0 in | Wt 240.2 lb

## 2022-02-19 DIAGNOSIS — I5021 Acute systolic (congestive) heart failure: Secondary | ICD-10-CM

## 2022-02-19 DIAGNOSIS — I10 Essential (primary) hypertension: Secondary | ICD-10-CM

## 2022-02-19 DIAGNOSIS — I4892 Unspecified atrial flutter: Secondary | ICD-10-CM

## 2022-02-19 NOTE — Patient Instructions (Addendum)
Medication Instructions:  ?Your physician recommends that you continue on your current medications as directed. Please refer to the Current Medication list given to you today. ?*If you need a refill on your cardiac medications before your next appointment, please call your pharmacy* ? ?Lab Work: ?None. ?If you have labs (blood work) drawn today and your tests are completely normal, you will receive your results only by: ?MyChart Message (if you have MyChart) OR ?A paper copy in the mail ?If you have any lab test that is abnormal or we need to change your treatment, we will call you to review the results. ? ?Testing/Procedures: ?Your physician has recommended that you have an ablation. Catheter ablation is a medical procedure used to treat some cardiac arrhythmias (irregular heartbeats). During catheter ablation, a long, thin, flexible tube is put into a blood vessel in your groin (upper thigh), or neck. This tube is called an ablation catheter. It is then guided to your heart through the blood vessel. Radio frequency waves destroy small areas of heart tissue where abnormal heartbeats may cause an arrhythmia to start. Please see the instruction sheet given to you today. ? ? ?Follow-Up: ?At [facility], you and your health needs are our priority.  As part of our continuing mission to provide you with exceptional heart care, we have created designated Provider Care Teams.  These Care Teams include your primary Cardiologist (physician) and Advanced Practice Providers (APPs -  Physician Assistants and Nurse Practitioners) who all work together to provide you with the care you need, when you need it. ? ?Your physician wants you to follow-up in: see instruction letter.  ? ?We recommend signing up for the patient portal called "MyChart".  Sign up information is provided on this After Visit Summary.  MyChart is used to connect with patients for Virtual Visits (Telemedicine).  Patients are able to view lab/test results,  encounter notes, upcoming appointments, etc.  Non-urgent messages can be sent to your provider as well.   ?To learn more about what you can do with MyChart, go to [email or web address].   ? ?Any Other Special Instructions Will Be Listed Below (If Applicable). ? ?Cardiac Ablation ?Cardiac ablation is a procedure to destroy (ablate) some heart tissue that is sending bad signals. These bad signals cause problems in heart rhythm. ?The heart has many areas that make these signals. If there are problems in these areas, they can make the heart beat in a way that is not normal. Destroying some tissues can help make the heart rhythm normal. ?Tell your doctor about: ?Any allergies you have. ?All medicines you are taking. These include vitamins, herbs, eye drops, creams, and over-the-counter medicines. ?Any problems you or family members have had with medicines that make you fall asleep (anesthetics). ?Any blood disorders you have. ?Any surgeries you have had. ?Any medical conditions you have, such as kidney failure. ?Whether you are pregnant or may be pregnant. ?What are the risks? ?This is a safe procedure. But problems may occur, including: ?Infection. ?Bruising and bleeding. ?Bleeding into the chest. ?Stroke or blood clots. ?Damage to nearby areas of your body. ?Allergies to medicines or dyes. ?The need for a pacemaker if the normal system is damaged. ?Failure of the procedure to treat the problem. ?What happens before the procedure? ?Medicines ?Ask your doctor about: ?Changing or stopping your normal medicines. This is important. ?Taking aspirin and ibuprofen. Do not take these medicines unless your doctor tells you to take them. ?Taking other medicines, vitamins, herbs, and supplements. ?General  instructions ?Follow instructions from your doctor about what you cannot eat or drink. ?Plan to have someone take you home from the hospital or clinic. ?If you will be going home right after the procedure, plan to have someone  with you for 24 hours. ?Ask your doctor what steps will be taken to prevent infection. ?What happens during the procedure? ? ?An IV tube will be put into one of your veins. ?You will be given a medicine to help you relax. ?The skin on your neck or groin will be numbed. ?A cut (incision) will be made in your neck or groin. A needle will be put through your cut and into a large vein. ?A tube (catheter) will be put into the needle. The tube will be moved to your heart. ?Dye may be put through the tube. This helps your doctor see your heart. ?Small devices (electrodes) on the tube will send out signals. ?A type of energy will be used to destroy some heart tissue. ?The tube will be taken out. ?Pressure will be held on your cut. This helps stop bleeding. ?A bandage will be put over your cut. ?The exact procedure may vary among doctors and hospitals. ?What happens after the procedure? ?You will be watched until you leave the hospital or clinic. This includes checking your heart rate, breathing rate, oxygen, and blood pressure. ?Your cut will be watched for bleeding. You will need to lie still for a few hours. ?Do not drive for 24 hours or as long as your doctor tells you. ?Summary ?Cardiac ablation is a procedure to destroy some heart tissue. This is done to treat heart rhythm problems. ?Tell your doctor about any medical conditions you may have. Tell him or her about all medicines you are taking to treat them. ?This is a safe procedure. But problems may occur. These include infection, bruising, bleeding, and damage to nearby areas of your body. ?Follow what your doctor tells you about food and drink. You may also be told to change or stop some of your medicines. ?After the procedure, do not drive for 24 hours or as long as your doctor tells you. ?This information is not intended to replace advice given to you by your health care provider. Make sure you discuss any questions you have with your health care  provider. ?Document Revised: 09/29/2019 Document Reviewed: 09/29/2019 ?Elsevier Patient Education ? 2022 [place]. ? ? ? ?  ? ? ?

## 2022-02-19 NOTE — Progress Notes (Signed)
?Electrophysiology Office Note:   ? ?Date:  02/19/2022  ? ?ID:  Carl [name], DOB [birth year], MRN [id number] ? ?PCP:  Heywood Bene, PA-C  ?[facility] Cardiologist:  None  ?[facility] Electrophysiologist:  Vickie Epley, MD  ? ?Referring MD: Alethia Berthold, PA*  ? ?Chief Complaint: Atrial flutter ? ?History of Present Illness:   ? ?Carl [name] is a 66 y.o. male who presents for an evaluation of atrial flutter at the request of Lawerance Cruel, PA-C. Their medical history includes hypertension, hyperlipidemia, tobacco use and multiple hospitalizations for atrial flutter with rapid ventricular rates.  He has been hospitalized in October 2021, November 2021, January 2023 all for atrial flutter with rapid ventricular rates.  He is on anticoagulation for stroke prophylaxis.  He presents today to discuss treatment options for his atrial arrhythmias. ? ?Today, he appears well. Since starting amiodarone, he denies any recurrent arrhythmias. ? ?Lately he always seems fatigued and it is difficult for him to feel motivated. He states this is not typical for him, as he is usually involved with constant maintenance and yard work at home. ? ?He is doing well keeping up with some exercise and trying to follow a healthier diet. ? ?Currently on 5 mg Eliquis twice daily. ? ?He denies any palpitations, chest pain, shortness of breath, or peripheral edema. No lightheadedness, headaches, syncope, orthopnea, or PND. ? ? ?  ?Past Medical History:  ?Diagnosis Date  ? Hypercholesteremia   ? Primary hypertension   ? Pulmonary embolism ([place]) 08/27/2020  ? Typical atrial flutter ([place]) 08/27/2020 with PE  ? ? ?Past Surgical History:  ?Procedure Laterality Date  ? IR THORACENTESIS ASP PLEURAL SPACE W/IMG GUIDE  09/20/2020  ? LAPAROTOMY    ? LEFT HEART CATH AND CORONARY ANGIOGRAPHY N/A 09/25/2020  ? Procedure: LEFT HEART CATH AND CORONARY ANGIOGRAPHY;  Surgeon: Nigel Mormon, MD;  Location: [facility];   Service: Cardiovascular;  Laterality: N/A;  ? ? ?Current Medications: ?Current Meds  ?Medication Sig  ? allopurinol (ZYLOPRIM) 100 MG tablet Take 100 mg by mouth in the morning.   ? amiodarone (PACERONE) 200 MG tablet Take 1 tablet (200 mg total) by mouth daily.  ? apixaban (ELIQUIS) 5 MG TABS tablet Take 1 tablet (5 mg total) by mouth 2 (two) times daily.  ? aspirin EC 81 MG EC tablet Take 1 tablet (81 mg total) by mouth daily. Swallow whole.  ? atorvastatin (LIPITOR) 10 MG tablet Take 10 mg by mouth daily.  ? diltiazem (CARDIZEM CD) 120 MG 24 hr capsule Take 2 capsules (240 mg total) by mouth daily.  ? Lactobacillus Rhamnosus, GG, (RA PROBIOTIC DIGESTIVE CARE) CAPS Take 1 capsule by mouth in the morning.  ? Multiple Vitamins-Minerals (DAILY MULTIVITAMIN PO) Take 1 tablet by mouth daily.  ? spironolactone (ALDACTONE) 25 MG tablet TAKE 1 TABLET (25 MG TOTAL) BY MOUTH DAILY.  ? tadalafil (CIALIS) 20 MG tablet Take 20 mg by mouth daily as needed for erectile dysfunction.   ?  ? ?Allergies:   Patient has no known allergies.  ? ?Social History  ? ?Socioeconomic History  ? Marital status: Widowed  ?  Spouse name: Not on file  ? Number of children: 2  ? Years of education: Not on file  ? Highest education level: Not on file  ?Occupational History  ? Occupation: retired  ?Tobacco Use  ? Smoking status: Some Days  ?  Types: Cigars  ? Smokeless tobacco: Never  ? Tobacco comments:  ?  Once a week  ?Vaping Use  ? Vaping Use: Never used  ?Substance and Sexual Activity  ? Alcohol use: Yes  ?  Alcohol/week: 1.0 standard drink  ?  Types: 1 Glasses of wine per week  ?  Comment: Occasional  ? Drug use: Never  ? Sexual activity: Not on file  ?Other Topics Concern  ? Not on file  ?Social History Narrative  ? Not on file  ? ?Social Determinants of Health  ? ?Financial Resource Strain: Not on file  ?Food Insecurity: Not on file  ?Transportation Needs: Not on file  ?Physical Activity: Not on file  ?Stress: Not on file  ?Social  Connections: Not on file  ?  ? ?Family History: ?The patient's family history includes CAD (age of onset: 45) in his mother; Cancer in his sister; Heart attack in his brother and father; Heart disease in his brother; Heart failure in his brother. ? ?ROS:   ?Please see the history of present illness.    ?(+) Fatigue/Malaise ?All other systems reviewed and are negative. ? ?EKGs/Labs/Other Studies Reviewed:   ? ?The following studies were reviewed today: ? ?January 2023 echo ?Left ventricular function normal, 60% ?Moderate concentric LVH ?Normal RV function ?No MR ?No AS ? ?December 05, 2021, CTA Chest ?IMPRESSION: ?1. Chronic-appearing RIGHT upper lobe apical segmental pulmonary ?embolus. No additional evidence to suggest acute segmental or larger ?pulmonary emboli. ?2. Main pulmonary artery dilation, likely reflecting underlying ?pulmonary arterial hypertension. ?3. 4.3 cm ascending thoracic aorta. Recommend annual imaging ?followup by CTA or MRA. ?This recommendation follows 2010 ?ACCF/AHA/AATS/ACR/ASA/SCA/SCAI/SIR/STS/SVM Guidelines for the ?Diagnosis and Management of Patients with Thoracic Aortic Disease. ?Circulation. 2010; [id number]. Aortic aneurysm NOS (ICD10-I71.9) ?4. Coronary atherosclerosis, with greatest burden within the LAD. ?5.  Additional incidental, chronic and senescent findings as above. ? ?November 2021 left heart cath ?No significant coronary artery disease ? ? ? ?August 27, 2020 EKG shows typical appearing atrial flutter with a ventricular rate of 170 bpm, right bundle branch block, left anterior fascicular block, 2-1 AV conduction ? ?August 28, 2020 EKG shows sinus rhythm, left anterior fascicular block ? ?September 17, 2020 EKG shows typical atrial flutter with 2 1 AV conduction ? ?December 05, 2021 EKG shows typical appearing atrial flutter with 2 1 AV conduction ? ?EKG:  EKG is personally reviewed. ?02/19/2022: sinus rhythm ? ?Recent Labs: ?12/05/2021: ALT 15; BUN 9; Creatinine, Ser 0.83;  Magnesium 2.0; Potassium 3.8; Sodium 138; TSH 4.008 ?12/06/2021: Hemoglobin 14.1; Platelets 211  ? ?Recent Lipid Panel ?No results found for: CHOL, TRIG, HDL, CHOLHDL, VLDL, LDLCALC, LDLDIRECT ? ?Physical Exam:   ? ?VS:  BP (!) 148/80   Pulse 77   Ht 5\' 10"  (1.778 m)   Wt 240 lb 3.2 oz (109 kg)   SpO2 95%   BMI 34.47 kg/m?    ? ?Wt Readings from Last 3 Encounters:  ?02/19/22 240 lb 3.2 oz (109 kg)  ?12/12/21 241 lb (109.3 kg)  ?12/11/21 232 lb (105.2 kg)  ?  ? ?GEN: Well nourished, well developed in no acute distress ?HEENT: Normal ?NECK: No JVD; No carotid bruits ?LYMPHATICS: No lymphadenopathy ?CARDIAC: RRR, no murmurs, rubs, gallops ?RESPIRATORY:  Clear to auscultation without rales, wheezing or rhonchi  ?ABDOMEN: Soft, non-tender, non-distended ?MUSCULOSKELETAL:  No edema; No deformity  ?SKIN: Warm and dry ?NEUROLOGIC:  Alert and oriented x 3 ?PSYCHIATRIC:  Normal affect  ? ? ?  ? ?ASSESSMENT:   ? ?1. Paroxysmal atrial flutter ([place])   ?2. Acute systolic CHF (  congestive heart failure) ([place])   ?3. Primary hypertension   ? ?PLAN:   ? ?In order of problems listed above: ? ?#Atrial flutter, typical ?Discussed treatment options with the patient including continuing amiodarone versus pursuing typical flutter ablation.  He would like to proceed with catheter ablation in an effort to avoid long-term exposure to amiodarone.  I discussed the atrial flutter ablation in detail with the patient including the risk, recovery and likelihood of success.  I discussed the chances of developing atrial fibrillation after a successful atrial flutter ablation.  He would like to proceed with scheduling. ? ?Risk, benefits, and alternatives to EP study and radiofrequency ablation for atrial flutter were also discussed in detail today. These risks include but are not limited to stroke, bleeding, vascular damage, tamponade, perforation, damage to the esophagus, lungs, and other structures, pulmonary vein stenosis, worsening renal  function, and death. The patient understands these risk and wishes to proceed.  We will therefore proceed with catheter ablation at the next available time.  Carto, ICE, anesthesia are requested for the procedure.   ? ? ?To

## 2022-03-02 IMAGING — CT CT ANGIO CHEST
2 of 7 series · 16 of 46 positions shown · IV contrast (APPLIED)
Comparison: Chest XR, concurrent. CT chest, 09/23/2020. CTA PE,
08/27/2020.

CLINICAL DATA: Pulmonary embolism (PE) suspected, high prob.

Onset of upper chest heaviness yesterday. Hx of PEs. Pt was taken
off of Eliquis in August 2021
EXAM:
CT ANGIOGRAPHY CHEST WITH CONTRAST
TECHNIQUE: Multidetector CT imaging of the chest was performed using the
standard protocol during bolus administration of intravenous
contrast. Multiplanar CT image reconstructions and MIPs were
obtained to evaluate the vascular anatomy.

[Series 7: thins · axial · 0.83mm/px · z∈[+1159,+1363]mm · 13 of 329 slices shown]
[im 19/329  lung]
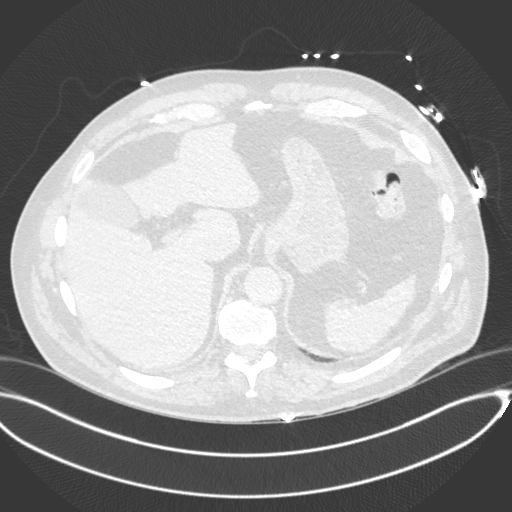
[im 37/329  soft-tissue]
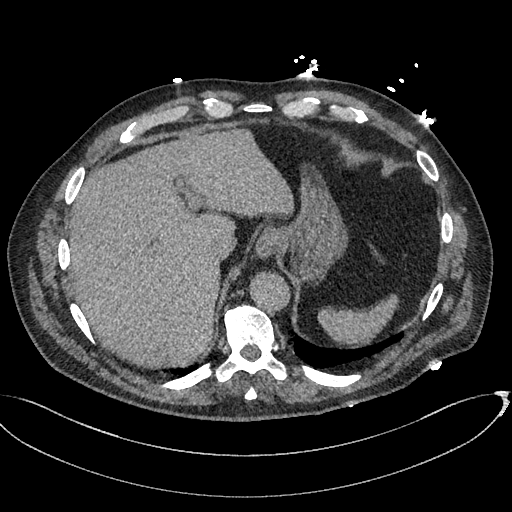
[im 73/329  lung]
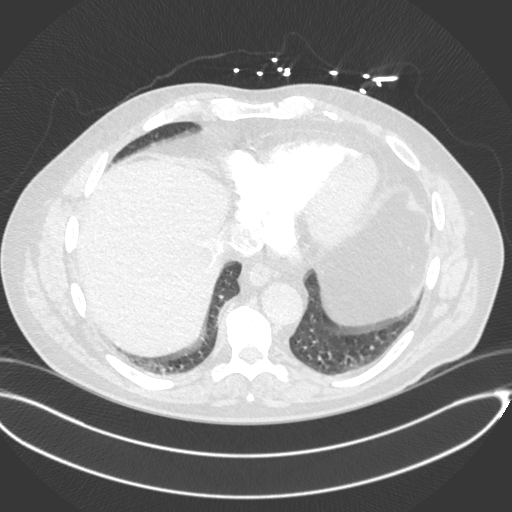
[im 92/329  soft-tissue]
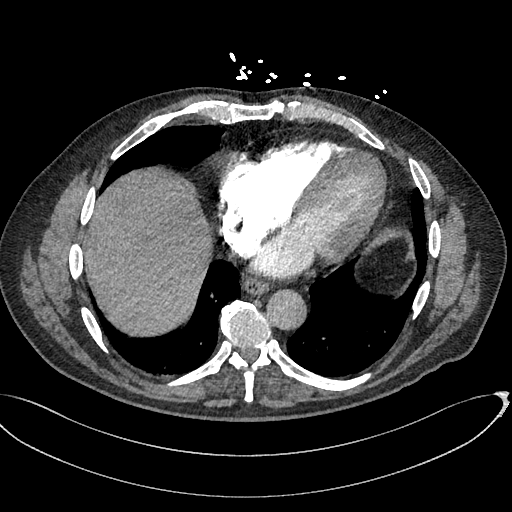
[im 110/329  lung]
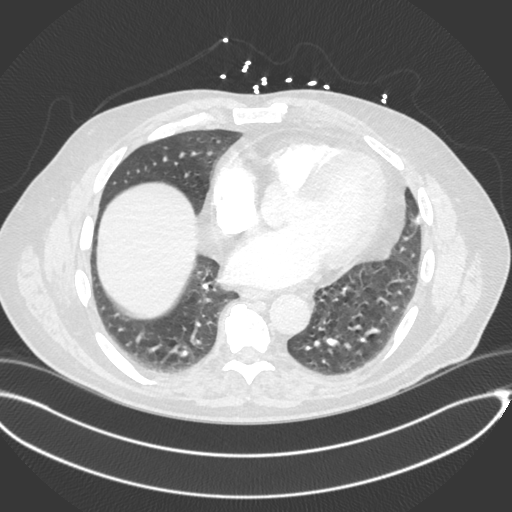
[im 146/329  soft-tissue]
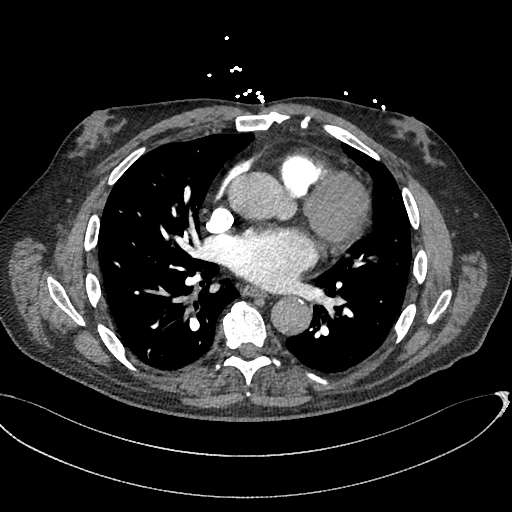
[im 165/329  lung]
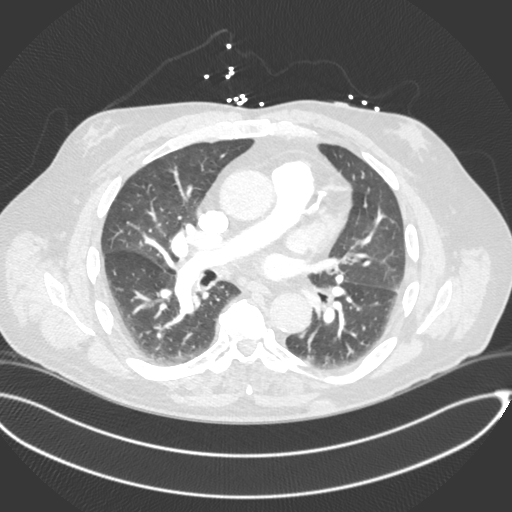
[im 183/329  soft-tissue]
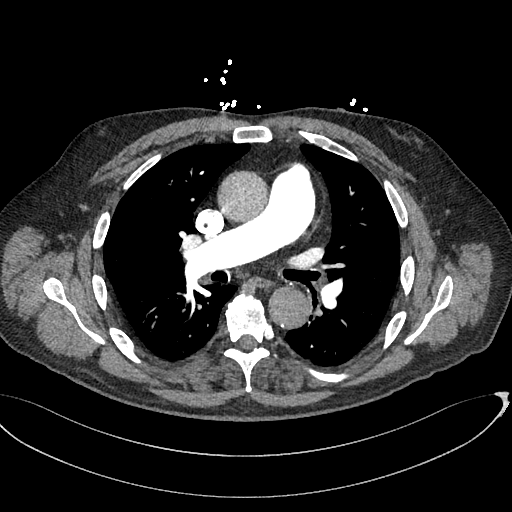
[im 219/329  lung]
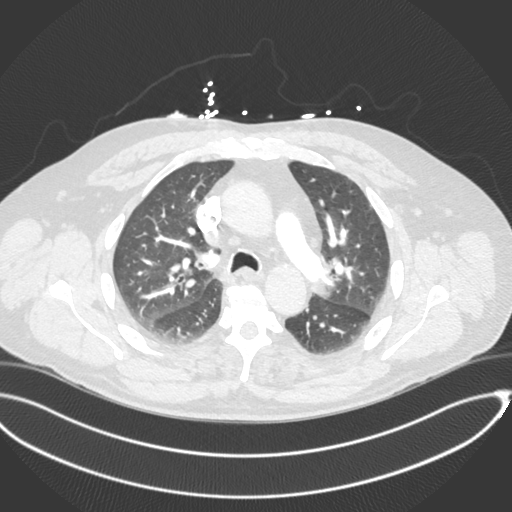
[im 237/329  soft-tissue]
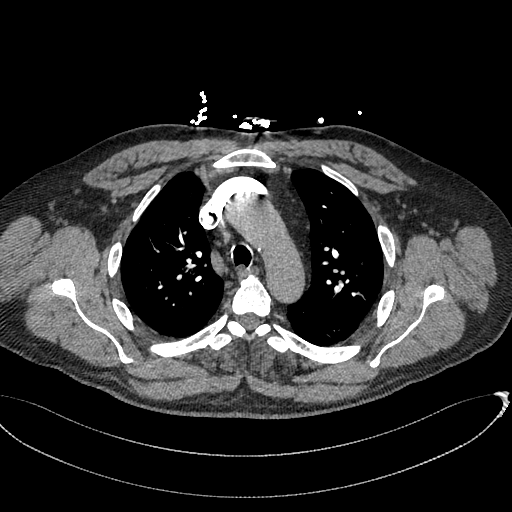
[im 256/329  lung]
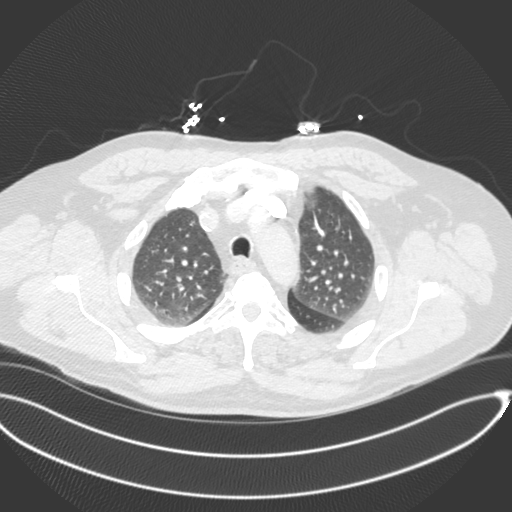
[im 292/329  soft-tissue]
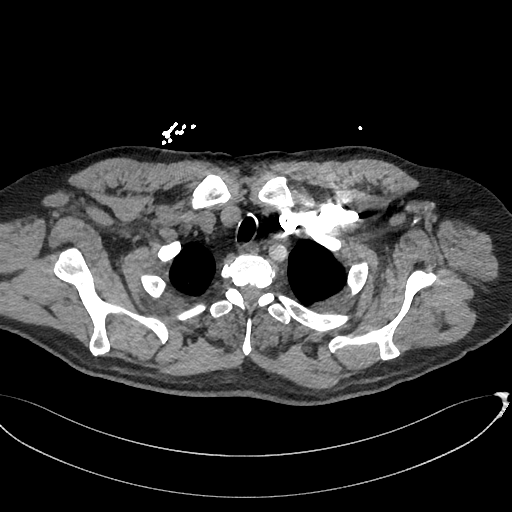
[im 310/329  lung]
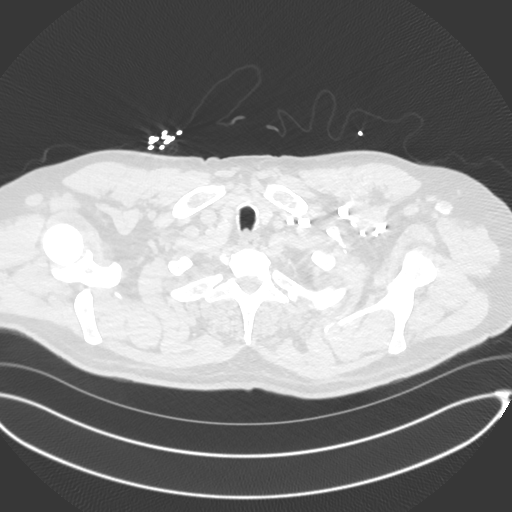

[Series 8: cor · coronal · 0.48mm/px · 3 of 163 slices shown]
[im 41/163  soft-tissue]
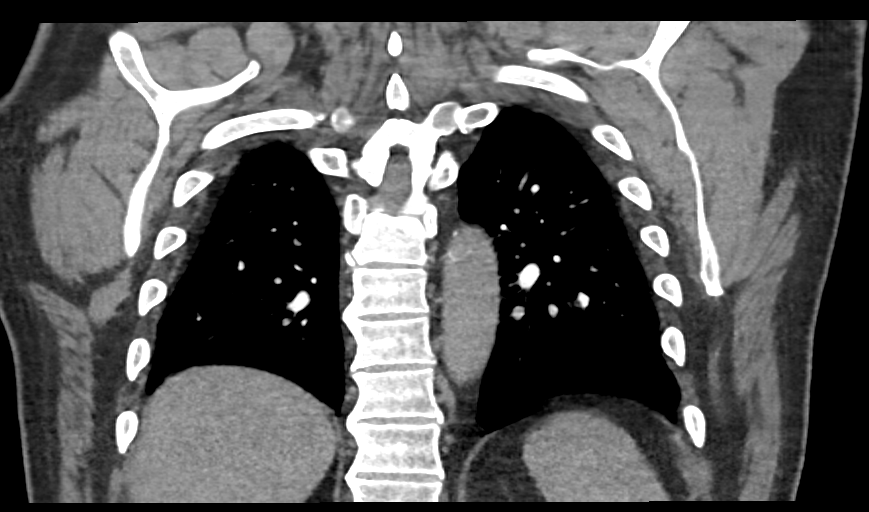
[im 82/163  soft-tissue]
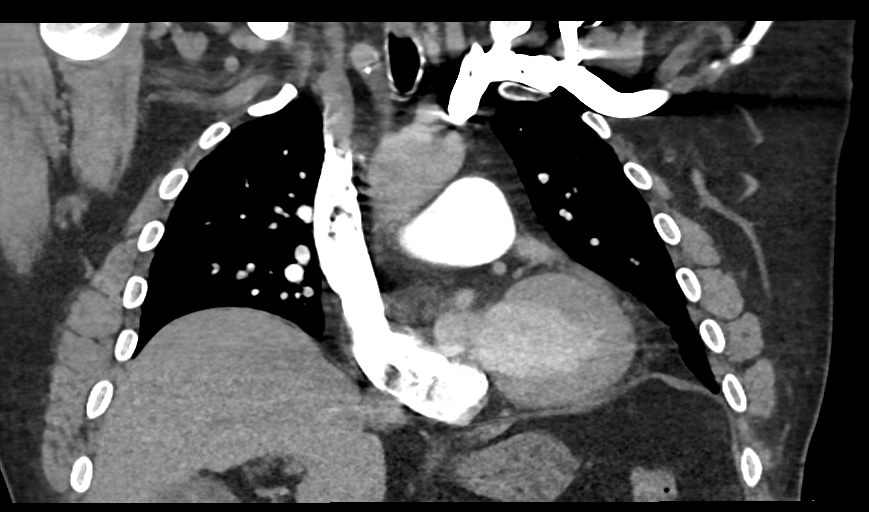
[im 122/163  soft-tissue]
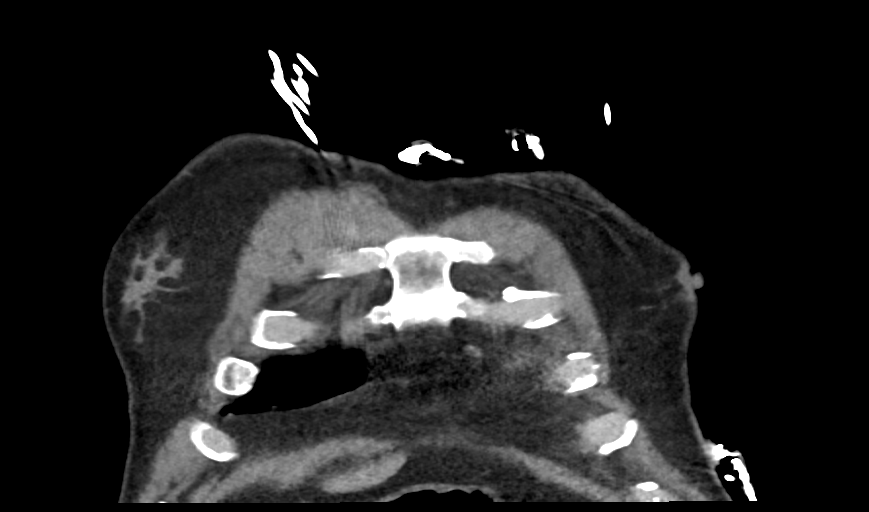

[16 of 46 positions shown; findings below may reference images not displayed]

RADIATION DOSE REDUCTION: This exam was performed according to the
departmental dose-optimization program which includes automated
exposure control, adjustment of the mA and/or kV according to
patient size and/or use of iterative reconstruction technique.

CONTRAST:  75mL OMNIPAQUE IOHEXOL 350 MG/ML SOLN
FINDINGS: Suboptimal evaluation, secondary to motion artifact and poor
contrast opacification such that subsegmental emboli could missed.

Cardiovascular:

Satisfactory opacification of the pulmonary arteries to the
segmental level.

Partially-occlusive web-like filling defects within the RIGHT upper
lobe apical segmental branch. See key image. This was present on
[DATE] comparison.

No additional evidence to suggest acute segmental or larger
pulmonary embolus.

Dilated main PA, measuring up to 3.7 cm.

4.3 cm ascending thoracic aorta.

Normal heart size. No pericardial effusion. Coronary
atherosclerosis, greatest burden within the LAD.

Mediastinum/Nodes: No enlarged mediastinal, hilar, or axillary lymph
nodes. Thyroid gland, trachea, and esophagus demonstrate no
significant findings.

Lungs/Pleura: Hypoinflation with trace bilateral atelectasis. No
suspicious pulmonary nodule or mass. No pleural effusion or
pneumothorax.

Upper Abdomen: No acute abnormality.

Musculoskeletal: Gynecomastia.  No acute osseous findings.

Review of the MIP images confirms the above findings.
IMPRESSION: 1. Chronic-appearing RIGHT upper lobe apical segmental pulmonary
embolus. No additional evidence to suggest acute segmental or larger
pulmonary emboli.
2. Main pulmonary artery dilation, likely reflecting underlying
pulmonary arterial hypertension.
3. 4.3 cm ascending thoracic aorta. Recommend annual imaging
followup by CTA or MRA.
This recommendation follows 9222
ACCF/AHA/AATS/ACR/ASA/SCA/CAVE/JHEMBOY/DE ALMEIDA/RENNA Guidelines for the
Diagnosis and Management of Patients with Thoracic Aortic Disease.
Circulation. 9222; 121: [id number]-e369. Aortic aneurysm NOS ([id number].B)
4. Coronary atherosclerosis, with greatest burden within the LAD.
5.  Additional incidental, chronic and senescent findings as above.

## 2022-03-02 IMAGING — DX DG CHEST 1V PORT
1 series · 1 of 1 positions shown · non-contrast
Comparison: Chest CT 09/23/2020

CLINICAL DATA: Chest pain.  Atrial fibrillation

EXAM:
PORTABLE CHEST 1 VIEW

[chest ap]
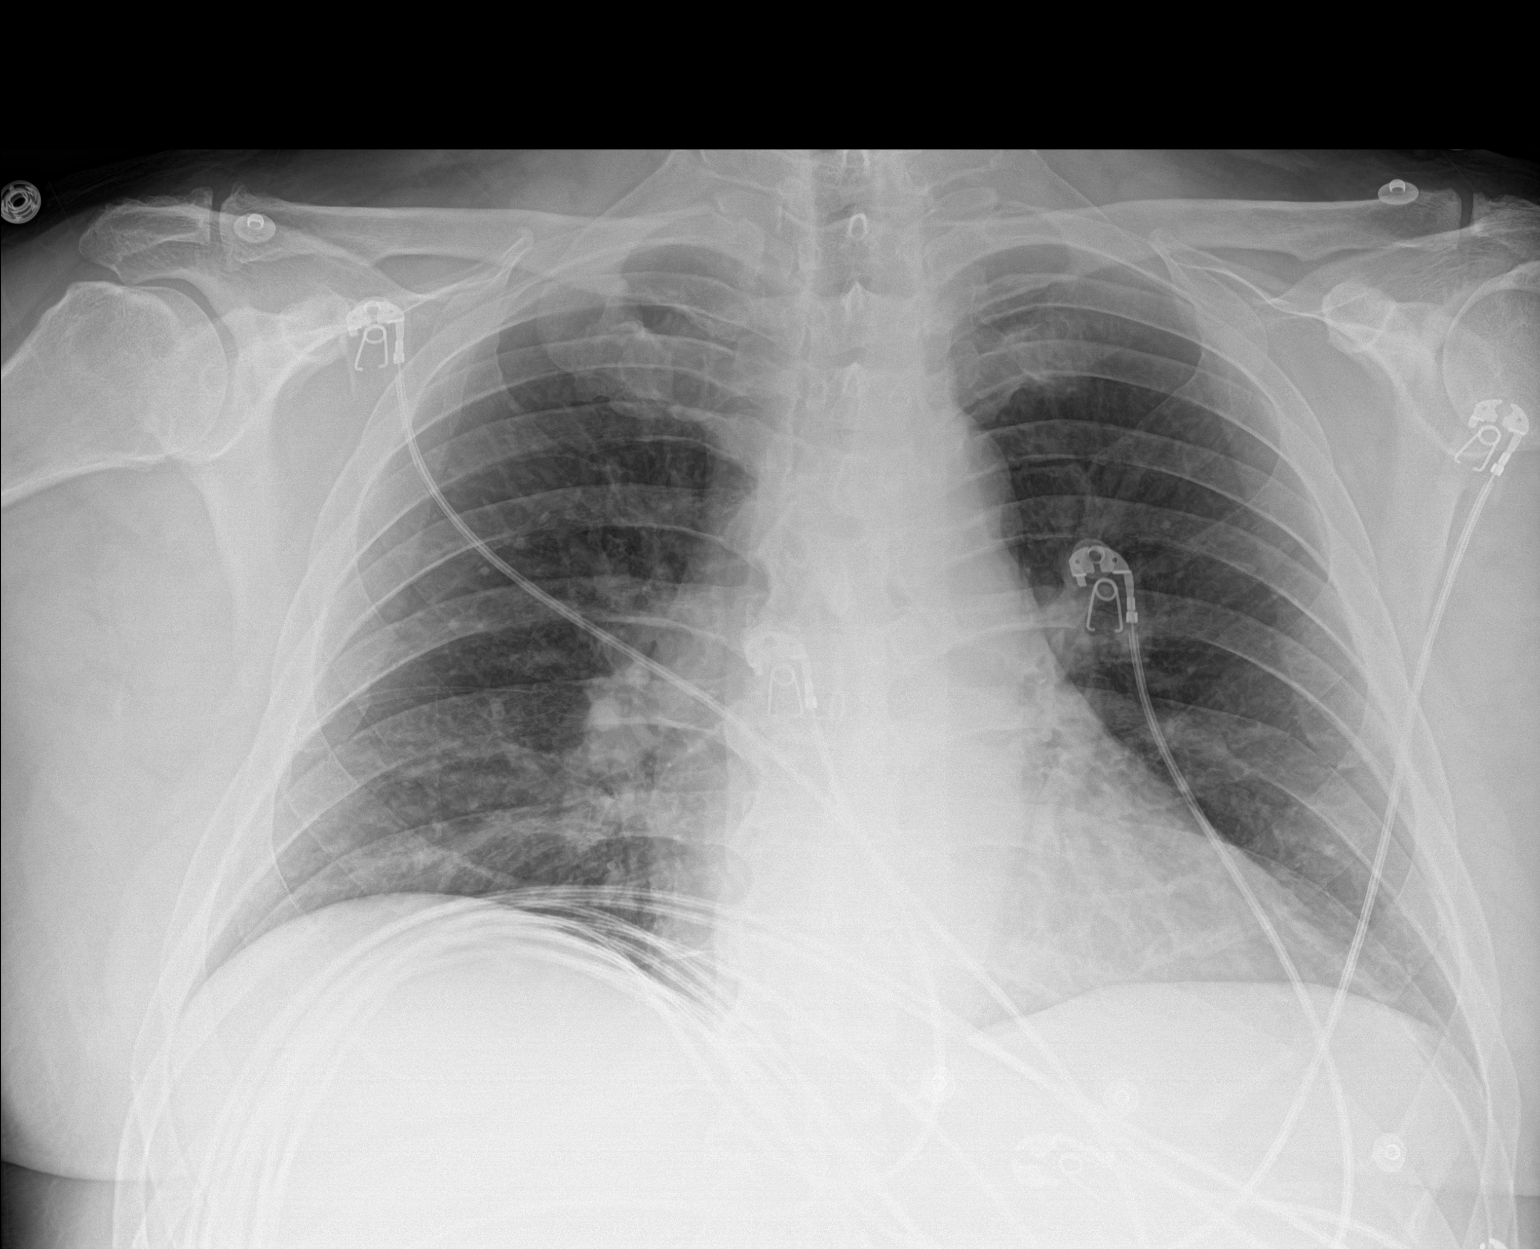

[1 of 1 positions shown; findings below may reference images not displayed]

FINDINGS: Low volume chest with interstitial crowding, unchanged. Generous
heart size accentuated by mediastinal fat on prior study. There was
also a pericardial effusion or thickening on prior. Artifact from
EKG leads. There is no edema, consolidation, effusion, or
pneumothorax.
IMPRESSION: No acute finding when compared to 0604.

## 2022-03-11 ENCOUNTER — Encounter: Payer: Self-pay | Admitting: Student

## 2022-03-11 ENCOUNTER — Ambulatory Visit: Payer: Federal, State, Local not specified - PPO | Admitting: Student

## 2022-03-11 VITALS — BP 140/87 | HR 84 | Temp 98.0°F | Resp 17 | Ht 70.0 in | Wt 239.0 lb

## 2022-03-11 DIAGNOSIS — I4892 Unspecified atrial flutter: Secondary | ICD-10-CM

## 2022-03-11 DIAGNOSIS — I10 Essential (primary) hypertension: Secondary | ICD-10-CM

## 2022-03-11 NOTE — Progress Notes (Signed)
? ?Primary Physician/Referring:  Heywood Bene, PA-C ? ?Patient ID: Carl [name], male    DOB: [birth year], 66 y.o.   MRN: [id number] ? ?Chief Complaint  ?Patient presents with  ? Atrial Flutter  ? Hypertension  ? HLD  ?  3 MONTH  ? ?HPI:   ? ?Carl [name]  is a 66 y.o. male with past medical history of hypertension, hyperlipidemia, tobacco use disorder, who presented to the emergency department 08/27/2020 in typical atrial flutter with RVR.  He underwent successful direct-current cardioversion.  Patient was then admitted to the hospital again 09/19/2020-09/26/2020 with pneumonia, pulmonary embolism, atrial flutter, and systolic heart failure.  ? ?Patient was last seen in the office 05/16/2021 advised to follow-up in 6 months.  However He was admitted 12/05/2021 - 12/06/2021 with atrial flutter with RVR.  Patient continued Eliquis, therefore resumed anticoagulation.  Patient was started on IV diltiazem and amiodarone, he subsequently converted to normal sinus rhythm and was discharged with p.o. amiodarone and diltiazem.   ? ?Patient presents for 73-monthfollow-up.  At last office visit no changes were made.  Since last office visit he has been evaluated by Dr. LQuentin Orewho recommends atrial flutter ablation, patient is scheduled for this procedure next month. ? ?Overall patient is feeling well, continues to tolerate anticoagulation without bleeding diathesis.  Denies palpitations, syncope, near syncope, chest pain, shortness of breath.  ? ?Past Medical History:  ?Diagnosis Date  ? Hypercholesteremia   ? Primary hypertension   ? [place]  ? [place] 08/[place] with PE  ? ?Past Surgical History:  ?Procedure Laterality Date  ? IR THORACENTESIS ASP PLEURAL SPACE W/IMG GUIDE  09/20/2020  ? LAPAROTOMY    ? LEFT HEART CATH AND CORONARY ANGIOGRAPHY N/A 09/25/2020  ? Procedure: LEFT HEART CATH AND CORONARY ANGIOGRAPHY;  Surgeon: PNigel Mormon MD;  [facility];  Service: Cardiovascular;  Laterality: N/A;  ? ?Family History  ?Problem Relation Age of Onset  ? CAD Mother 544 ? Heart attack Father   ? Cancer Sister   ?     lung  ? Heart disease Brother   ? Heart failure Brother   ? Heart attack Brother   ?  ?Social History  ? ?Tobacco Use  ? Smoking status: Some Days  ?  Types: Cigars  ? Smokeless tobacco: Never  ? Tobacco comments:  ?  Once a week  ?Substance Use Topics  ? Alcohol use: Yes  ?  Alcohol/week: 1.0 standard drink  ?  Types: 1 Glasses of wine per week  ?  Comment: Occasional  ? ?Marital Status: Widowed  ? ?ROS  ?Review of Systems  ?Cardiovascular:  Negative for chest pain, claudication, leg swelling, near-syncope, orthopnea, palpitations, paroxysmal nocturnal dyspnea and syncope.  ?Respiratory:  Negative for shortness of breath.   ?Gastrointestinal:  Negative for melena.  ?Neurological:  Negative for dizziness.  ? ?Objective  ?Blood pressure 140/87, pulse 84, temperature 98 ?F (36.7 ?C), temperature source Temporal, resp. rate 17, height _0  (1.778 m), weight 239 lb (108.4 kg), SpO2 97 %.  ? ?  03/11/2022  ?  9:05 AM 03/11/2022  ?  9:01 AM 02/19/2022  ?  2:56 PM  ?Vitals with BMI  ?Height  _1  _2   ?Weight  239 lbs 240 lbs 3 oz  ?BMI  34.29 34.47  ?Systolic [id number] ?Diastolic 87 79 80  ?Pulse 84 86 77  ?  ? Physical Exam ?  Vitals reviewed.  ?Cardiovascular:  ?   Rate and Rhythm: Normal rate and regular rhythm.  ?   Pulses: Intact distal pulses.  ?   Heart sounds: S1 normal and S2 normal. No murmur heard. ?  No gallop.  ?Pulmonary:  ?   Effort: Pulmonary effort is normal. No respiratory distress.  ?   Breath sounds: Normal breath sounds. No wheezing, rhonchi or rales.  ?Musculoskeletal:  ?   Right lower leg: Edema (trace) present.  ?   Left lower leg: Edema (trace) present.  ?Neurological:  ?   Mental Status: He is alert.  ? ?Laboratory examination:  ? ?Recent Labs  ?  12/05/21 ?8299 12/05/21 ?1447  ?NA 139 138  ?K 5.2* 3.8  ?CL 105 105   ?CO2 26 25  ?GLUCOSE 109* 156*  ?BUN 11 9  ?CREATININE 0.82 0.83  ?CALCIUM 9.0 9.0  ?GFRNONAA >60 >60  ? ?CrCl cannot be calculated (Patient's most recent lab result is older than the maximum 21 days allowed.).  ? ?  Latest Ref Rng & Units 12/05/2021  ?  2:47 PM 12/05/2021  ?  5:48 AM 09/26/2020  ?  5:36 AM  ?CMP  ?Glucose 70 - 99 mg/dL 156   109   89    ?BUN 8 - 23 mg/dL _0 ?Creatinine 0.61 - 1.24 mg/dL 0.83   0.82   0.88    ?Sodium 135 - 145 mmol/L 138   139   137    ?Potassium 3.5 - 5.1 mmol/L 3.8   5.2   3.9    ?Chloride 98 - 111 mmol/L 105   105   99    ?CO2 22 - 32 mmol/L 25   26   33    ?Calcium 8.9 - 10.3 mg/dL 9.0   9.0   8.7    ?Total Protein 6.5 - 8.1 g/dL  6.0     ?Total Bilirubin 0.3 - 1.2 mg/dL  1.6     ?Alkaline Phos 38 - 126 U/L  59     ?AST 15 - 41 U/L  33     ?ALT 0 - 44 U/L  15     ? ? ?  Latest Ref Rng & Units 12/06/2021  ?  1:34 AM 12/05/2021  ?  5:48 AM 09/26/2020  ?  5:36 AM  ?CBC  ?WBC 4.0 - 10.5 K/uL 7.8   8.2   11.1    ?Hemoglobin 13.0 - 17.0 g/dL 14.1   15.2   12.8    ?Hematocrit 39.0 - 52.0 % 41.4   44.5   40.5    ?Platelets 150 - 400 K/uL 211   244   349    ? ? ?Lipid Panel ?No results for input(s): CHOL, TRIG, LDLCALC, VLDL, HDL, CHOLHDL, LDLDIRECT in the last 8760 hours. ? ?HEMOGLOBIN A1C ?Lab Results  ?Component Value Date  ? HGBA1C 5.9 (H) 12/05/2021  ? MPG 123 12/05/2021  ? ?TSH ?Recent Labs  ?  12/05/21 ?2226  ?TSH 4.008  ? ?External labs:  ?03/22/2021: ?Hgb 14.6, HCT 42.4, MCV 95.0, platelet 191 ?Sodium 139, potassium 4.3, BUN 16, creatinine 0.73, ALT 16, AST 18, alk phos 67, GFR >90 ?LDL 89, total cholesterol 177, triglycerides 123, HDL 66, ? ?02/17/2020 ?LDL 145, total cholesterol 229, triglycerides 136, HDL 62, non-HDL 167 ? ?Allergies  ?No Known Allergies  ? ?Medications Prior to Visit:  ? ?Outpatient Medications Prior to Visit  ?Medication Sig  Dispense Refill  ? allopurinol (ZYLOPRIM) 100 MG tablet Take 100 mg by mouth in the morning.     ? amiodarone (PACERONE) 200 MG  tablet Take 1 tablet (200 mg total) by mouth daily. 90 tablet 3  ? apixaban (ELIQUIS) 5 MG TABS tablet Take 1 tablet (5 mg total) by mouth 2 (two) times daily. 60 tablet 2  ? aspirin EC 81 MG EC tablet Take 1 tablet (81 mg total) by mouth daily. Swallow whole. 30 tablet 11  ? atorvastatin (LIPITOR) 10 MG tablet Take 10 mg by mouth daily.    ? diltiazem (CARDIZEM CD) 120 MG 24 hr capsule Take 2 capsules (240 mg total) by mouth daily. 60 capsule 2  ? Lactobacillus Rhamnosus, GG, (RA PROBIOTIC DIGESTIVE CARE) CAPS Take 1 capsule by mouth in the morning.    ? Multiple Vitamins-Minerals (DAILY MULTIVITAMIN PO) Take 1 tablet by mouth daily.    ? spironolactone (ALDACTONE) 25 MG tablet TAKE 1 TABLET (25 MG TOTAL) BY MOUTH DAILY. 90 tablet 3  ? tadalafil (CIALIS) 20 MG tablet Take 20 mg by mouth daily as needed for erectile dysfunction.     ? ?No facility-administered medications prior to visit.  ? ?Final Medications at End of Visit   ? ?Current Meds  ?Medication Sig  ? allopurinol (ZYLOPRIM) 100 MG tablet Take 100 mg by mouth in the morning.   ? amiodarone (PACERONE) 200 MG tablet Take 1 tablet (200 mg total) by mouth daily.  ? apixaban (ELIQUIS) 5 MG TABS tablet Take 1 tablet (5 mg total) by mouth 2 (two) times daily.  ? aspirin EC 81 MG EC tablet Take 1 tablet (81 mg total) by mouth daily. Swallow whole.  ? atorvastatin (LIPITOR) 10 MG tablet Take 10 mg by mouth daily.  ? diltiazem (CARDIZEM CD) 120 MG 24 hr capsule Take 2 capsules (240 mg total) by mouth daily.  ? Lactobacillus Rhamnosus, GG, (RA PROBIOTIC DIGESTIVE CARE) CAPS Take 1 capsule by mouth in the morning.  ? Multiple Vitamins-Minerals (DAILY MULTIVITAMIN PO) Take 1 tablet by mouth daily.  ? spironolactone (ALDACTONE) 25 MG tablet TAKE 1 TABLET (25 MG TOTAL) BY MOUTH DAILY.  ? tadalafil (CIALIS) 20 MG tablet Take 20 mg by mouth daily as needed for erectile dysfunction.   ? ?Radiology:  ? ?No results found.  ? ?Chest x-ray 09/22/2020: ?1. Marked reduction in  size of the left pleural effusion status post thoracentesis. No pneumothorax. ?2. Borderline enlargement of the cardiopericardial silhouette. ? ?CT chest 09/23/2020: ?1. Moderate size pericardial effusion has dev

## 2022-03-12 ENCOUNTER — Other Ambulatory Visit: Payer: Self-pay | Admitting: Student

## 2022-03-12 ENCOUNTER — Other Ambulatory Visit: Payer: Self-pay | Admitting: Internal Medicine

## 2022-03-12 DIAGNOSIS — I5022 Chronic systolic (congestive) heart failure: Secondary | ICD-10-CM

## 2022-03-12 DIAGNOSIS — I480 Paroxysmal atrial fibrillation: Secondary | ICD-10-CM

## 2022-03-13 NOTE — Telephone Encounter (Signed)
Not a Clinic patient.  Has not scheduled any appointments in the Clinics. ?

## 2022-03-27 ENCOUNTER — Telehealth: Payer: Self-pay

## 2022-03-27 NOTE — Telephone Encounter (Signed)
Pt called stating that pharmacy  advised that we refused all medications. Please review and call pt today plz. //ah

## 2022-03-28 ENCOUNTER — Other Ambulatory Visit: Payer: Self-pay

## 2022-03-28 DIAGNOSIS — I10 Essential (primary) hypertension: Secondary | ICD-10-CM

## 2022-03-28 DIAGNOSIS — I5022 Chronic systolic (congestive) heart failure: Secondary | ICD-10-CM

## 2022-03-28 MED ORDER — SPIRONOLACTONE 25 MG PO TABS: 25.0000 mg | ORAL_TABLET | Freq: Every day | ORAL | 3 refills | Status: DC

## 2022-03-28 MED ORDER — AMIODARONE HCL 200 MG PO TABS: 200.0000 mg | ORAL_TABLET | Freq: Every day | ORAL | 3 refills | Status: DC

## 2022-03-28 NOTE — Telephone Encounter (Signed)
Okay to refill pts amiodarone?

## 2022-03-28 NOTE — Telephone Encounter (Signed)
Yes please do so 

## 2022-03-28 NOTE — Telephone Encounter (Signed)
Refills have been sent to pts pharmacy.

## 2022-03-31 ENCOUNTER — Other Ambulatory Visit: Payer: Self-pay

## 2022-03-31 ENCOUNTER — Other Ambulatory Visit: Payer: Federal, State, Local not specified - PPO

## 2022-03-31 DIAGNOSIS — I4892 Unspecified atrial flutter: Secondary | ICD-10-CM

## 2022-03-31 DIAGNOSIS — I10 Essential (primary) hypertension: Secondary | ICD-10-CM

## 2022-03-31 DIAGNOSIS — I5021 Acute systolic (congestive) heart failure: Secondary | ICD-10-CM

## 2022-03-31 LAB — CBC WITH DIFFERENTIAL/PLATELET
Basophils Absolute: 0 10*3/uL (ref 0.0–0.2)
Basos: 0 %
EOS (ABSOLUTE): 0 10*3/uL (ref 0.0–0.4)
Eos: 0 %
Hematocrit: 43.7 % (ref 37.5–51.0)
Hemoglobin: 15.1 g/dL (ref 13.0–17.7)
Lymphocytes Absolute: 1.1 10*3/uL (ref 0.7–3.1)
Lymphs: 13 %
MCH: 32.7 pg (ref 26.6–33.0)
MCHC: 34.6 g/dL (ref 31.5–35.7)
MCV: 95 fL (ref 79–97)
Monocytes Absolute: 0.8 10*3/uL (ref 0.1–0.9)
Monocytes: 10 %
Neutrophils Absolute: 6.4 10*3/uL (ref 1.4–7.0)
Neutrophils: 77 %
Platelets: 219 10*3/uL (ref 150–450)
RBC: 4.62 x10E6/uL (ref 4.14–5.80)
RDW: 13.4 % (ref 11.6–15.4)
WBC: 8.3 10*3/uL (ref 3.4–10.8)

## 2022-03-31 LAB — BASIC METABOLIC PANEL
BUN/Creatinine Ratio: 22 (ref 10–24)
BUN: 17 mg/dL (ref 8–27)
CO2: 25 mmol/L (ref 20–29)
Calcium: 9.6 mg/dL (ref 8.6–10.2)
Chloride: 102 mmol/L (ref 96–106)
Creatinine, Ser: 0.77 mg/dL (ref 0.76–1.27)
Glucose: 134 mg/dL — ABNORMAL HIGH (ref 70–99)
Potassium: 4.3 mmol/L (ref 3.5–5.2)
Sodium: 136 mmol/L (ref 134–144)
eGFR: 99 mL/min/{1.73_m2} (ref >59)

## 2022-03-31 MED ORDER — DILTIAZEM HCL ER COATED BEADS 120 MG PO CP24: 240.0000 mg | ORAL_CAPSULE | Freq: Every day | ORAL | 2 refills | Status: DC

## 2022-03-31 MED ORDER — APIXABAN 5 MG PO TABS: 5.0000 mg | ORAL_TABLET | Freq: Two times a day (BID) | ORAL | 2 refills | Status: DC

## 2022-04-17 ENCOUNTER — Telehealth: Payer: Self-pay | Admitting: *Deleted

## 2022-04-17 NOTE — Telephone Encounter (Signed)
Left message to call back and will send a mychart message as well.

## 2022-04-18 NOTE — Pre-Procedure Instructions (Signed)
Attempted to call patient regarding procedure instructions for procedure on Monday.  Left voice mail on cell # and home #  on the following items: Arrival time 0530 Nothing to eat or drink after midnight No meds AM of procedure Responsible person to drive you home and stay with you for 24 hrs  Have you missed any doses of anti-coagulant Eliquis- take doses Friday, Saturday and Sunday.  Do not take a dose on Monday morning  Also attempted to call daughter, left voice mail for her as well

## 2022-04-21 ENCOUNTER — Ambulatory Visit (HOSPITAL_COMMUNITY): Payer: Federal, State, Local not specified - PPO | Admitting: Certified Registered Nurse Anesthetist

## 2022-04-21 ENCOUNTER — Encounter (HOSPITAL_COMMUNITY)
Admission: RE | Disposition: A | Discharge status: Home / Self Care | Payer: Self-pay | Source: Home / Self Care | Attending: Cardiology

## 2022-04-21 ENCOUNTER — Other Ambulatory Visit: Payer: Self-pay

## 2022-04-21 ENCOUNTER — Encounter (HOSPITAL_COMMUNITY): Payer: Self-pay | Admitting: Cardiology

## 2022-04-21 ENCOUNTER — Ambulatory Visit (HOSPITAL_COMMUNITY)
Admission: RE | Admit: 2022-04-21 | Discharge: 2022-04-21 | Disposition: A | Discharge status: Home / Self Care | Payer: Federal, State, Local not specified - PPO | Attending: Cardiology | Admitting: Cardiology

## 2022-04-21 DIAGNOSIS — Z7901 Long term (current) use of anticoagulants: Secondary | ICD-10-CM | POA: Diagnosis not present

## 2022-04-21 DIAGNOSIS — I483 Typical atrial flutter: Secondary | ICD-10-CM

## 2022-04-21 DIAGNOSIS — I110 Hypertensive heart disease with heart failure: Secondary | ICD-10-CM | POA: Diagnosis not present

## 2022-04-21 DIAGNOSIS — F17290 Nicotine dependence, other tobacco product, uncomplicated: Secondary | ICD-10-CM | POA: Insufficient documentation

## 2022-04-21 DIAGNOSIS — E785 Hyperlipidemia, unspecified: Secondary | ICD-10-CM | POA: Insufficient documentation

## 2022-04-21 DIAGNOSIS — I5021 Acute systolic (congestive) heart failure: Secondary | ICD-10-CM | POA: Diagnosis not present

## 2022-04-21 HISTORY — PX: A-FLUTTER ABLATION: EP1230

## 2022-04-21 SURGERY — A-FLUTTER ABLATION
Anesthesia: General

## 2022-04-21 MED ORDER — SODIUM CHLORIDE 0.9% FLUSH: 3.0000 mL | Freq: Two times a day (BID) | INTRAVENOUS | Status: DC

## 2022-04-21 MED ORDER — SUGAMMADEX SODIUM 200 MG/2ML IV SOLN
INTRAVENOUS | Status: DC | PRN
  Administered 2022-04-21 (×2): 200 mg via INTRAVENOUS

## 2022-04-21 MED ORDER — ISOPROTERENOL HCL 0.2 MG/ML IJ SOLN
INTRAVENOUS | Status: DC | PRN
  Administered 2022-04-21: 4 ug/min via INTRAVENOUS

## 2022-04-21 MED ORDER — SODIUM CHLORIDE 0.9% FLUSH: 3.0000 mL | INTRAVENOUS | Status: DC | PRN

## 2022-04-21 MED ORDER — DEXAMETHASONE SODIUM PHOSPHATE 10 MG/ML IJ SOLN
INTRAMUSCULAR | Status: DC | PRN
  Administered 2022-04-21: 10 mg via INTRAVENOUS

## 2022-04-21 MED ORDER — MIDAZOLAM HCL 2 MG/2ML IJ SOLN
INTRAMUSCULAR | Status: DC | PRN
  Administered 2022-04-21: 2 mg via INTRAVENOUS

## 2022-04-21 MED ORDER — APIXABAN 5 MG PO TABS
5.0000 mg | ORAL_TABLET | Freq: Two times a day (BID) | ORAL | Status: DC
  Administered 2022-04-21: 5 mg via ORAL
  Filled 2022-04-21 (×2): qty 1

## 2022-04-21 MED ORDER — ONDANSETRON HCL 4 MG/2ML IJ SOLN: 4.0000 mg | Freq: Four times a day (QID) | INTRAMUSCULAR | Status: DC | PRN

## 2022-04-21 MED ORDER — HEPARIN SODIUM (PORCINE) 1000 UNIT/ML IJ SOLN
INTRAMUSCULAR | Status: AC
  Filled 2022-04-21: qty 10

## 2022-04-21 MED ORDER — HEPARIN SODIUM (PORCINE) 1000 UNIT/ML IJ SOLN
INTRAMUSCULAR | Status: DC | PRN
  Administered 2022-04-21: 1000 [IU] via INTRAVENOUS

## 2022-04-21 MED ORDER — PROPOFOL 10 MG/ML IV BOLUS
INTRAVENOUS | Status: DC | PRN
  Administered 2022-04-21: 160 mg via INTRAVENOUS

## 2022-04-21 MED ORDER — ONDANSETRON HCL 4 MG/2ML IJ SOLN
INTRAMUSCULAR | Status: DC | PRN
  Administered 2022-04-21: 4 mg via INTRAVENOUS

## 2022-04-21 MED ORDER — SODIUM CHLORIDE 0.9 % IV SOLN: INTRAVENOUS | Status: DC

## 2022-04-21 MED ORDER — ACETAMINOPHEN 325 MG PO TABS: 650.0000 mg | ORAL_TABLET | ORAL | Status: DC | PRN

## 2022-04-21 MED ORDER — SODIUM CHLORIDE 0.9 % IV SOLN: 250.0000 mL | INTRAVENOUS | Status: DC | PRN

## 2022-04-21 MED ORDER — HEPARIN (PORCINE) IN NACL 1000-0.9 UT/500ML-% IV SOLN
INTRAVENOUS | Status: AC
  Filled 2022-04-21: qty 500

## 2022-04-21 MED ORDER — FENTANYL CITRATE (PF) 250 MCG/5ML IJ SOLN
INTRAMUSCULAR | Status: DC | PRN
  Administered 2022-04-21: 100 ug via INTRAVENOUS

## 2022-04-21 MED ORDER — PHENYLEPHRINE HCL (PRESSORS) 10 MG/ML IV SOLN
INTRAVENOUS | Status: DC | PRN
  Administered 2022-04-21: 80 ug via INTRAVENOUS

## 2022-04-21 MED ORDER — ROCURONIUM BROMIDE 10 MG/ML (PF) SYRINGE
PREFILLED_SYRINGE | INTRAVENOUS | Status: DC | PRN
  Administered 2022-04-21: 80 mg via INTRAVENOUS
  Administered 2022-04-21: 20 mg via INTRAVENOUS

## 2022-04-21 MED ORDER — ISOPROTERENOL HCL 0.2 MG/ML IJ SOLN
INTRAMUSCULAR | Status: AC
  Filled 2022-04-21: qty 5

## 2022-04-21 MED ORDER — LIDOCAINE 2% (20 MG/ML) 5 ML SYRINGE
INTRAMUSCULAR | Status: DC | PRN
  Administered 2022-04-21: 60 mg via INTRAVENOUS

## 2022-04-21 MED ORDER — HEPARIN (PORCINE) IN NACL 1000-0.9 UT/500ML-% IV SOLN
INTRAVENOUS | Status: DC | PRN
  Administered 2022-04-21 (×3): 500 mL

## 2022-04-21 SURGICAL SUPPLY — 14 items
CATH SMTCH THERMOCOOL SF DF (CATHETERS) ×1 IMPLANT
CATH SOUNDSTAR ECO 8FR (CATHETERS) ×1 IMPLANT
CATH WEB BI DIR CSDF CRV REPRO (CATHETERS) ×1 IMPLANT
CLOSURE PERCLOSE PROSTYLE (VASCULAR PRODUCTS) ×3 IMPLANT
MAT PREVALON FULL STRYKER (MISCELLANEOUS) ×1 IMPLANT
PACK EP LATEX FREE (CUSTOM PROCEDURE TRAY) ×2
PACK EP LF (CUSTOM PROCEDURE TRAY) ×1 IMPLANT
PAD DEFIB RADIO PHYSIO CONN (PAD) ×2 IMPLANT
PATCH CARTO3 (PAD) ×1 IMPLANT
SHEATH CARTO VIZIGO SM CVD (SHEATH) ×1 IMPLANT
SHEATH PINNACLE 8F 10CM (SHEATH) ×2 IMPLANT
SHEATH PINNACLE 9F 10CM (SHEATH) ×1 IMPLANT
SHEATH PROBE COVER 6X72 (BAG) ×1 IMPLANT
TUBING SMART ABLATE COOLFLOW (TUBING) ×1 IMPLANT

## 2022-04-21 NOTE — Transfer of Care (Signed)
Immediate Anesthesia Transfer of Care Note  Patient: Carl [name]  Procedure(s) Performed: A-FLUTTER ABLATION  Patient Location: PACU and Cath Lab  Anesthesia Type:General  Level of Consciousness: patient cooperative and responds to stimulation  Airway & Oxygen Therapy: Patient Spontanous Breathing and Patient connected to nasal cannula oxygen  Post-op Assessment: Report given to RN and Post -op Vital signs reviewed and stable  Post vital signs: Reviewed and stable  Last Vitals:  Vitals Value Taken Time  BP 156/93 04/21/22 1148  Temp    Pulse 78 04/21/22 1150  Resp 18 04/21/22 1150  SpO2 99 % 04/21/22 1150  Vitals shown include unvalidated device data.  Last Pain:  Vitals:   04/21/22 0820  TempSrc: Oral         Complications: No notable events documented.

## 2022-04-21 NOTE — Anesthesia Procedure Notes (Signed)
Procedure Name: Intubation Date/Time: 04/21/2022 10:35 AM  Performed by: Betha Loa, CRNAPre-anesthesia Checklist: Patient identified, Emergency Drugs available, Suction available and Patient being monitored Patient Re-evaluated:Patient Re-evaluated prior to induction Oxygen Delivery Method: Circle System Utilized Preoxygenation: Pre-oxygenation with 100% oxygen Induction Type: IV induction Ventilation: Two handed mask ventilation required and Oral airway inserted - appropriate to patient size Laryngoscope Size: Mac and 4 Grade View: Grade I Tube type: Oral Tube size: 7.5 mm Number of attempts: 1 Airway Equipment and Method: Stylet and Oral airway Placement Confirmation: ETT inserted through vocal cords under direct vision, positive ETCO2 and breath sounds checked- equal and bilateral Secured at: 23 cm Tube secured with: Tape Dental Injury: Teeth and Oropharynx as per pre-operative assessment

## 2022-04-21 NOTE — Discharge Instructions (Signed)

## 2022-04-21 NOTE — Anesthesia Preprocedure Evaluation (Addendum)
Anesthesia Evaluation  Patient identified by MRN, date of birth, ID band Patient awake    Reviewed: Allergy & Precautions, NPO status , Patient's Chart, lab work & pertinent test results, reviewed documented beta blocker date and time   Airway Mallampati: II  TM Distance: >3 FB Neck ROM: Full    Dental  (+) Dental Advisory Given, Teeth Intact   Pulmonary shortness of breath and with exertion, pneumonia, resolved, Current Smoker and Patient abstained from smoking., PE   Pulmonary exam normal breath sounds clear to auscultation       Cardiovascular hypertension, Pt. on medications and Pt. on home beta blockers +CHF  Normal cardiovascular exam+ dysrhythmias Atrial Fibrillation  Rhythm:Regular Rate:Normal  EKG 5/23 NSR, LAFB, Anterior infarct  Echo 12/05/21 1. Left ventricular ejection fraction, by estimation, is 60 to 65%. The left ventricle has normal function. The left ventricle has no regional wall motion abnormalities. There is moderate concentric left ventricular hypertrophy. Left ventricular diastolic parameters are indeterminate.  2. Right ventricular systolic function is normal. The right ventricular size is normal. Tricuspid regurgitation signal is inadequate for assessing PA pressure.  3. The mitral valve is normal in structure. No evidence of mitral valve regurgitation. No evidence of mitral stenosis.  4. The aortic valve is normal in structure. Aortic valve regurgitation is not visualized. No aortic stenosis is present.  5. Aortic dilatation noted. There is mild dilatation of the aortic root, measuring 38 mm. There is mild dilatation of the ascending aorta, measuring 40 mm.  6. The inferior vena cava is normal in size with greater than 50% respiratory variability, suggesting right atrial pressure of 3 mmHg.    Neuro/Psych negative neurological ROS  negative psych ROS   GI/Hepatic negative GI ROS, (+)     substance  abuse  alcohol use,   Endo/Other  Hyperlipidemia Gout Obesity  Renal/GU negative Renal ROS  negative genitourinary   Musculoskeletal negative musculoskeletal ROS (+)   Abdominal   Peds  Hematology Eliquis therapy- last dose 6/11 am   Anesthesia Other Findings   Reproductive/Obstetrics ED                           Anesthesia Physical Anesthesia Plan  ASA: 3  Anesthesia Plan: General   Post-op Pain Management: Minimal or no pain anticipated   Induction: Intravenous  PONV Risk Score and Plan: 2 and Treatment may vary due to age or medical condition and Ondansetron  Airway Management Planned: Oral ETT  Additional Equipment: None  Intra-op Plan:   Post-operative Plan: Extubation in OR  Informed Consent: I have reviewed the patients History and Physical, chart, labs and discussed the procedure including the risks, benefits and alternatives for the proposed anesthesia with the patient or authorized representative who has indicated his/her understanding and acceptance.     Dental advisory given  Plan Discussed with: CRNA and Anesthesiologist  Anesthesia Plan Comments:         Anesthesia Quick Evaluation

## 2022-04-21 NOTE — Progress Notes (Signed)
Client up and walked and tolerated well; bilat groins stable, no bleeding or hematoma; called Dr Quentin Ore for medication reconciliation and no answer

## 2022-04-21 NOTE — H&P (Signed)
Electrophysiology Office Note:     Date:  04/21/2022    ID:  Carl [name], DOB 1955/12/09, MRN [id number]   PCP:  Heywood Bene, PA-C           [facility] Cardiologist:  None  [facility] Electrophysiologist:  Vickie Epley, MD    Referring MD: Alethia Berthold, PA*    Chief Complaint: Atrial flutter   History of Present Illness:     Carl [name] is a 66 y.o. male who presents for an evaluation of atrial flutter at the request of Lawerance Cruel, PA-C. Their medical history includes hypertension, hyperlipidemia, tobacco use and multiple hospitalizations for atrial flutter with rapid ventricular rates.  He has been hospitalized in October 2021, November 2021, January 2023 all for atrial flutter with rapid ventricular rates.  He is on anticoagulation for stroke prophylaxis.  He presents today to discuss treatment options for his atrial arrhythmias.   Today, he appears well. Since starting amiodarone, he denies any recurrent arrhythmias.   Lately he always seems fatigued and it is difficult for him to feel motivated. He states this is not typical for him, as he is usually involved with constant maintenance and yard work at home.   He is doing well keeping up with some exercise and trying to follow a healthier diet.   Currently on 5 mg Eliquis twice daily.   He denies any palpitations, chest pain, shortness of breath, or peripheral edema. No lightheadedness, headaches, syncope, orthopnea, or PND.   Presents for CTI ablation.     Objective      Past Medical History:  Diagnosis Date   Hypercholesteremia     Primary hypertension     Pulmonary embolism ([place]) 08/27/2020   Typical atrial flutter ([place]) 08/27/2020 with PE           Past Surgical History:  Procedure Laterality Date   IR THORACENTESIS ASP PLEURAL SPACE W/IMG GUIDE   09/20/2020   LAPAROTOMY       LEFT HEART CATH AND CORONARY ANGIOGRAPHY N/A 09/25/2020    Procedure: LEFT HEART CATH AND CORONARY  ANGIOGRAPHY;  Surgeon: Nigel Mormon, MD;  Location: [facility];  Service: Cardiovascular;  Laterality: N/A;      Current Medications: Active Medications      Current Meds  Medication Sig   allopurinol (ZYLOPRIM) 100 MG tablet Take 100 mg by mouth in the morning.    amiodarone (PACERONE) 200 MG tablet Take 1 tablet (200 mg total) by mouth daily.   apixaban (ELIQUIS) 5 MG TABS tablet Take 1 tablet (5 mg total) by mouth 2 (two) times daily.   aspirin EC 81 MG EC tablet Take 1 tablet (81 mg total) by mouth daily. Swallow whole.   atorvastatin (LIPITOR) 10 MG tablet Take 10 mg by mouth daily.   diltiazem (CARDIZEM CD) 120 MG 24 hr capsule Take 2 capsules (240 mg total) by mouth daily.   Lactobacillus Rhamnosus, GG, (RA PROBIOTIC DIGESTIVE CARE) CAPS Take 1 capsule by mouth in the morning.   Multiple Vitamins-Minerals (DAILY MULTIVITAMIN PO) Take 1 tablet by mouth daily.   spironolactone (ALDACTONE) 25 MG tablet TAKE 1 TABLET (25 MG TOTAL) BY MOUTH DAILY.   tadalafil (CIALIS) 20 MG tablet Take 20 mg by mouth daily as needed for erectile dysfunction.         Allergies:   Patient has no known allergies.    Social History         Socioeconomic History  Marital status: Widowed      Spouse name: Not on file   Number of children: 2   Years of education: Not on file   Highest education level: Not on file  Occupational History   Occupation: retired  Tobacco Use   Smoking status: Some Days      Types: Cigars   Smokeless tobacco: Never   Tobacco comments:      Once a week  Vaping Use   Vaping Use: Never used  Substance and Sexual Activity   Alcohol use: Yes      Alcohol/week: 1.0 standard drink      Types: 1 Glasses of wine per week      Comment: Occasional   Drug use: Never   Sexual activity: Not on file  Other Topics Concern   Not on file  Social History Narrative   Not on file    Social Determinants of Health    Financial Resource Strain: Not on file  Food  Insecurity: Not on file  Transportation Needs: Not on file  Physical Activity: Not on file  Stress: Not on file  Social Connections: Not on file      Family History: The patient's family history includes CAD (age of onset: 34) in his mother; Cancer in his sister; Heart attack in his brother and father; Heart disease in his brother; Heart failure in his brother.   ROS:   Please see the history of present illness.    (+) Fatigue/Malaise All other systems reviewed and are negative.   EKGs/Labs/Other Studies Reviewed:     The following studies were reviewed today:   January 2023 echo Left ventricular function normal, 60% Moderate concentric LVH Normal RV function No MR No AS   December 05, 2021, CTA Chest IMPRESSION: 1. Chronic-appearing RIGHT upper lobe apical segmental pulmonary embolus. No additional evidence to suggest acute segmental or larger pulmonary emboli. 2. Main pulmonary artery dilation, likely reflecting underlying pulmonary arterial hypertension. 3. 4.3 cm ascending thoracic aorta. Recommend annual imaging followup by CTA or MRA. This recommendation follows 2010 ACCF/AHA/AATS/ACR/ASA/SCA/SCAI/SIR/STS/SVM Guidelines for the Diagnosis and Management of Patients with Thoracic Aortic Disease. Circulation. 2010; [id number]. Aortic aneurysm NOS (ICD10-I71.9) 4. Coronary atherosclerosis, with greatest burden within the LAD. 5.  Additional incidental, chronic and senescent findings as above.  November 2021 left heart cath No significant coronary artery disease       August 27, 2020 EKG shows typical appearing atrial flutter with a ventricular rate of 170 bpm, right bundle branch block, left anterior fascicular block, 2-1 AV conduction   August 28, 2020 EKG shows sinus rhythm, left anterior fascicular block   September 17, 2020 EKG shows typical atrial flutter with 2 1 AV conduction   December 05, 2021 EKG shows typical appearing atrial flutter with 2 1 AV  conduction   EKG:  EKG is personally reviewed. 02/19/2022: sinus rhythm   Recent Labs: 12/05/2021: ALT 15; BUN 9; Creatinine, Ser 0.83; Magnesium 2.0; Potassium 3.8; Sodium 138; TSH 4.008 12/06/2021: Hemoglobin 14.1; Platelets 211    Recent Lipid Panel Labs (Brief)  No results found for: CHOL, TRIG, HDL, CHOLHDL, VLDL, LDLCALC, LDLDIRECT     Physical Exam:     VS:  BP (!) 145/93   Pulse 85   Ht 5\' 10"  (1.778 m)   Wt 240 lb 3.2 oz (109 kg)   SpO2 95%   BMI 34.47 kg/m         Wt Readings from Last 3 Encounters:  02/19/22  240 lb 3.2 oz (109 kg)  12/12/21 241 lb (109.3 kg)  12/11/21 232 lb (105.2 kg)      GEN: Well nourished, well developed in no acute distress HEENT: Normal NECK: No JVD; No carotid bruits LYMPHATICS: No lymphadenopathy CARDIAC: RRR, no murmurs, rubs, gallops RESPIRATORY:  Clear to auscultation without rales, wheezing or rhonchi  ABDOMEN: Soft, non-tender, non-distended MUSCULOSKELETAL:  No edema; No deformity  SKIN: Warm and dry NEUROLOGIC:  Alert and oriented x 3 PSYCHIATRIC:  Normal affect          Assessment ASSESSMENT:     1. Paroxysmal atrial flutter ([place])   2. Acute systolic CHF (congestive heart failure) ([place])   3. Primary hypertension     PLAN:     In order of problems listed above:   #Atrial flutter, typical Discussed treatment options with the patient including continuing amiodarone versus pursuing typical flutter ablation.  He would like to proceed with catheter ablation in an effort to avoid long-term exposure to amiodarone.  I discussed the atrial flutter ablation in detail with the patient including the risk, recovery and likelihood of success.  I discussed the chances of developing atrial fibrillation after a successful atrial flutter ablation.  He would like to proceed with scheduling.   Risk, benefits, and alternatives to EP study and radiofrequency ablation for atrial flutter were also discussed in detail today. These risks  include but are not limited to stroke, bleeding, vascular damage, tamponade, perforation, damage to the esophagus, lungs, and other structures, pulmonary vein stenosis, worsening renal function, and death. The patient understands these risk and wishes to proceed.  We will therefore proceed with catheter ablation at the next available time.  Carto, ICE, anesthesia are requested for the procedure.        Presents today for CTI ablation.   Signed, Hilton Cork. Quentin Ore, MD, [facility], [facility] 04/21/2022 Electrophysiology [facility]

## 2022-04-21 NOTE — Anesthesia Postprocedure Evaluation (Signed)
Anesthesia Post Note  Patient: Carl [name]  Procedure(s) Performed: A-FLUTTER ABLATION     Patient location during evaluation: PACU Anesthesia Type: General Level of consciousness: awake and alert and oriented Pain management: pain level controlled Vital Signs Assessment: post-procedure vital signs reviewed and stable Respiratory status: spontaneous breathing, nonlabored ventilation and respiratory function stable Cardiovascular status: blood pressure returned to baseline and stable Postop Assessment: no apparent nausea or vomiting Anesthetic complications: no   No notable events documented.  Last Vitals:  Vitals:   04/21/22 1220 04/21/22 1223  BP: 140/80 (!) 143/81  Pulse: 73 74  Resp: 16 16  Temp: 36.8 C   SpO2: 94% 96%    Last Pain:  Vitals:   04/21/22 0820  TempSrc: Oral                 Ivan Lacher A.

## 2022-04-21 NOTE — Telephone Encounter (Signed)
Talked to the patient this morning. Said he saw the message on mychart and he is at the hospital already this morning.

## 2022-04-22 ENCOUNTER — Encounter (HOSPITAL_COMMUNITY): Payer: Self-pay | Admitting: Cardiology

## 2022-05-06 ENCOUNTER — Telehealth: Payer: Self-pay | Admitting: Cardiology

## 2022-05-06 NOTE — Telephone Encounter (Signed)
Patient states ever since his ablation he has been very fatigued. He states he has trouble getting out of bed and doing routine daily activities. He states he is always tired and very weak, which is not like him at all--his friends are even noticing. Patient also mentioned occasional nausea.

## 2022-05-07 NOTE — Telephone Encounter (Signed)
  AFIB CLINIC INFORMATION: Your appointment is scheduled on: 6/29 at 3:30 pm . Please arrive 15 minutes early for check-in. The AFib Clinic is located in the Heart and Vascular Specialty Clinics at [facility]. Parking instructions/directions: Government social research officer C ([address]). When you pull in to Entrance C, there is an underground parking garage to your right. The code to enter the garage is 1004. Take the elevators to the first floor. Follow the signs to the Heart and Vascular Specialty Clinics. You will see registration at the end of the hallway.  Phone number: [phone]

## 2022-05-07 NOTE — Telephone Encounter (Signed)
Left message for patient to call back.   Per MD Lalla Brothers. He doesn't feel as if his symptoms are related to post ablation. He would like the patient to be seen if the AF Clinic for evaluation.

## 2022-05-07 NOTE — Telephone Encounter (Signed)
Patient called to talk with Dr. Lalla Brothers or nurse in regards to some advice from Dr. Lalla Brothers

## 2022-05-08 ENCOUNTER — Ambulatory Visit (HOSPITAL_COMMUNITY)
Admission: RE | Admit: 2022-05-08 | Discharge: 2022-05-08 | Disposition: A | Discharge status: Home / Self Care | Payer: Federal, State, Local not specified - PPO | Source: Ambulatory Visit | Attending: Physician Assistant | Admitting: Physician Assistant

## 2022-05-08 ENCOUNTER — Encounter (HOSPITAL_COMMUNITY): Payer: Self-pay | Admitting: Physician Assistant

## 2022-05-08 VITALS — BP 148/88 | HR 88 | Ht 70.0 in | Wt 235.2 lb

## 2022-05-08 DIAGNOSIS — F17290 Nicotine dependence, other tobacco product, uncomplicated: Secondary | ICD-10-CM | POA: Diagnosis not present

## 2022-05-08 DIAGNOSIS — R5383 Other fatigue: Secondary | ICD-10-CM | POA: Insufficient documentation

## 2022-05-08 DIAGNOSIS — I5022 Chronic systolic (congestive) heart failure: Secondary | ICD-10-CM | POA: Insufficient documentation

## 2022-05-08 DIAGNOSIS — Z79899 Other long term (current) drug therapy: Secondary | ICD-10-CM | POA: Diagnosis not present

## 2022-05-08 DIAGNOSIS — Z8249 Family history of ischemic heart disease and other diseases of the circulatory system: Secondary | ICD-10-CM | POA: Diagnosis not present

## 2022-05-08 DIAGNOSIS — I483 Typical atrial flutter: Secondary | ICD-10-CM | POA: Diagnosis present

## 2022-05-08 DIAGNOSIS — Z7901 Long term (current) use of anticoagulants: Secondary | ICD-10-CM | POA: Insufficient documentation

## 2022-05-08 DIAGNOSIS — I110 Hypertensive heart disease with heart failure: Secondary | ICD-10-CM | POA: Insufficient documentation

## 2022-05-08 DIAGNOSIS — D6869 Other thrombophilia: Secondary | ICD-10-CM | POA: Insufficient documentation

## 2022-05-08 DIAGNOSIS — E669 Obesity, unspecified: Secondary | ICD-10-CM | POA: Diagnosis not present

## 2022-05-08 DIAGNOSIS — Z6833 Body mass index (BMI) 33.0-33.9, adult: Secondary | ICD-10-CM | POA: Diagnosis not present

## 2022-05-08 LAB — CBC
HCT: 43.6 % (ref 39.0–52.0)
Hemoglobin: 15.2 g/dL (ref 13.0–17.0)
MCH: 34 pg (ref 26.0–34.0)
MCHC: 34.9 g/dL (ref 30.0–36.0)
MCV: 97.5 fL (ref 80.0–100.0)
Platelets: 178 10*3/uL (ref 150–400)
RBC: 4.47 MIL/uL (ref 4.22–5.81)
RDW: 12.8 % (ref 11.5–15.5)
WBC: 7.5 10*3/uL (ref 4.0–10.5)
nRBC: 0 % (ref 0.0–0.2)

## 2022-05-08 LAB — TSH: TSH: 1.547 u[IU]/mL (ref 0.350–4.500)

## 2022-05-08 NOTE — Progress Notes (Signed)
Primary Care Physician: Bernita Buffy Primary Cardiologist: [facility]  Primary Electrophysiologist: Dr Lalla Brothers Referring Physician: Dr Hulan Fess is a 66 y.o. male with a history of HTN, HLD, tobacco abuse, atrial flutter, CHF who presents for follow up in the [facility]. He was hospitalized in October 2021, November 2021, January 2023 all for atrial flutter with rapid ventricular rates. Patient is on Eliquis for a CHADS2VASC score of 3. He underwent typical atrial flutter ablation with Dr Lalla Brothers on 04/21/22. Patient denies any heart racing since the procedure but he remains fatigued. The fatigue predates his ablation. He denies any pain or SOB, no symptoms of fluid retention. He just feels tired after doing any activity.   Today, he denies symptoms of palpitations, chest pain, shortness of breath, orthopnea, PND, lower extremity edema, dizziness, presyncope, syncope, snoring, daytime somnolence, bleeding, or neurologic sequela. The patient is tolerating medications without difficulties and is otherwise without complaint today.    Atrial Fibrillation Risk Factors:  he does not have symptoms or diagnosis of sleep apnea. he does not have a history of rheumatic fever. he does have a history of alcohol use.   he has a BMI of Body mass index is 33.75 kg/m.Marland Kitchen Filed Weights   05/08/22 1451  Weight: 106.7 kg    Family History  Problem Relation Age of Onset   CAD Mother 31   Heart attack Father    Cancer Sister        lung   Heart disease Brother    Heart failure Brother    Heart attack Brother      Atrial Fibrillation Management history:  Previous antiarrhythmic drugs: amiodarone Previous cardioversions: 2021 Previous ablations: 04/21/22 CHADS2VASC score: 3 Anticoagulation history: Eliquis   Past Medical History:  Diagnosis Date   Hypercholesteremia    Primary hypertension    Pulmonary embolism (HCC)  08/27/2020   Typical atrial flutter (HCC) 08/27/2020 with PE   Past Surgical History:  Procedure Laterality Date   A-FLUTTER ABLATION N/A 04/21/2022   Procedure: A-FLUTTER ABLATION;  Surgeon: Lanier Prude, MD;  Location: [facility] INVASIVE CV LAB;  Service: Cardiovascular;  Laterality: N/A;   IR THORACENTESIS ASP PLEURAL SPACE W/IMG GUIDE  09/20/2020   LAPAROTOMY     LEFT HEART CATH AND CORONARY ANGIOGRAPHY N/A 09/25/2020   Procedure: LEFT HEART CATH AND CORONARY ANGIOGRAPHY;  Surgeon: Elder Negus, MD;  Location: [facility] INVASIVE CV LAB;  Service: Cardiovascular;  Laterality: N/A;    Current Outpatient Medications  Medication Sig Dispense Refill   allopurinol (ZYLOPRIM) 100 MG tablet Take 100 mg by mouth in the morning.      amiodarone (PACERONE) 200 MG tablet Take 1 tablet (200 mg total) by mouth daily. 90 tablet 3   apixaban (ELIQUIS) 5 MG TABS tablet Take 1 tablet (5 mg total) by mouth 2 (two) times daily. 60 tablet 2   aspirin EC 81 MG EC tablet Take 1 tablet (81 mg total) by mouth daily. Swallow whole. 30 tablet 11   atorvastatin (LIPITOR) 10 MG tablet Take 10 mg by mouth in the morning.     diltiazem (CARDIZEM CD) 120 MG 24 hr capsule Take 2 capsules (240 mg total) by mouth daily. 60 capsule 2   Lactobacillus Rhamnosus, GG, (RA PROBIOTIC DIGESTIVE CARE) CAPS Take 1 capsule by mouth in the morning.     Multiple Vitamin (MULTIVITAMIN WITH MINERALS) TABS tablet Take 1 tablet by mouth daily. Centrum Silver for Men  50+     spironolactone (ALDACTONE) 25 MG tablet Take 1 tablet (25 mg total) by mouth daily. 90 tablet 3   tadalafil (CIALIS) 20 MG tablet Take 20 mg by mouth daily as needed for erectile dysfunction.      No current facility-administered medications for this encounter.    No Known Allergies  Social History   Socioeconomic History   Marital status: Widowed    Spouse name: Not on file   Number of children: 2   Years of education: Not on file   Highest education  level: Not on file  Occupational History   Occupation: retired  Tobacco Use   Smoking status: Some Days    Types: Cigars   Smokeless tobacco: Never   Tobacco comments:    Once a week 05/08/22  Vaping Use   Vaping Use: Never used  Substance and Sexual Activity   Alcohol use: Yes    Alcohol/week: 6.0 - 8.0 standard drinks of alcohol    Types: 6 - 8 Glasses of wine per week    Comment: 3-4 glasses of wine twice a week 05/08/22   Drug use: Yes    Types: Marijuana    Comment: 1 edible nightly 05/08/22   Sexual activity: Not on file  Other Topics Concern   Not on file  Social History Narrative   Not on file   Social Determinants of Health   Financial Resource Strain: Not on file  Food Insecurity: Not on file  Transportation Needs: Not on file  Physical Activity: Not on file  Stress: Not on file  Social Connections: Not on file  Intimate Partner Violence: Not on file     ROS- All systems are reviewed and negative except as per the HPI above.  Physical Exam: Vitals:   05/08/22 1451  BP: (!) 148/88  Pulse: 88  Weight: 106.7 kg  Height: 5\' 10"  (1.778 m)    GEN- The patient is a well appearing obese male, alert and oriented x 3 today.   Head- normocephalic, atraumatic Eyes-  Sclera clear, conjunctiva pink Ears- hearing intact Oropharynx- clear Neck- supple  Lungs- Clear to ausculation bilaterally, normal work of breathing Heart- Regular rate and rhythm, no murmurs, rubs or gallops  GI- soft, NT, ND, + BS Extremities- no clubbing, cyanosis, or edema MS- no significant deformity or atrophy Skin- no rash or lesion Psych- euthymic mood, full affect Neuro- strength and sensation are intact  Wt Readings from Last 3 Encounters:  05/08/22 106.7 kg  04/21/22 105.2 kg  03/11/22 108.4 kg    EKG today demonstrates  SR, slow R wave prog Vent. rate 88 BPM PR interval 194 ms QRS duration 92 ms QT/QTcB 374/452 ms  Echo 12/05/21 demonstrated  1. Left ventricular  ejection fraction, by estimation, is 60 to 65%. The  left ventricle has normal function. The left ventricle has no regional  wall motion abnormalities. There is moderate concentric left ventricular  hypertrophy. Left ventricular diastolic parameters are indeterminate.   2. Right ventricular systolic function is normal. The right ventricular  size is normal. Tricuspid regurgitation signal is inadequate for assessing PA pressure.   3. The mitral valve is normal in structure. No evidence of mitral valve  regurgitation. No evidence of mitral stenosis.   4. The aortic valve is normal in structure. Aortic valve regurgitation is  not visualized. No aortic stenosis is present.   5. Aortic dilatation noted. There is mild dilatation of the aortic root,  measuring 38 mm. There  is mild dilatation of the ascending aorta,  measuring 40 mm.   6. The inferior vena cava is normal in size with greater than 50%  respiratory variability, suggesting right atrial pressure of 3 mmHg.   Epic records are reviewed at length today  CHA2DS2-VASc Score = 3  The patient's score is based upon: CHF History: 1 HTN History: 1 Diabetes History: 0 Stroke History: 0 Vascular Disease History: 0 Age Score: 1 Gender Score: 0       ASSESSMENT AND PLAN: 1. Typical atrial flutter The patient's CHA2DS2-VASc score is 3, indicating a 3.2% annual risk of stroke.   S/p flutter ablation 04/21/22 Patient remains in SR  Continue amiodarone 200 mg daily, hopefully this will be short term post ablation. Continue Eliquis 5 mg BID Continue diltiazem 120 mg BID  2. Secondary Hypercoagulable State (ICD10:  D68.69) The patient is at significant risk for stroke/thromboembolism based upon his CHA2DS2-VASc Score of 3.  Continue Apixaban (Eliquis).   3. Obesity Body mass index is 33.75 kg/m. Lifestyle modification was discussed at length including regular exercise and weight reduction.  4. Systolic dysfunction EF normalized  60-65% Appears euvolemic today.  5. Fatigue Predates his ablation, no symptoms of volume overload or infection. No SOB or CP. In SR.  Will check CBC/TSH today.   Follow up with Dr Quentin Ore as scheduled.    [facility] [address] [place], [place] [address] [phone] 05/08/2022 3:05 PM

## 2022-05-26 ENCOUNTER — Ambulatory Visit: Payer: Federal, State, Local not specified - PPO | Admitting: Cardiology

## 2022-05-31 ENCOUNTER — Other Ambulatory Visit: Payer: Self-pay | Admitting: Student

## 2022-07-03 NOTE — Progress Notes (Signed)
Electrophysiology Office Follow up Visit Note:    Date:  07/04/2022   ID:  Carl [name], DOB November 13, 1955, MRN [id number]  PCP:  Roger Kill, PA-C  [facility] Cardiologist:  None  [facility] Electrophysiologist:  Lanier Prude, MD    Interval History:    Carl [name] is a 66 y.o. male who presents for a follow up visit after an atrial flutter ablation April 21, 2022.  During the ablation, cavotricuspid isthmus dependent atrial flutter was successfully ablated.  Since the procedure he has done well without recurrence of arrhythmia.  He currently takes amiodarone, Eliquis, diltiazem.  He is felt much better since the ablation.  Has not had any episodes of arrhythmia.  Has been taking Eliquis for stroke prophylaxis.  He is interested in stopping medications if at all possible.       Past Medical History:  Diagnosis Date   Hypercholesteremia    Primary hypertension    Pulmonary embolism (HCC) 08/27/2020   Typical atrial flutter (HCC) 08/27/2020 with PE    Past Surgical History:  Procedure Laterality Date   A-FLUTTER ABLATION N/A 04/21/2022   Procedure: A-FLUTTER ABLATION;  Surgeon: Lanier Prude, MD;  Location: [facility] INVASIVE CV LAB;  Service: Cardiovascular;  Laterality: N/A;   IR THORACENTESIS ASP PLEURAL SPACE W/IMG GUIDE  09/20/2020   LAPAROTOMY     LEFT HEART CATH AND CORONARY ANGIOGRAPHY N/A 09/25/2020   Procedure: LEFT HEART CATH AND CORONARY ANGIOGRAPHY;  Surgeon: Elder Negus, MD;  Location: [facility] INVASIVE CV LAB;  Service: Cardiovascular;  Laterality: N/A;    Current Medications: Current Meds  Medication Sig   allopurinol (ZYLOPRIM) 100 MG tablet Take 100 mg by mouth in the morning.    apixaban (ELIQUIS) 5 MG TABS tablet Take 1 tablet (5 mg total) by mouth 2 (two) times daily.   aspirin EC 81 MG EC tablet Take 1 tablet (81 mg total) by mouth daily. Swallow whole.   atorvastatin (LIPITOR) 10 MG tablet Take 10 mg by mouth in the morning.    diltiazem (CARDIZEM CD) 120 MG 24 hr capsule Take 1 capsule (120 mg total) by mouth daily.   Lactobacillus Rhamnosus, GG, (RA PROBIOTIC DIGESTIVE CARE) CAPS Take 1 capsule by mouth in the morning.   Multiple Vitamin (MULTIVITAMIN WITH MINERALS) TABS tablet Take 1 tablet by mouth daily. Centrum Silver for Men 50+   spironolactone (ALDACTONE) 25 MG tablet Take 1 tablet (25 mg total) by mouth daily.   tadalafil (CIALIS) 20 MG tablet Take 20 mg by mouth daily as needed for erectile dysfunction.    [DISCONTINUED] amiodarone (PACERONE) 200 MG tablet Take 1 tablet (200 mg total) by mouth daily.   [DISCONTINUED] diltiazem (CARDIZEM CD) 120 MG 24 hr capsule TAKE 2 CAPSULES BY MOUTH DAILY.     Allergies:   Patient has no known allergies.   Social History   Socioeconomic History   Marital status: Widowed    Spouse name: Not on file   Number of children: 2   Years of education: Not on file   Highest education level: Not on file  Occupational History   Occupation: retired  Tobacco Use   Smoking status: Some Days    Types: Cigars   Smokeless tobacco: Never   Tobacco comments:    Once a week 05/08/22  Vaping Use   Vaping Use: Never used  Substance and Sexual Activity   Alcohol use: Yes    Alcohol/week: 6.0 - 8.0 standard drinks of alcohol  Types: 6 - 8 Glasses of wine per week    Comment: 3-4 glasses of wine twice a week 05/08/22   Drug use: Yes    Types: Marijuana    Comment: 1 edible nightly 05/08/22   Sexual activity: Not on file  Other Topics Concern   Not on file  Social History Narrative   Not on file   Social Determinants of Health   Financial Resource Strain: Not on file  Food Insecurity: Not on file  Transportation Needs: Not on file  Physical Activity: Not on file  Stress: Not on file  Social Connections: Not on file     Family History: The patient's family history includes CAD (age of onset: 77) in his mother; Cancer in his sister; Heart attack in his brother and  father; Heart disease in his brother; Heart failure in his brother.  ROS:   Please see the history of present illness.    All other systems reviewed and are negative.  EKGs/Labs/Other Studies Reviewed:    The following studies were reviewed today:  December 05, 2021 echo EF 60% RV function normal No more  May 08, 2022 EKG shows sinus rhythm  EKG:  The ekg ordered today demonstrates sinus rhythm  Recent Labs: 12/05/2021: ALT 15; Magnesium 2.0 03/31/2022: BUN 17; Creatinine, Ser 0.77; Potassium 4.3; Sodium 136 05/08/2022: Hemoglobin 15.2; Platelets 178; TSH 1.547  Recent Lipid Panel No results found for: "CHOL", "TRIG", "HDL", "CHOLHDL", "VLDL", "LDLCALC", "LDLDIRECT"  Physical Exam:    VS:  BP 134/78   Pulse 79   Ht 5\' 10"  (1.778 m)   Wt 235 lb 3.2 oz (106.7 kg)   SpO2 95%   BMI 33.75 kg/m     Wt Readings from Last 3 Encounters:  07/04/22 235 lb 3.2 oz (106.7 kg)  05/08/22 235 lb 3.2 oz (106.7 kg)  04/21/22 232 lb (105.2 kg)     GEN:  Well nourished, well developed in no acute distress.  Obese HEENT: Normal NECK: No JVD; No carotid bruits LYMPHATICS: No lymphadenopathy CARDIAC: RRR, no murmurs, rubs, gallops RESPIRATORY:  Clear to auscultation without rales, wheezing or rhonchi  ABDOMEN: Soft, non-tender, non-distended MUSCULOSKELETAL:  No edema; No deformity  SKIN: Warm and dry NEUROLOGIC:  Alert and oriented x 3 PSYCHIATRIC:  Normal affect        ASSESSMENT:    1. Typical atrial flutter ([place])   2. Primary hypertension    PLAN:    In order of problems listed above:   #Typical atrial flutter Doing well after his catheter ablation.  Has been maintaining sinus rhythm.  Recommend he stop amiodarone.  We discussed atrial fibrillation surveillance strategies during today's clinic appointment.  We discussed Apple Watch, Samsung watch and loop recorder implant. He will continue using his Fitbit and pulse ox to monitor his heart rhythm/rates.  If he  experiences any recurrence of his arrhythmia, he is to let us know immediately.  If he does not have any recurrence by the 13-month mark after his atrial flutter ablation, I think he can safely stop his Eliquis.  We discussed the pros and cons of this approach.  He understands that if he were to have a recurrence of atrial fibrillation, he would need to restart anticoagulation and see [place] in clinic to discuss management options.  Decrease diltiazem to 120 mg by mouth once daily from 240 mg by mouth.  #Chronic systolic heart failure Recovered ejection fraction while in normal rhythm.  Likely tachycardia mediated.  Rhythm control  indicated as above.  Continue current medications.  If he maintains rhythm and has no evidence of clinical heart failure, I think it is reasonable for him to discontinue spironolactone at his next follow-up with his primary cardiology team.  #Hypertension At goal.  Continue current medical therapy.  He is to check his blood pressures 1-2 times per week at home and record these values.  Follow-up 1 year or sooner as needed.  APP appointment okay.  Medication Adjustments/Labs and Tests Ordered: Current medicines are reviewed at length with the patient today.  Concerns regarding medicines are outlined above.  Orders Placed This Encounter  Procedures   EKG 12-Lead   Meds ordered this encounter  Medications   diltiazem (CARDIZEM CD) 120 MG 24 hr capsule    Sig: Take 1 capsule (120 mg total) by mouth daily.    Dispense:  90 capsule    Refill:  3     Signed, Steffanie Dunn, MD, [facility], [facility] 07/04/2022 3:05 PM    Electrophysiology [facility]

## 2022-07-04 ENCOUNTER — Encounter: Payer: Self-pay | Admitting: Cardiology

## 2022-07-04 ENCOUNTER — Ambulatory Visit: Payer: Federal, State, Local not specified - PPO | Admitting: Cardiology

## 2022-07-04 VITALS — BP 134/78 | HR 79 | Ht 70.0 in | Wt 235.2 lb

## 2022-07-04 DIAGNOSIS — I5022 Chronic systolic (congestive) heart failure: Secondary | ICD-10-CM

## 2022-07-04 DIAGNOSIS — I483 Typical atrial flutter: Secondary | ICD-10-CM | POA: Diagnosis not present

## 2022-07-04 DIAGNOSIS — I10 Essential (primary) hypertension: Secondary | ICD-10-CM | POA: Diagnosis not present

## 2022-07-04 MED ORDER — DILTIAZEM HCL ER COATED BEADS 120 MG PO CP24: 120.0000 mg | ORAL_CAPSULE | Freq: Every day | ORAL | 3 refills | Status: DC

## 2022-07-04 NOTE — Patient Instructions (Signed)
Medication Instructions:  Reduce your Cardizem to 120 mg daily Stop Amiodarone Stop Eliquis 3 months post ablation Sept 12  *If you need a refill on your cardiac medications before your next appointment, please call your pharmacy*   Lab Work: none If you have labs (blood work) drawn today and your tests are completely normal, you will receive your results only by: MyChart Message (if you have MyChart) OR A paper copy in the mail If you have any lab test that is abnormal or we need to change your treatment, we will call you to review the results.   Testing/Procedures: none   Follow-Up: At [facility], you and your health needs are our priority.  As part of our continuing mission to provide you with exceptional heart care, we have created designated Provider Care Teams.  These Care Teams include your primary Cardiologist (physician) and Advanced Practice Providers (APPs -  Physician Assistants and Nurse Practitioners) who all work together to provide you with the care you need, when you need it.  We recommend signing up for the patient portal called "MyChart".  Sign up information is provided on this After Visit Summary.  MyChart is used to connect with patients for Virtual Visits (Telemedicine).  Patients are able to view lab/test results, encounter notes, upcoming appointments, etc.  Non-urgent messages can be sent to your provider as well.   To learn more about what you can do with MyChart, go to [email or web address].    Your next appointment:   1 year(s)  The format for your next appointment:   In Person  Provider:   You will see one of the following Advanced Practice Providers on your designated Care Team:   Francis Dowse, [place] Casimiro Needle "Mardelle Matte" Lanna Poche, PA-C   :1} Other Instructions None   Important Information About Sugar

## 2022-07-15 ENCOUNTER — Encounter: Payer: Self-pay | Admitting: *Deleted

## 2022-07-15 ENCOUNTER — Other Ambulatory Visit: Payer: Self-pay | Admitting: *Deleted

## 2022-08-11 ENCOUNTER — Ambulatory Visit: Payer: Federal, State, Local not specified - PPO

## 2022-08-11 VITALS — BP 145/88 | HR 78 | Temp 98.0°F | Resp 16 | Ht 70.0 in | Wt 235.0 lb

## 2022-08-11 DIAGNOSIS — I483 Typical atrial flutter: Secondary | ICD-10-CM

## 2022-08-11 DIAGNOSIS — I10 Essential (primary) hypertension: Secondary | ICD-10-CM

## 2022-08-11 MED ORDER — VALSARTAN 160 MG PO TABS: 160.0000 mg | ORAL_TABLET | Freq: Every day | ORAL | 3 refills | Status: DC

## 2022-08-11 NOTE — Progress Notes (Signed)
Primary Physician/Referring:  Roger Kill, PA-C  Patient ID: Carl [name], male    DOB: August 16, 1956, 66 y.o.   MRN: [id number]  Chief Complaint  Patient presents with   Hypertension   Atrial Flutter   Follow-up    6 month   HPI:    Carl [name]  is a 66 y.o. male with past medical history of hypertension, hyperlipidemia, tobacco use disorder, who presented to the emergency department 08/27/2020 in typical atrial flutter with RVR.  He underwent successful direct-current cardioversion.  Patient was then admitted to the hospital again 09/19/2020-09/26/2020 with pneumonia, pulmonary embolism, atrial flutter, and systolic heart failure.   Patient presents for 62-month follow-up. Since last office visit he underwent a successful atrial flutter ablation with Dr. Lalla Brothers on 04/21/22.  Overall patient is feeling well. He is very active with yard work and hobbies. Denies palpitations, syncope, near syncope, chest pain, shortness of breath.  Past Medical History:  Diagnosis Date   Hypercholesteremia    Primary hypertension    Pulmonary embolism (HCC) 08/27/2020   Typical atrial flutter (HCC) 08/27/2020 with PE   Past Surgical History:  Procedure Laterality Date   A-FLUTTER ABLATION N/A 04/21/2022   Procedure: A-FLUTTER ABLATION;  Surgeon: Lanier Prude, MD;  Location: [facility] INVASIVE CV LAB;  Service: Cardiovascular;  Laterality: N/A;   IR THORACENTESIS ASP PLEURAL SPACE W/IMG GUIDE  09/20/2020   LAPAROTOMY     LEFT HEART CATH AND CORONARY ANGIOGRAPHY N/A 09/25/2020   Procedure: LEFT HEART CATH AND CORONARY ANGIOGRAPHY;  Surgeon: Elder Negus, MD;  Location: [facility] INVASIVE CV LAB;  Service: Cardiovascular;  Laterality: N/A;   Family History  Problem Relation Age of Onset   CAD Mother 61   Heart attack Father    Cancer Sister        lung   Heart disease Brother    Heart failure Brother    Heart attack Brother     Social History   Tobacco Use   Smoking status: Some  Days    Types: Cigars   Smokeless tobacco: Never   Tobacco comments:    Once a week 05/08/22  Substance Use Topics   Alcohol use: Yes    Alcohol/week: 6.0 - 8.0 standard drinks of alcohol    Types: 6 - 8 Glasses of wine per week    Comment: 3-4 glasses of wine twice a week 05/08/22   Marital Status: Widowed   ROS  Review of Systems  Cardiovascular:  Negative for chest pain, claudication, leg swelling, near-syncope, orthopnea, palpitations, paroxysmal nocturnal dyspnea and syncope.  Respiratory:  Negative for shortness of breath.   Gastrointestinal:  Negative for melena.  Neurological:  Negative for dizziness.   Objective  Blood pressure (!) 145/88, pulse 78, temperature 98 F (36.7 C), resp. rate 16, height 5\' 10"  (1.778 m), weight 235 lb (106.6 kg), SpO2 95 %.     08/11/2022    9:13 AM 07/04/2022   11:33 AM 05/08/2022    2:51 PM  Vitals with BMI  Height 5\' 10"  5\' 10"  5\' 10"   Weight 235 lbs 235 lbs 3 oz 235 lbs 3 oz  BMI 33.72 33.75 33.75  Systolic 145 134 05/10/2022  Diastolic 88 78 88  Pulse 78 79 88     Physical Exam Vitals reviewed.  Cardiovascular:     Rate and Rhythm: Normal rate and regular rhythm.     Pulses: Intact distal pulses.     Heart sounds: S1 normal and S2 normal.  No murmur heard.    No gallop.  Pulmonary:     Effort: Pulmonary effort is normal. No respiratory distress.     Breath sounds: Normal breath sounds. No wheezing, rhonchi or rales.  Musculoskeletal:     Right lower leg: No edema.     Left lower leg: No edema.  Neurological:     Mental Status: He is alert.    Laboratory examination:   Recent Labs    12/05/21 0548 12/05/21 1447 03/31/22 1438  NA 139 138 136  K 5.2* 3.8 4.3  CL 105 105 102  CO2 26 25 25   GLUCOSE 109* 156* 134*  BUN 11 9 17   CREATININE 0.82 0.83 0.77  CALCIUM 9.0 9.0 9.6  GFRNONAA >60 >60  --    CrCl cannot be calculated (Patient's most recent lab result is older than the maximum 21 days allowed.).     Latest Ref Rng  & Units 03/31/2022    2:38 PM 12/05/2021    2:47 PM 12/05/2021    5:48 AM  CMP  Glucose 70 - 99 mg/dL 12/07/2021  12/07/2021  161   BUN 8 - 27 mg/dL 17  9  11    Creatinine 0.76 - 1.27 mg/dL 096  045    Sodium 134 - 144 mmol/L 136  138  139   Potassium 3.5 - 5.2 mmol/L 4.3  3.8  5.2   Chloride 96 - 106 mmol/L 102  105  105   CO2 20 - 29 mmol/L 25  25  26    Calcium 8.6 - 10.2 mg/dL 9.6  9.0  9.0   Total Protein 6.5 - 8.1 g/dL   6.0   Total Bilirubin 0.3 - 1.2 mg/dL   1.6   Alkaline Phos 38 - 126 U/L   59   AST 15 - 41 U/L   33   ALT 0 - 44 U/L   15       Latest Ref Rng & Units 05/08/2022    2:43 PM 03/31/2022    2:38 PM 12/06/2021    1:34 AM  CBC  WBC 4.0 - 10.5 K/uL 7.5  8.3  7.8   Hemoglobin 13.0 - 17.0 g/dL  05/10/2022  04/02/2022   Hematocrit 39.0 - 52.0 % 43.6  43.7  41.4   Platelets 150 - 400 K/uL 178  219  211     Lipid Panel No results for input(s): "CHOL", "TRIG", "LDLCALC", "VLDL", "HDL", "CHOLHDL", "LDLDIRECT" in the last 8760 hours.  HEMOGLOBIN A1C Lab Results  Component Value Date   HGBA1C 5.9 (H) 12/05/2021   MPG 123 12/05/2021   TSH Recent Labs    12/05/21 2226 05/08/22 1443  TSH 4.008 1.547   External labs:   Allergies  No Known Allergies   Medications Prior to Visit:   Outpatient Medications Prior to Visit  Medication Sig Dispense Refill   allopurinol (ZYLOPRIM) 100 MG tablet Take 100 mg by mouth in the morning.      aspirin EC 81 MG EC tablet Take 1 tablet (81 mg total) by mouth daily. Swallow whole. 30 tablet 11   atorvastatin (LIPITOR) 10 MG tablet Take 10 mg by mouth in the morning.     Lactobacillus Rhamnosus, GG, (RA PROBIOTIC DIGESTIVE CARE) CAPS Take 1 capsule by mouth in the morning.     Multiple Vitamin (MULTIVITAMIN WITH MINERALS) TABS tablet Take 1 tablet by mouth daily. Centrum Silver for Men 50+     tadalafil (CIALIS) 20 MG tablet  Take 20 mg by mouth daily as needed for erectile dysfunction.      diltiazem (CARDIZEM CD) 120 MG 24 hr capsule Take  1 capsule (120 mg total) by mouth daily. 90 capsule 3   spironolactone (ALDACTONE) 25 MG tablet Take 1 tablet (25 mg total) by mouth daily. 90 tablet 3   No facility-administered medications prior to visit.   Final Medications at End of Visit    Current Meds  Medication Sig   allopurinol (ZYLOPRIM) 100 MG tablet Take 100 mg by mouth in the morning.    aspirin EC 81 MG EC tablet Take 1 tablet (81 mg total) by mouth daily. Swallow whole.   atorvastatin (LIPITOR) 10 MG tablet Take 10 mg by mouth in the morning.   Lactobacillus Rhamnosus, GG, (RA PROBIOTIC DIGESTIVE CARE) CAPS Take 1 capsule by mouth in the morning.   Multiple Vitamin (MULTIVITAMIN WITH MINERALS) TABS tablet Take 1 tablet by mouth daily. Centrum Silver for Men 50+   tadalafil (CIALIS) 20 MG tablet Take 20 mg by mouth daily as needed for erectile dysfunction.    valsartan (DIOVAN) 160 MG tablet Take 1 tablet (160 mg total) by mouth daily.   [DISCONTINUED] diltiazem (CARDIZEM CD) 120 MG 24 hr capsule Take 1 capsule (120 mg total) by mouth daily.   [DISCONTINUED] spironolactone (ALDACTONE) 25 MG tablet Take 1 tablet (25 mg total) by mouth daily.   Radiology:   No results found.   Chest x-ray 09/22/2020: 1. Marked reduction in size of the left pleural effusion status post thoracentesis. No pneumothorax. 2. Borderline enlargement of the cardiopericardial silhouette.  CT chest 09/23/2020: 1. Moderate size pericardial effusion has developed since the previous study. 2. Trace amount of right pleural fluid layering dependently with minimal dependent atelectasis. Small left effusion layering dependently with mild dependent atelectasis. 3. Small pulmonary emboli seen previously cannot be visualized in the absence of contrast. 4. Aortic atherosclerosis.  Coronary artery calcification.  Aortic Atherosclerosis (ICD10-I70.0).  Cardiac Studies:   Coronary angiography 09/25/2020 LM: Normal LAD: Type 1 LAD that does not reach apex           Medial calcification without any luminal stenosis         Large Diag 1 that reaches apex and also gives off septal perforators, making it a dual LAD system          No other diagonals present LCx: Normal Ramus: Normal RCA: Large, gives of several septal perforators, reaches apex         Minimal luminal irregularities No significant coronary artery disease  Echocardiogram 12/05/2021:  LVEF 60 to 65%, moderate LVH, no significant valvular heart disease, aortic root 38 mm ascending aorta 40 mm.  EP Study/ Aflutter Ablation 04/21/22: CONCLUSIONS:   1.  Typical appearing atrial flutter    2. Successful radiofrequency ablation of atrial flutter along the cavotricuspid isthmus with complete bidirectional isthmus block achieved.   3. No inducible arrhythmias following ablation.   4. No early apparent complications.    EKG:  EKG 08/11/22: Normal sinus rhythm at a rate of 75 bpm.  Left axis. T wave inversion in lateral leads, consider possible ischemia without underlying injury pattern.  Compared to EKG on 03/11/2022 no significant change.  Assessment     ICD-10-CM   1. Typical atrial flutter (HCC)  I48.3 EKG 12-Lead    2. Primary hypertension  I10 valsartan (DIOVAN) 160 MG tablet       Medications Discontinued During This Encounter  Medication Reason  diltiazem (CARDIZEM CD) 120 MG 24 hr capsule Change in therapy   spironolactone (ALDACTONE) 25 MG tablet Change in therapy    Meds ordered this encounter  Medications   valsartan (DIOVAN) 160 MG tablet    Sig: Take 1 tablet (160 mg total) by mouth daily.    Dispense:  90 tablet    Refill:  3    Order Specific Question:   Supervising Provider    Answer:   Yates Decamp [[id number]]    This patients CHA2DS2-VASc Score 3 (HTN, vasc, age) and yearly risk of stroke 3.2%.   Recommendations:   Typical atrial flutter (HCC) No recurrence of atrial flutter status post atrial flutter ablation on 04/21/2022.  He is on aspirin 81 mg daily without  bleeding diathesis.  Primary hypertension Does monitor his blood pressure at home.  Home readings are 130s over 80s.  He did have questions about diltiazem and spironolactone and wanted to come off these medications. Will discontinue and start single agent for blood pressure control. We will start valsartan 160 mg daily. Advised him to check blood pressure at least 2-3 times weekly keep a log of home readings.  Goal BP less than 140/80.    He does have an appointment scheduled with his PCP in February and will have labs completed this visit.  Overall, he is doing well from a cardiovascular standpoint.  We will see him back in 6 months for blood pressure follow-up.  Follow-up in 6 months, sooner if needed.    Nori Riis, NP 08/11/2022, 9:48 AM Office: [phone]

## 2022-09-11 ENCOUNTER — Ambulatory Visit: Payer: Federal, State, Local not specified - PPO | Admitting: Student

## 2023-02-09 ENCOUNTER — Ambulatory Visit: Payer: Federal, State, Local not specified - PPO | Admitting: Internal Medicine

## 2023-02-09 ENCOUNTER — Encounter: Payer: Self-pay | Admitting: Internal Medicine

## 2023-02-09 VITALS — BP 147/81 | HR 90 | Resp 16 | Ht 70.0 in | Wt 236.0 lb

## 2023-02-09 DIAGNOSIS — I10 Essential (primary) hypertension: Secondary | ICD-10-CM

## 2023-02-09 DIAGNOSIS — E7800 Pure hypercholesterolemia, unspecified: Secondary | ICD-10-CM

## 2023-02-09 DIAGNOSIS — I483 Typical atrial flutter: Secondary | ICD-10-CM

## 2023-02-09 MED ORDER — METOPROLOL SUCCINATE ER 25 MG PO TB24: 25.0000 mg | ORAL_TABLET | Freq: Every day | ORAL | 3 refills | Status: DC

## 2023-02-09 NOTE — Progress Notes (Unsigned)
Primary Physician/Referring:  Heywood Bene, PA-C  Patient ID: Carl [name], male    DOB: 20-Apr-1956, 67 y.o.   MRN: [id number]  Chief Complaint  Patient presents with   Atrial Flutter   Follow-up   Hypertension   HPI:    Carl [name]  is a 67 y.o. male with past medical history of hypertension, hyperlipidemia, tobacco use disorder, who presented to the emergency department 08/27/2020 in typical atrial flutter with RVR.  He underwent successful direct-current cardioversion.  Patient was then admitted to the hospital again 09/19/2020-09/26/2020 with pneumonia, pulmonary embolism, atrial flutter, and systolic heart failure.   Patient presents for 21-month follow-up. Since last office visit he underwent a successful atrial flutter ablation with Dr. Quentin Ore on 04/21/22.  Overall patient is feeling well. He is very active with yard work and hobbies. Denies palpitations, syncope, near syncope, chest pain, shortness of breath.  Past Medical History:  Diagnosis Date   Hypercholesteremia    Primary hypertension    Pulmonary embolism 08/27/2020   Typical atrial flutter 08/27/2020 with PE   Past Surgical History:  Procedure Laterality Date   A-FLUTTER ABLATION N/A 04/21/2022   Procedure: A-FLUTTER ABLATION;  Surgeon: Vickie Epley, MD;  Location: [facility];  Service: Cardiovascular;  Laterality: N/A;   IR THORACENTESIS ASP PLEURAL SPACE W/IMG GUIDE  09/20/2020   LAPAROTOMY     LEFT HEART CATH AND CORONARY ANGIOGRAPHY N/A 09/25/2020   Procedure: LEFT HEART CATH AND CORONARY ANGIOGRAPHY;  Surgeon: Nigel Mormon, MD;  Location: [facility];  Service: Cardiovascular;  Laterality: N/A;   Family History  Problem Relation Age of Onset   CAD Mother 68   Heart attack Father    Cancer Sister        lung   Heart disease Brother    Heart failure Brother    Heart attack Brother     Social History   Tobacco Use   Smoking status: Some Days    Types: Cigars    Smokeless tobacco: Never   Tobacco comments:    Once a week 05/08/22  Substance Use Topics   Alcohol use: Yes    Alcohol/week: 6.0 - 8.0 standard drinks of alcohol    Types: 6 - 8 Glasses of wine per week    Comment: 3-4 glasses of wine twice a week 05/08/22   Marital Status: Widowed   ROS  Review of Systems  Cardiovascular:  Negative for chest pain, claudication, leg swelling, near-syncope, orthopnea, palpitations, paroxysmal nocturnal dyspnea and syncope.  Respiratory:  Negative for shortness of breath.   Gastrointestinal:  Negative for melena.  Neurological:  Negative for dizziness.   Objective  Blood pressure (!) 147/81, pulse 90, resp. rate 16, height 5\' 10"  (1.778 m), weight 236 lb (107 kg), SpO2 96 %.     02/09/2023   10:56 AM 08/11/2022    9:13 AM 07/04/2022   11:33 AM  Vitals with BMI  Height 5\' 10"  5\' 10"  5\' 10"   Weight 236 lbs 235 lbs 235 lbs 3 oz  BMI 33.86 [id number]  Systolic Q000111Q Q000111Q Q000111Q  Diastolic 81 88 78  Pulse 90 78 79     Physical Exam Vitals reviewed.  Cardiovascular:     Rate and Rhythm: Normal rate and regular rhythm.     Pulses: Intact distal pulses.     Heart sounds: S1 normal and S2 normal. No murmur heard.    No gallop.  Pulmonary:     Effort:  Pulmonary effort is normal. No respiratory distress.     Breath sounds: Normal breath sounds. No wheezing, rhonchi or rales.  Musculoskeletal:     Right lower leg: No edema.     Left lower leg: No edema.  Neurological:     Mental Status: He is alert.    Laboratory examination:   Recent Labs    03/31/22 1438  NA 136  K 4.3  CL 102  CO2 25  GLUCOSE 134*  BUN 17  CREATININE 0.77  CALCIUM 9.6   CrCl cannot be calculated (Patient's most recent lab result is older than the maximum 21 days allowed.).     Latest Ref Rng & Units 03/31/2022    2:38 PM 12/05/2021    2:47 PM 12/05/2021    5:48 AM  CMP  Glucose 70 - 99 mg/dL 134  156  109   BUN 8 - 27 mg/dL 17  9  11    Creatinine 0.76 - 1.27  mg/dL 0.77  0.83  0.82   Sodium 134 - 144 mmol/L 136  138  139   Potassium 3.5 - 5.2 mmol/L 4.3  3.8  5.2   Chloride 96 - 106 mmol/L 102  105  105   CO2 20 - 29 mmol/L 25  25  26    Calcium 8.6 - 10.2 mg/dL 9.6  9.0  9.0   Total Protein 6.5 - 8.1 g/dL   6.0   Total Bilirubin 0.3 - 1.2 mg/dL   1.6   Alkaline Phos 38 - 126 U/L   59   AST 15 - 41 U/L   33   ALT 0 - 44 U/L   15       Latest Ref Rng & Units 05/08/2022    2:43 PM 03/31/2022    2:38 PM 12/06/2021    1:34 AM  CBC  WBC 4.0 - 10.5 K/uL 7.5  8.3  7.8   Hemoglobin 13.0 - 17.0 g/dL 15.2  15.1  14.1   Hematocrit 39.0 - 52.0 % 43.6  43.7  41.4   Platelets 150 - 400 K/uL 178  219  211     Lipid Panel No results for input(s): "CHOL", "TRIG", "[place]", "VLDL", "HDL", "CHOLHDL", "LDLDIRECT" in the last 8760 hours.  HEMOGLOBIN A1C Lab Results  Component Value Date   HGBA1C 5.9 (H) 12/05/2021   MPG 123 12/05/2021   TSH Recent Labs    05/08/22 1443  TSH 1.547   External labs:   Allergies  No Known Allergies   Medications Prior to Visit:   Outpatient Medications Prior to Visit  Medication Sig Dispense Refill   allopurinol (ZYLOPRIM) 100 MG tablet Take 100 mg by mouth in the morning.      aspirin EC 81 MG EC tablet Take 1 tablet (81 mg total) by mouth daily. Swallow whole. 30 tablet 11   atorvastatin (LIPITOR) 10 MG tablet Take 10 mg by mouth in the morning.     Lactobacillus Rhamnosus, GG, (RA PROBIOTIC DIGESTIVE CARE) CAPS Take 1 capsule by mouth in the morning.     MAGNESIUM PO Take by mouth.     Multiple Vitamin (MULTIVITAMIN WITH MINERALS) TABS tablet Take 1 tablet by mouth daily. Centrum Silver for Men 50+     tadalafil (CIALIS) 20 MG tablet Take 20 mg by mouth daily as needed for erectile dysfunction.      valsartan (DIOVAN) 160 MG tablet Take 1 tablet (160 mg total) by mouth daily. 90 tablet 3   No  facility-administered medications prior to visit.   Final Medications at End of Visit    Current Meds   Medication Sig   allopurinol (ZYLOPRIM) 100 MG tablet Take 100 mg by mouth in the morning.    aspirin EC 81 MG EC tablet Take 1 tablet (81 mg total) by mouth daily. Swallow whole.   atorvastatin (LIPITOR) 10 MG tablet Take 10 mg by mouth in the morning.   Lactobacillus Rhamnosus, GG, (RA PROBIOTIC DIGESTIVE CARE) CAPS Take 1 capsule by mouth in the morning.   MAGNESIUM PO Take by mouth.   metoprolol succinate (TOPROL XL) 25 MG 24 hr tablet Take 1 tablet (25 mg total) by mouth daily.   Multiple Vitamin (MULTIVITAMIN WITH MINERALS) TABS tablet Take 1 tablet by mouth daily. Centrum Silver for Men 50+   tadalafil (CIALIS) 20 MG tablet Take 20 mg by mouth daily as needed for erectile dysfunction.    valsartan (DIOVAN) 160 MG tablet Take 1 tablet (160 mg total) by mouth daily.   Radiology:   No results found.   Chest x-ray 09/22/2020: 1. Marked reduction in size of the left pleural effusion status post thoracentesis. No pneumothorax. 2. Borderline enlargement of the cardiopericardial silhouette.  CT chest 09/23/2020: 1. Moderate size pericardial effusion has developed since the previous study. 2. Trace amount of right pleural fluid layering dependently with minimal dependent atelectasis. Small left effusion layering dependently with mild dependent atelectasis. 3. Small pulmonary emboli seen previously cannot be visualized in the absence of contrast. 4. Aortic atherosclerosis.  Coronary artery calcification.  Aortic Atherosclerosis (ICD10-I70.0).  Cardiac Studies:   Coronary angiography 09/25/2020 LM: Normal LAD: Type 1 LAD that does not reach apex          Medial calcification without any luminal stenosis         Large Diag 1 that reaches apex and also gives off septal perforators, making it a dual LAD system          No other diagonals present LCx: Normal Ramus: Normal RCA: Large, gives of several septal perforators, reaches apex         Minimal luminal irregularities No  significant coronary artery disease  Echocardiogram 12/05/2021:  LVEF 60 to 65%, moderate LVH, no significant valvular heart disease, aortic root 38 mm ascending aorta 40 mm.  EP Study/ Aflutter Ablation 04/21/22: CONCLUSIONS:   1.  Typical appearing atrial flutter    2. Successful radiofrequency ablation of atrial flutter along the cavotricuspid isthmus with complete bidirectional isthmus block achieved.   3. No inducible arrhythmias following ablation.   4. No early apparent complications.    EKG:  EKG 08/11/22: Normal sinus rhythm at a rate of 75 bpm.  Left axis. T wave inversion in lateral leads, consider possible ischemia without underlying injury pattern.  Compared to EKG on 03/11/2022 no significant change.  Assessment     ICD-10-CM   1. Typical atrial flutter  I48.3 EKG 12-Lead    CANCELED: EKG 12-Lead       There are no discontinued medications.   Meds ordered this encounter  Medications   metoprolol succinate (TOPROL XL) 25 MG 24 hr tablet    Sig: Take 1 tablet (25 mg total) by mouth daily.    Dispense:  90 tablet    Refill:  3    This patients CHA2DS2-VASc Score 3 (HTN, vasc, age) and yearly risk of stroke 3.2%.   Recommendations:   Typical atrial flutter (HCC) No recurrence of atrial flutter status post atrial flutter  ablation on 04/21/2022.  He is on aspirin 81 mg daily without bleeding diathesis.  Primary hypertension Does monitor his blood pressure at home.  Home readings are 130s over 80s.  He did have questions about diltiazem and spironolactone and wanted to come off these medications. Will discontinue and start single agent for blood pressure control. We will start valsartan 160 mg daily. Advised him to check blood pressure at least 2-3 times weekly keep a log of home readings.  Goal BP less than 140/80.    He does have an appointment scheduled with his PCP in February and will have labs completed this visit.  Overall, he is doing well from a  cardiovascular standpoint.  We will see him back in 6 months for blood pressure follow-up.  Follow-up in 6 months, sooner if needed.    Floydene Flock, DO 02/09/2023, 11:26 AM Office: [phone]

## 2023-02-10 ENCOUNTER — Ambulatory Visit: Payer: Federal, State, Local not specified - PPO | Admitting: Internal Medicine

## 2023-03-23 ENCOUNTER — Emergency Department (HOSPITAL_COMMUNITY)
Admission: EM | Admit: 2023-03-23 | Discharge: 2023-03-24 | Disposition: A | Discharge status: Home / Self Care | Payer: Federal, State, Local not specified - PPO | Attending: Student | Admitting: Student

## 2023-03-23 ENCOUNTER — Emergency Department (HOSPITAL_COMMUNITY): Payer: Federal, State, Local not specified - PPO

## 2023-03-23 ENCOUNTER — Other Ambulatory Visit: Payer: Self-pay

## 2023-03-23 DIAGNOSIS — J441 Chronic obstructive pulmonary disease with (acute) exacerbation: Secondary | ICD-10-CM | POA: Diagnosis not present

## 2023-03-23 DIAGNOSIS — I5021 Acute systolic (congestive) heart failure: Secondary | ICD-10-CM | POA: Insufficient documentation

## 2023-03-23 DIAGNOSIS — F17290 Nicotine dependence, other tobacco product, uncomplicated: Secondary | ICD-10-CM | POA: Insufficient documentation

## 2023-03-23 DIAGNOSIS — I110 Hypertensive heart disease with heart failure: Secondary | ICD-10-CM | POA: Diagnosis not present

## 2023-03-23 DIAGNOSIS — R0602 Shortness of breath: Secondary | ICD-10-CM | POA: Diagnosis present

## 2023-03-23 DIAGNOSIS — Z7982 Long term (current) use of aspirin: Secondary | ICD-10-CM | POA: Insufficient documentation

## 2023-03-23 DIAGNOSIS — Z79899 Other long term (current) drug therapy: Secondary | ICD-10-CM | POA: Diagnosis not present

## 2023-03-23 DIAGNOSIS — R0600 Dyspnea, unspecified: Secondary | ICD-10-CM

## 2023-03-23 LAB — CBC WITH DIFFERENTIAL/PLATELET
Abs Immature Granulocytes: 0.03 10*3/uL (ref 0.00–0.07)
Basophils Absolute: 0 10*3/uL (ref 0.0–0.1)
Basophils Relative: 0 %
Eosinophils Absolute: 0.3 10*3/uL (ref 0.0–0.5)
Eosinophils Relative: 4 %
HCT: 44 % (ref 39.0–52.0)
Hemoglobin: 14.6 g/dL (ref 13.0–17.0)
Immature Granulocytes: 0 %
Lymphocytes Relative: 13 %
Lymphs Abs: 1 10*3/uL (ref 0.7–4.0)
MCH: 32.6 pg (ref 26.0–34.0)
MCHC: 33.2 g/dL (ref 30.0–36.0)
MCV: 98.2 fL (ref 80.0–100.0)
Monocytes Absolute: 0.5 10*3/uL (ref 0.1–1.0)
Monocytes Relative: 6 %
Neutro Abs: 5.4 10*3/uL (ref 1.7–7.7)
Neutrophils Relative %: 77 %
Platelets: 191 10*3/uL (ref 150–400)
RBC: 4.48 MIL/uL (ref 4.22–5.81)
RDW: 12.9 % (ref 11.5–15.5)
WBC: 7.2 10*3/uL (ref 4.0–10.5)
nRBC: 0 % (ref 0.0–0.2)

## 2023-03-23 LAB — COMPREHENSIVE METABOLIC PANEL
ALT: 20 U/L (ref 0–44)
AST: 20 U/L (ref 15–41)
Albumin: 3.5 g/dL (ref 3.5–5.0)
Alkaline Phosphatase: 56 U/L (ref 38–126)
Anion gap: 11 (ref 5–15)
BUN: 9 mg/dL (ref 8–23)
CO2: 21 mmol/L — ABNORMAL LOW (ref 22–32)
Calcium: 9.4 mg/dL (ref 8.9–10.3)
Chloride: 108 mmol/L (ref 98–111)
Creatinine, Ser: 0.69 mg/dL (ref 0.61–1.24)
GFR, Estimated: >60 mL/min (ref >60)
Glucose, Bld: 124 mg/dL — ABNORMAL HIGH (ref 70–99)
Potassium: 4.1 mmol/L (ref 3.5–5.1)
Sodium: 140 mmol/L (ref 135–145)
Total Bilirubin: 0.3 mg/dL (ref 0.3–1.2)
Total Protein: 6.4 g/dL — ABNORMAL LOW (ref 6.5–8.1)

## 2023-03-23 LAB — I-STAT VENOUS BLOOD GAS, ED
Acid-base deficit: 2 mmol/L (ref 0.0–2.0)
Bicarbonate: 22.7 mmol/L (ref 20.0–28.0)
Calcium, Ion: 1.23 mmol/L (ref 1.15–1.40)
HCT: 43 % (ref 39.0–52.0)
Hemoglobin: 14.6 g/dL (ref 13.0–17.0)
O2 Saturation: 80 %
Potassium: 4.1 mmol/L (ref 3.5–5.1)
Sodium: 141 mmol/L (ref 135–145)
TCO2: 24 mmol/L (ref 22–32)
pCO2, Ven: 38.6 mmHg — ABNORMAL LOW (ref 44–60)
pH, Ven: 7.376 (ref 7.25–7.43)
pO2, Ven: 45 mmHg (ref 32–45)

## 2023-03-23 LAB — TROPONIN I (HIGH SENSITIVITY): Troponin I (High Sensitivity): 8 ng/L (ref <18)

## 2023-03-23 MED ORDER — MAGNESIUM SULFATE 2 GM/50ML IV SOLN
2.0000 g | Freq: Once | INTRAVENOUS | Status: AC
  Administered 2023-03-23: 2 g via INTRAVENOUS
  Filled 2023-03-23: qty 50

## 2023-03-23 MED ORDER — ALBUTEROL SULFATE (2.5 MG/3ML) 0.083% IN NEBU
10.0000 mg | INHALATION_SOLUTION | Freq: Once | RESPIRATORY_TRACT | Status: AC
  Administered 2023-03-23: 10 mg via RESPIRATORY_TRACT
  Filled 2023-03-23: qty 12

## 2023-03-23 MED ORDER — IPRATROPIUM BROMIDE 0.02 % IN SOLN
0.5000 mg | Freq: Once | RESPIRATORY_TRACT | Status: AC
  Administered 2023-03-23: 0.5 mg via RESPIRATORY_TRACT
  Filled 2023-03-23: qty 2.5

## 2023-03-23 NOTE — ED Triage Notes (Signed)
Pt arrived via ems c/o sob x 5 days was originally 87% ra given 125 solumedrol and 2 duonebs enroute vs improved 96% ra 109 134/82 20

## 2023-03-24 LAB — TROPONIN I (HIGH SENSITIVITY): Troponin I (High Sensitivity): 6 ng/L (ref <18)

## 2023-03-24 MED ORDER — IPRATROPIUM-ALBUTEROL 0.5-2.5 (3) MG/3ML IN SOLN
3.0000 mL | Freq: Once | RESPIRATORY_TRACT | Status: AC
  Administered 2023-03-24: 3 mL via RESPIRATORY_TRACT
  Filled 2023-03-24: qty 3

## 2023-03-24 MED ORDER — GUAIFENESIN 100 MG/5ML PO LIQD: 5.0000 mL | ORAL | 0 refills | Status: DC | PRN

## 2023-03-24 MED ORDER — AZITHROMYCIN 250 MG PO TABS: 250.0000 mg | ORAL_TABLET | Freq: Every day | ORAL | 0 refills | Status: DC

## 2023-03-24 MED ORDER — ALBUTEROL SULFATE HFA 108 (90 BASE) MCG/ACT IN AERS: 1.0000 | INHALATION_SPRAY | Freq: Four times a day (QID) | RESPIRATORY_TRACT | 0 refills | Status: AC | PRN

## 2023-03-24 MED ORDER — PREDNISONE 50 MG PO TABS: 50.0000 mg | ORAL_TABLET | Freq: Every day | ORAL | 0 refills | Status: AC

## 2023-03-24 NOTE — ED Notes (Signed)
Pt provided with AVS.  Education complete; all questions answered.  Pt leaving ED in stable condition at this time, ambulatory with all belongings. 

## 2023-03-24 NOTE — Discharge Instructions (Addendum)
It was a pleasure caring for you today in the emergency department. ° °Please return to the emergency department for any worsening or worrisome symptoms. ° ° °

## 2023-03-24 NOTE — ED Provider Notes (Signed)
[place] EMERGENCY DEPARTMENT AT [facility] Provider Note  CSN: [id number] Arrival date & time: 03/23/23 2214  Chief Complaint(s) Shortness of Breath (Arrived via ems sob from home initial sat 87% ra given 125 solumedrol 2 duoneb ems vs 96%ra 20 134/92 109 )  HPI Carl [name] is a 67 y.o. male with PMH HTN, a flutter status post ablation, previous PE and no longer on anticoagulation, COPD who presents emergency department for evaluation of shortness of breath.  Patient states that his symptoms have been gradually worsening over the last 5 days and was found saturating 87% on room air by EMS with wheezing bilaterally.  He was given 125 Solu-Medrol and 2 DuoNeb's prior to arrival.  Here in the emergency room, patient is saturating 96% on room air but does have persistent wheezing and is having to take breaths midsentence due to his shortness of breath.  He currently denies chest pain, abdominal pain, nausea, vomiting or other systemic symptoms.   Past Medical History Past Medical History:  Diagnosis Date   Hypercholesteremia    Primary hypertension    Pulmonary embolism (HCC) 08/27/2020   Typical atrial flutter (HCC) 08/27/2020 with PE   Patient Active Problem List   Diagnosis Date Noted   Secondary hypercoagulable state (HCC) 05/08/2022   Atrial fibrillation (HCC) 12/05/2021   Atypical atrial flutter (HCC)    Hypercholesteremia    Tobacco abuse 09/26/2020   Alcohol abuse 09/26/2020   Acute hypoxemic respiratory failure (HCC) 09/26/2020   Acute lower UTI 09/26/2020   Long term (current) use of anticoagulants    Pericardial effusion    Hypokalemia    Paroxysmal atrial flutter (HCC) 09/20/2020   Unspecified atrial fibrillation (HCC) 09/20/2020   Community acquired pneumonia    Dyspnea    Long term current use of antiarrhythmic drug    Dyslipidemia 09/19/2020   Class 1 obesity 09/19/2020   Acute systolic CHF (congestive heart failure) (HCC) 09/19/2020   Gout  09/19/2020   Pleural effusion on left 09/19/2020   Chronic pulmonary embolism (HCC) 08/28/2020   Primary hypertension 08/27/2020   Typical atrial flutter (HCC) 08/27/2020   Acute pulmonary embolism (HCC) 08/27/2020   Home Medication(s) Prior to Admission medications   Medication Sig Start Date End Date Taking? Authorizing Provider  albuterol (VENTOLIN HFA) 108 (90 Base) MCG/ACT inhaler Inhale 1-2 puffs into the lungs every 6 (six) hours as needed for wheezing or shortness of breath. 03/24/23  Yes Tanda Rockers A, DO  azithromycin (ZITHROMAX) 250 MG tablet Take 1 tablet (250 mg total) by mouth daily. Take first 2 tablets together, then 1 every day until finished. 03/24/23  Yes Tanda Rockers A, DO  guaiFENesin (ROBITUSSIN) 100 MG/5ML liquid Take 5 mLs by mouth every 4 (four) hours as needed for cough or to loosen phlegm. 03/24/23  Yes Sloan Leiter, DO  predniSONE (DELTASONE) 50 MG tablet Take 1 tablet (50 mg total) by mouth daily for 5 days. 03/24/23 03/29/23 Yes Sloan Leiter, DO  allopurinol (ZYLOPRIM) 100 MG tablet Take 100 mg by mouth in the morning.  02/20/20   [provider]  aspirin EC 81 MG EC tablet Take 1 tablet (81 mg total) by mouth daily. Swallow whole. 09/27/20   Noralee Stain, DO  atorvastatin (LIPITOR) 10 MG tablet Take 10 mg by mouth in the morning.    [provider]  Lactobacillus Rhamnosus, GG, (RA PROBIOTIC DIGESTIVE CARE) CAPS Take 1 capsule by mouth in the morning.    [provider]  MAGNESIUM PO Take by mouth. 09/10/21   [provider]  metoprolol succinate (TOPROL XL) 25 MG 24 hr tablet Take 1 tablet (25 mg total) by mouth daily. 02/09/23   Custovic, Rozell Searing, DO  Multiple Vitamin (MULTIVITAMIN WITH MINERALS) TABS tablet Take 1 tablet by mouth daily. Centrum Silver for Men 50+    [provider]  tadalafil (CIALIS) 20 MG tablet Take 20 mg by mouth daily as needed for erectile dysfunction.  02/17/20   [provider]  valsartan  (DIOVAN) 160 MG tablet Take 1 tablet (160 mg total) by mouth daily. 08/11/22   Nori Riis, NP                                                                                                                                    Past Surgical History Past Surgical History:  Procedure Laterality Date   A-FLUTTER ABLATION N/A 04/21/2022   Procedure: A-FLUTTER ABLATION;  Surgeon: Lanier Prude, MD;  Location: [facility] INVASIVE CV LAB;  Service: Cardiovascular;  Laterality: N/A;   IR THORACENTESIS ASP PLEURAL SPACE W/IMG GUIDE  09/20/2020   LAPAROTOMY     LEFT HEART CATH AND CORONARY ANGIOGRAPHY N/A 09/25/2020   Procedure: LEFT HEART CATH AND CORONARY ANGIOGRAPHY;  Surgeon: Elder Negus, MD;  Location: [facility] INVASIVE CV LAB;  Service: Cardiovascular;  Laterality: N/A;   Family History Family History  Problem Relation Age of Onset   CAD Mother 71   Heart attack Father    Cancer Sister        lung   Heart disease Brother    Heart failure Brother    Heart attack Brother     Social History Social History   Tobacco Use   Smoking status: Some Days    Types: Cigars   Smokeless tobacco: Never   Tobacco comments:    Once a week 05/08/22  Vaping Use   Vaping Use: Never used  Substance Use Topics   Alcohol use: Yes    Alcohol/week: 6.0 - 8.0 standard drinks of alcohol    Types: 6 - 8 Glasses of wine per week    Comment: 3-4 glasses of wine twice a week 05/08/22   Drug use: Yes    Types: Marijuana    Comment: 1 edible nightly 05/08/22   Allergies Patient has no known allergies.  Review of Systems Review of Systems  Respiratory:  Positive for cough, shortness of breath and wheezing.     Physical Exam Vital Signs  I have reviewed the triage vital signs BP (!) 148/95   Pulse (!) 124   Temp 98 F (36.7 C) (Oral)   Resp 14   Ht 5\' 10"  (1.778 m)   Wt 110 kg   SpO2 97%   BMI 34.80 kg/m   Physical Exam Constitutional:      General: He is not in acute distress.     Appearance: Normal appearance.  HENT:     Head: Normocephalic and atraumatic.     Nose: No congestion or rhinorrhea.  Eyes:     General:        Right eye: No discharge.        Left eye: No discharge.     Extraocular Movements: Extraocular movements intact.     Pupils: Pupils are equal, round, and reactive to light.  Cardiovascular:     Rate and Rhythm: Normal rate and regular rhythm.     Heart sounds: No murmur heard. Pulmonary:     Effort: No respiratory distress.     Breath sounds: Wheezing present. No rales.  Abdominal:     General: There is no distension.     Tenderness: There is no abdominal tenderness.  Musculoskeletal:        General: Normal range of motion.     Cervical back: Normal range of motion.  Skin:    General: Skin is warm and dry.  Neurological:     General: No focal deficit present.     Mental Status: He is alert.     ED Results and Treatments Labs (all labs ordered are listed, but only abnormal results are displayed) Labs Reviewed  COMPREHENSIVE METABOLIC PANEL - Abnormal; Notable for the following components:      Result Value   CO2 21 (*)    Glucose, Bld 124 (*)    Total Protein 6.4 (*)    All other components within normal limits  I-STAT VENOUS BLOOD GAS, ED - Abnormal; Notable for the following components:   pCO2, Ven 38.6 (*)    All other components within normal limits  CBC WITH DIFFERENTIAL/PLATELET  TROPONIN I (HIGH SENSITIVITY)  TROPONIN I (HIGH SENSITIVITY)                                                                                                                          Radiology DG Chest Portable 1 View  Result Date: 03/23/2023 CLINICAL DATA:  Dyspnea EXAM: PORTABLE CHEST 1 VIEW COMPARISON:  12/05/2021 FINDINGS: The heart size and mediastinal contours are within normal limits. Both lungs are clear. The visualized skeletal structures are unremarkable. IMPRESSION: No active disease. Electronically Signed   By: Alcide Clever M.D.    On: 03/23/2023 22:41    Pertinent labs & imaging results that were available during my care of the patient were reviewed by me and considered in my medical decision making (see MDM for details).  Medications Ordered in ED Medications  albuterol (PROVENTIL) (2.5 MG/3ML) 0.083% nebulizer solution 10 mg (10 mg Nebulization Given 03/23/23 2250)  magnesium sulfate IVPB 2 g 50 mL (0 g Intravenous Stopped 03/24/23 0029)  ipratropium (ATROVENT) nebulizer solution 0.5 mg (0.5 mg Nebulization Given 03/23/23 2250)  ipratropium-albuterol (DUONEB) 0.5-2.5 (3) MG/3ML nebulizer solution 3 mL (3 mLs Nebulization Given 03/24/23 0333)  Procedures .Critical Care  Performed by: Glendora Score, MD Authorized by: Glendora Score, MD   Critical care provider statement:    Critical care time (minutes):  30   Critical care was necessary to treat or prevent imminent or life-threatening deterioration of the following conditions:  Respiratory failure   Critical care was time spent personally by me on the following activities:  Development of treatment plan with patient or surrogate, discussions with consultants, evaluation of patient's response to treatment, examination of patient, ordering and review of laboratory studies, ordering and review of radiographic studies, ordering and performing treatments and interventions, pulse oximetry, re-evaluation of patient's condition and review of old charts   (including critical care time)  Medical Decision Making / ED Course   This patient presents to the ED for concern of shortness of breath, wheezing, this involves an extensive number of treatment options, and is a complaint that carries with it a high risk of complications and morbidity.  The differential diagnosis includes Pe, PTX, Pulmonary Edema, ARDS, COPD/Asthma, ACS, CHF exacerbation,  Arrhythmia, Pericardial Effusion/Tamponade, Anemia, Sepsis, Acidosis/Hypercapnia, Anxiety, Viral URI  MDM: Patient seen emergency room for evaluation of shortness of breath and wheezing.  Physical exam with mild tachypnea, expiratory wheezing bilaterally.  Laboratory evaluation largely unremarkable with a pH of 7.37 and no significant hypercarbia.  Patient received a continuous hour-long albuterol treatment with an additional Atrovent treatment as well as magnesium and disposition is pending reevaluation by oncoming provider.  Please see provider signout for continuation of workup.   Additional history obtained:  -External records from outside source obtained and reviewed including: Chart review including previous notes, labs, imaging, consultation notes   Lab Tests: -I ordered, reviewed, and interpreted labs.   The pertinent results include:   Labs Reviewed  COMPREHENSIVE METABOLIC PANEL - Abnormal; Notable for the following components:      Result Value   CO2 21 (*)    Glucose, Bld 124 (*)    Total Protein 6.4 (*)    All other components within normal limits  I-STAT VENOUS BLOOD GAS, ED - Abnormal; Notable for the following components:   pCO2, Ven 38.6 (*)    All other components within normal limits  CBC WITH DIFFERENTIAL/PLATELET  TROPONIN I (HIGH SENSITIVITY)  TROPONIN I (HIGH SENSITIVITY)      EKG   EKG Interpretation  Date/Time:  Monday Mar 23 2023 22:17:48 EDT Ventricular Rate:  106 PR Interval:  186 QRS Duration: 97 QT Interval:  358 QTC Calculation: 476 R Axis:   -61 Text Interpretation: Sinus tachycardia Left anterior fascicular block Confirmed by Shuntia Exton ([id number]) on 03/23/2023 10:28:07 PM         Imaging Studies ordered: I ordered imaging studies including chest x-ray I independently visualized and interpreted imaging. I agree with the radiologist interpretation   Medicines ordered and prescription drug management: Meds ordered this encounter   Medications   albuterol (PROVENTIL) (2.5 MG/3ML) 0.083% nebulizer solution 10 mg   magnesium sulfate IVPB 2 g 50 mL   ipratropium (ATROVENT) nebulizer solution 0.5 mg   ipratropium-albuterol (DUONEB) 0.5-2.5 (3) MG/3ML nebulizer solution 3 mL   albuterol (VENTOLIN HFA) 108 (90 Base) MCG/ACT inhaler    Sig: Inhale 1-2 puffs into the lungs every 6 (six) hours as needed for wheezing or shortness of breath.    Dispense:  1 each    Refill:  0   predniSONE (DELTASONE) 50 MG tablet    Sig: Take 1 tablet (50 mg total) by mouth daily  for 5 days.    Dispense:  5 tablet    Refill:  0   guaiFENesin (ROBITUSSIN) 100 MG/5ML liquid    Sig: Take 5 mLs by mouth every 4 (four) hours as needed for cough or to loosen phlegm.    Dispense:  120 mL    Refill:  0   azithromycin (ZITHROMAX) 250 MG tablet    Sig: Take 1 tablet (250 mg total) by mouth daily. Take first 2 tablets together, then 1 every day until finished.    Dispense:  6 tablet    Refill:  0    -I have reviewed the patients home medicines and have made adjustments as needed  Critical interventions Albuterol, magnesium, Atrovent    Cardiac Monitoring: The patient was maintained on a cardiac monitor.  I personally viewed and interpreted the cardiac monitored which showed an underlying rhythm of: Sinus tachycardia  Social Determinants of Health:  Factors impacting patients care include: none   Reevaluation: After the interventions noted above, I reevaluated the patient and found that they have :improved  Co morbidities that complicate the patient evaluation  Past Medical History:  Diagnosis Date   Hypercholesteremia    Primary hypertension    Pulmonary embolism (HCC) 08/27/2020   Typical atrial flutter (HCC) 08/27/2020 with PE      Dispostion: I considered admission for this patient, and disposition pending reevaluation by oncoming provider.  Please see provider signout for continuation of workup     Final Clinical  Impression(s) / ED Diagnoses Final diagnoses:  COPD exacerbation (HCC)  Dyspnea, unspecified type     @PCDICTATION @    Glendora Score, MD 03/24/23 1521

## 2023-03-24 NOTE — ED Provider Notes (Signed)
  Provider Note MRN:  [id number]  Arrival date & time: 03/24/23    ED Course and Medical Decision Making  Assumed care from Dr Audrie Lia at shift change.  See note from prior team for complete details, in brief:  67 year old male history of COPD, not on home oxygen; afib on asa, PE Dyspnea, cough, fatigue over the last 3 days Hypoxic en route with EMS mid 80s requiring supplemental oxygen Wheezing on arrival, patient require submental oxygen Given steroids, mag sulfate, 1 hour With mild improvement to his symptoms Concern for COPD exacerbation, he has no hypoxia, no exertional dyspnea or hypoxia.  Baseline pulse ox approximately 93-95%.  He is 95% on ambient air.  With exertion he dropped at lowest 93%.  No conversational dyspnea.  He has tachycardia secondary to albuterol treatments.  Trace wheezing on exam greatly improved.  Labs stable.  pH stable, no CO2 retention.  No pneumonia on chest x-ray Wheezing improved, no hypoxia, ambulatory w/o hypoxia  Patient feeling better after breathing treatments, plan to discharge home with MDI, prednisone, expectorant, azithromycin; follow-up PCP  The patient improved significantly and was discharged in stable condition. Detailed discussions were had with the patient regarding current findings, and need for close f/u with PCP or on call doctor. The patient has been instructed to return immediately if the symptoms worsen in any way for re-evaluation. Patient verbalized understanding and is in agreement with current care plan. All questions answered prior to discharge.      Procedures  Final Clinical Impressions(s) / ED Diagnoses     ICD-10-CM   1. COPD exacerbation (HCC)  J44.1     2. Dyspnea, unspecified type  R06.00       ED Discharge Orders          Ordered    albuterol (VENTOLIN HFA) 108 (90 Base) MCG/ACT inhaler  Every 6 hours PRN        03/24/23 0321    predniSONE (DELTASONE) 50 MG tablet  Daily        03/24/23 0321    guaiFENesin  (ROBITUSSIN) 100 MG/5ML liquid  Every 4 hours PRN        03/24/23 0321    azithromycin (ZITHROMAX) 250 MG tablet  Daily        03/24/23 0321              Discharge Instructions      It was a pleasure caring for you today in the emergency department.  Please return to the emergency department for any worsening or worrisome symptoms.          Sloan Leiter, DO 03/24/23 [phone]

## 2023-06-18 ENCOUNTER — Other Ambulatory Visit: Payer: Self-pay

## 2023-06-18 DIAGNOSIS — I10 Essential (primary) hypertension: Secondary | ICD-10-CM

## 2023-08-10 ENCOUNTER — Ambulatory Visit: Payer: Federal, State, Local not specified - PPO | Admitting: Cardiology

## 2023-08-11 ENCOUNTER — Ambulatory Visit: Payer: Federal, State, Local not specified - PPO | Admitting: Internal Medicine

## 2023-08-12 ENCOUNTER — Ambulatory Visit: Payer: Federal, State, Local not specified - PPO | Admitting: Cardiology

## 2023-08-12 ENCOUNTER — Ambulatory Visit: Payer: Federal, State, Local not specified - PPO | Attending: Internal Medicine | Admitting: Cardiology

## 2023-08-12 ENCOUNTER — Encounter: Payer: Self-pay | Admitting: Cardiology

## 2023-08-12 VITALS — BP 142/92 | HR 78 | Resp 16 | Ht 70.0 in | Wt 240.0 lb

## 2023-08-12 DIAGNOSIS — E7800 Pure hypercholesterolemia, unspecified: Secondary | ICD-10-CM

## 2023-08-12 DIAGNOSIS — I483 Typical atrial flutter: Secondary | ICD-10-CM | POA: Diagnosis not present

## 2023-08-12 DIAGNOSIS — I10 Essential (primary) hypertension: Secondary | ICD-10-CM

## 2023-08-12 NOTE — Progress Notes (Signed)
  Cardiology Office Note:  .   Date:  08/12/2023  ID:  Carl [name], DOB Feb 05, 1956, MRN [id number] PCP: Bernita Buffy   [facility] Providers Cardiologist:  Truett Mainland, MD PCP: Roger Kill, PA-C  Chief Complaint:  Chief Complaint  Patient presents with   Typical atrial flutter    Follow-up    6 months      History of Present Illness: .   Carl [name] is a 67 y.o. male with hypertension, hyperlipidemia, COPD, atrial flutter s/p ablation.  Patient has had cardioversion in 08/2020.  Subsequently, he underwent ablation by Dr. Lalla Brothers in 04/2022.  Patient had 1 episode of rapid heart rate up to 160 bpm 2 months ago.  This was after a night of "enjoying his wine", but has not happened since.  He does drink 4/7 nights of the week, usually 1 to 2 glasses of wine, but occasionally up to 4 glasses of wine.  Vitals:   08/12/23 0938  BP: (!) 142/92  Pulse: 78  Resp: 16  SpO2: 96%     ROS:  Review of Systems  Cardiovascular:  Negative for chest pain, dyspnea on exertion, leg swelling, palpitations and syncope.     Studies Reviewed: Marland Kitchen       EKG 08/12/2023: Normal sinus rhythm Left anterior fascicular block When compared with ECG of 23-Mar-2023 22:17, PREVIOUS ECG IS PRESENT       Physical Exam:   Physical Exam Vitals and nursing note reviewed.  Constitutional:      General: He is not in acute distress. Neck:     Vascular: No JVD.  Cardiovascular:     Rate and Rhythm: Normal rate and regular rhythm.     Heart sounds: Normal heart sounds. No murmur heard. Pulmonary:     Effort: Pulmonary effort is normal.     Breath sounds: Normal breath sounds. No wheezing or rales.  Musculoskeletal:     Right lower leg: No edema.     Left lower leg: No edema.       VISIT DIAGNOSES:   ICD-10-CM   1. Essential hypertension  I10     2. Typical atrial flutter (HCC)  I48.3     3. Hypercholesteremia  E78.00        ASSESSMENT AND PLAN:  .    Carl [name] is a 67 y.o. male with hypertension, hyperlipidemia, COPD, atrial flutter s/p ablation.  Atrial flutter: S/p ablation. One episode of rapid heart beat up to 160 bpm in summer 2024, after excessive alcohol drinking.  He takes metoprolol only with episodes of rapid heartbeat, chest abdomen. Given no A-fib documented before, and regular use of alcohol, have stopped his aspirin. He is looking forward to his annual appearances as [place].  I discussed with the patient regarding reducing alcohol intake 1-2: Couple times a week.  Patient is agreeable.  F/u in 1 year  Signed, Elder Negus, MD

## 2023-08-12 NOTE — Patient Instructions (Signed)
Medication Instructions:  STOP ASPIRIN  *If you need a refill on your cardiac medications before your next appointment, please call your pharmacy*   Lab Work:  If you have labs (blood work) drawn today and your tests are completely normal, you will receive your results only by: MyChart Message (if you have MyChart) OR A paper copy in the mail If you have any lab test that is abnormal or we need to change your treatment, we will call you to review the results.   Testing/Procedures:    Follow-Up: At [facility], you and your health needs are our priority.  As part of our continuing mission to provide you with exceptional heart care, we have created designated Provider Care Teams.  These Care Teams include your primary Cardiologist (physician) and Advanced Practice Providers (APPs -  Physician Assistants and Nurse Practitioners) who all work together to provide you with the care you need, when you need it.  We recommend signing up for the patient portal called "MyChart".  Sign up information is provided on this After Visit Summary.  MyChart is used to connect with patients for Virtual Visits (Telemedicine).  Patients are able to view lab/test results, encounter notes, upcoming appointments, etc.  Non-urgent messages can be sent to your provider as well.   To learn more about what you can do with MyChart, go to [email or web address].    Your next appointment:   1  year(s) DR [facility]     Other Instructions

## 2024-08-16 ENCOUNTER — Other Ambulatory Visit: Payer: Self-pay

## 2024-08-16 DIAGNOSIS — I10 Essential (primary) hypertension: Secondary | ICD-10-CM

## 2024-08-17 MED ORDER — VALSARTAN 160 MG PO TABS
160.0000 mg | ORAL_TABLET | Freq: Every day | ORAL | 0 refills | Status: DC
Start: 2024-08-17 — End: 2024-09-14

## 2024-09-10 ENCOUNTER — Other Ambulatory Visit: Payer: Self-pay | Admitting: Cardiology

## 2024-09-10 DIAGNOSIS — I10 Essential (primary) hypertension: Secondary | ICD-10-CM

## 2024-09-14 ENCOUNTER — Other Ambulatory Visit: Payer: Self-pay | Admitting: Cardiology

## 2024-09-14 DIAGNOSIS — I10 Essential (primary) hypertension: Secondary | ICD-10-CM

## 2024-09-27 ENCOUNTER — Other Ambulatory Visit: Payer: Self-pay | Admitting: Cardiology

## 2024-09-27 DIAGNOSIS — I10 Essential (primary) hypertension: Secondary | ICD-10-CM

## 2024-12-02 ENCOUNTER — Emergency Department (HOSPITAL_COMMUNITY)

## 2024-12-02 ENCOUNTER — Encounter (HOSPITAL_COMMUNITY): Payer: Self-pay | Admitting: Family Medicine

## 2024-12-02 ENCOUNTER — Other Ambulatory Visit: Payer: Self-pay

## 2024-12-02 ENCOUNTER — Inpatient Hospital Stay (HOSPITAL_COMMUNITY)
Admission: EM | Admit: 2024-12-02 | Discharge: 2024-12-06 | DRG: 308 | Disposition: A | Discharge status: Home / Self Care | Attending: Internal Medicine | Admitting: Internal Medicine

## 2024-12-02 DIAGNOSIS — F17290 Nicotine dependence, other tobacco product, uncomplicated: Secondary | ICD-10-CM | POA: Diagnosis present

## 2024-12-02 DIAGNOSIS — E66811 Obesity, class 1: Secondary | ICD-10-CM | POA: Diagnosis present

## 2024-12-02 DIAGNOSIS — I4891 Unspecified atrial fibrillation: Secondary | ICD-10-CM | POA: Diagnosis not present

## 2024-12-02 DIAGNOSIS — I5021 Acute systolic (congestive) heart failure: Secondary | ICD-10-CM | POA: Diagnosis not present

## 2024-12-02 DIAGNOSIS — I2699 Other pulmonary embolism without acute cor pulmonale: Secondary | ICD-10-CM | POA: Diagnosis present

## 2024-12-02 DIAGNOSIS — Z79899 Other long term (current) drug therapy: Secondary | ICD-10-CM

## 2024-12-02 DIAGNOSIS — J449 Chronic obstructive pulmonary disease, unspecified: Secondary | ICD-10-CM | POA: Diagnosis present

## 2024-12-02 DIAGNOSIS — I4510 Unspecified right bundle-branch block: Secondary | ICD-10-CM | POA: Diagnosis present

## 2024-12-02 DIAGNOSIS — Z6834 Body mass index (BMI) 34.0-34.9, adult: Secondary | ICD-10-CM

## 2024-12-02 DIAGNOSIS — I484 Atypical atrial flutter: Secondary | ICD-10-CM | POA: Diagnosis present

## 2024-12-02 DIAGNOSIS — Z8249 Family history of ischemic heart disease and other diseases of the circulatory system: Secondary | ICD-10-CM

## 2024-12-02 DIAGNOSIS — I5023 Acute on chronic systolic (congestive) heart failure: Secondary | ICD-10-CM | POA: Diagnosis present

## 2024-12-02 DIAGNOSIS — I480 Paroxysmal atrial fibrillation: Principal | ICD-10-CM | POA: Diagnosis present

## 2024-12-02 DIAGNOSIS — E785 Hyperlipidemia, unspecified: Secondary | ICD-10-CM | POA: Diagnosis present

## 2024-12-02 DIAGNOSIS — Z7901 Long term (current) use of anticoagulants: Secondary | ICD-10-CM

## 2024-12-02 DIAGNOSIS — Z86711 Personal history of pulmonary embolism: Secondary | ICD-10-CM | POA: Diagnosis present

## 2024-12-02 DIAGNOSIS — M109 Gout, unspecified: Secondary | ICD-10-CM | POA: Diagnosis present

## 2024-12-02 DIAGNOSIS — I110 Hypertensive heart disease with heart failure: Principal | ICD-10-CM | POA: Diagnosis present

## 2024-12-02 DIAGNOSIS — I10 Essential (primary) hypertension: Secondary | ICD-10-CM | POA: Diagnosis present

## 2024-12-02 LAB — CBC WITH DIFFERENTIAL/PLATELET
Abs Immature Granulocytes: 0.01 K/uL (ref 0.00–0.07)
Basophils Absolute: 0.1 K/uL (ref 0.0–0.1)
Basophils Relative: 1 %
Eosinophils Absolute: 0.1 K/uL (ref 0.0–0.5)
Eosinophils Relative: 1 %
HCT: 42.1 % (ref 39.0–52.0)
Hemoglobin: 14 g/dL (ref 13.0–17.0)
Immature Granulocytes: 0 %
Lymphocytes Relative: 19 %
Lymphs Abs: 1.3 K/uL (ref 0.7–4.0)
MCH: 34.1 pg — ABNORMAL HIGH (ref 26.0–34.0)
MCHC: 33.3 g/dL (ref 30.0–36.0)
MCV: 102.4 fL — ABNORMAL HIGH (ref 80.0–100.0)
Monocytes Absolute: 0.8 K/uL (ref 0.1–1.0)
Monocytes Relative: 12 %
Neutro Abs: 4.4 K/uL (ref 1.7–7.7)
Neutrophils Relative %: 67 %
Platelets: 148 K/uL — ABNORMAL LOW (ref 150–400)
RBC: 4.11 MIL/uL — ABNORMAL LOW (ref 4.22–5.81)
RDW: 13.9 % (ref 11.5–15.5)
WBC: 6.6 K/uL (ref 4.0–10.5)
nRBC: 0 % (ref 0.0–0.2)

## 2024-12-02 LAB — COMPREHENSIVE METABOLIC PANEL WITH GFR
ALT: 25 U/L (ref 0–44)
AST: 30 U/L (ref 15–41)
Albumin: 3.4 g/dL — ABNORMAL LOW (ref 3.5–5.0)
Alkaline Phosphatase: 71 U/L (ref 38–126)
Anion gap: 11 (ref 5–15)
BUN: 13 mg/dL (ref 8–23)
CO2: 22 mmol/L (ref 22–32)
Calcium: 9.1 mg/dL (ref 8.9–10.3)
Chloride: 108 mmol/L (ref 98–111)
Creatinine, Ser: 0.83 mg/dL (ref 0.61–1.24)
GFR, Estimated: >60 mL/min
Glucose, Bld: 81 mg/dL (ref 70–99)
Potassium: 4.4 mmol/L (ref 3.5–5.1)
Sodium: 141 mmol/L (ref 135–145)
Total Bilirubin: 1 mg/dL (ref 0.0–1.2)
Total Protein: 6 g/dL — ABNORMAL LOW (ref 6.5–8.1)

## 2024-12-02 LAB — PRO BRAIN NATRIURETIC PEPTIDE: Pro Brain Natriuretic Peptide: 232 pg/mL

## 2024-12-02 LAB — TSH: TSH: 1.56 u[IU]/mL (ref 0.350–4.500)

## 2024-12-02 LAB — PROTIME-INR
INR: 1.5 — ABNORMAL HIGH (ref 0.8–1.2)
Prothrombin Time: 18.5 s — ABNORMAL HIGH (ref 11.4–15.2)

## 2024-12-02 LAB — HIV ANTIBODY (ROUTINE TESTING W REFLEX): HIV Screen 4th Generation wRfx: NONREACTIVE

## 2024-12-02 LAB — PHOSPHORUS: Phosphorus: 3 mg/dL (ref 2.5–4.6)

## 2024-12-02 LAB — TROPONIN T, HIGH SENSITIVITY
Troponin T High Sensitivity: 14 ng/L (ref 0–19)
Troponin T High Sensitivity: 13 ng/L (ref 0–19)

## 2024-12-02 LAB — MAGNESIUM
Magnesium: 1.8 mg/dL (ref 1.7–2.4)
Magnesium: 2.1 mg/dL (ref 1.7–2.4)

## 2024-12-02 MED ORDER — METOPROLOL TARTRATE 25 MG PO TABS: 50.0000 mg | ORAL_TABLET | Freq: Four times a day (QID) | ORAL | Status: DC

## 2024-12-02 MED ORDER — ATORVASTATIN CALCIUM 10 MG PO TABS
10.0000 mg | ORAL_TABLET | Freq: Every morning | ORAL | Status: DC
  Administered 2024-12-03 – 2024-12-06 (×4): 10 mg via ORAL
  Filled 2024-12-02 (×5): qty 1

## 2024-12-02 MED ORDER — DILTIAZEM HCL-DEXTROSE 125-5 MG/125ML-% IV SOLN (PREMIX)
5.0000 mg/h | INTRAVENOUS | Status: DC
  Administered 2024-12-02: 5 mg/h via INTRAVENOUS
  Filled 2024-12-02: qty 125

## 2024-12-02 MED ORDER — ADULT MULTIVITAMIN W/MINERALS CH
1.0000 | ORAL_TABLET | Freq: Every day | ORAL | Status: DC
  Administered 2024-12-02 – 2024-12-06 (×5): 1 via ORAL
  Filled 2024-12-02 (×5): qty 1

## 2024-12-02 MED ORDER — TRAZODONE HCL 50 MG PO TABS
25.0000 mg | ORAL_TABLET | Freq: Every evening | ORAL | Status: DC | PRN
  Administered 2024-12-02 – 2024-12-05 (×4): 25 mg via ORAL
  Filled 2024-12-02 (×4): qty 1

## 2024-12-02 MED ORDER — IPRATROPIUM BROMIDE 0.02 % IN SOLN: 0.5000 mg | Freq: Four times a day (QID) | RESPIRATORY_TRACT | Status: DC | PRN

## 2024-12-02 MED ORDER — ACETAMINOPHEN 325 MG PO TABS: 650.0000 mg | ORAL_TABLET | Freq: Four times a day (QID) | ORAL | Status: DC | PRN

## 2024-12-02 MED ORDER — ALLOPURINOL 100 MG PO TABS
100.0000 mg | ORAL_TABLET | Freq: Every morning | ORAL | Status: DC
  Administered 2024-12-03 – 2024-12-06 (×4): 100 mg via ORAL
  Filled 2024-12-02 (×5): qty 1

## 2024-12-02 MED ORDER — ORAL CARE MOUTH RINSE: 15.0000 mL | OROMUCOSAL | Status: DC | PRN

## 2024-12-02 MED ORDER — ONDANSETRON HCL 4 MG/2ML IJ SOLN: 4.0000 mg | Freq: Four times a day (QID) | INTRAMUSCULAR | Status: DC | PRN

## 2024-12-02 MED ORDER — APIXABAN 5 MG PO TABS
5.0000 mg | ORAL_TABLET | Freq: Two times a day (BID) | ORAL | Status: DC
  Administered 2024-12-02 – 2024-12-06 (×9): 5 mg via ORAL
  Filled 2024-12-02 (×10): qty 1

## 2024-12-02 MED ORDER — ONDANSETRON HCL 4 MG PO TABS: 4.0000 mg | ORAL_TABLET | Freq: Four times a day (QID) | ORAL | Status: DC | PRN

## 2024-12-02 MED ORDER — METOPROLOL SUCCINATE ER 25 MG PO TB24: 25.0000 mg | ORAL_TABLET | Freq: Every day | ORAL | Status: DC

## 2024-12-02 MED ORDER — SODIUM CHLORIDE 0.9% FLUSH
3.0000 mL | Freq: Two times a day (BID) | INTRAVENOUS | Status: DC
  Administered 2024-12-02 – 2024-12-05 (×6): 3 mL via INTRAVENOUS

## 2024-12-02 MED ORDER — FUROSEMIDE 10 MG/ML IJ SOLN
60.0000 mg | Freq: Once | INTRAMUSCULAR | Status: AC
  Administered 2024-12-02: 60 mg via INTRAVENOUS
  Filled 2024-12-02: qty 6

## 2024-12-02 MED ORDER — HYDROMORPHONE HCL 1 MG/ML IJ SOLN: 0.5000 mg | INTRAMUSCULAR | Status: DC | PRN

## 2024-12-02 MED ORDER — SODIUM CHLORIDE 0.9% FLUSH
3.0000 mL | Freq: Two times a day (BID) | INTRAVENOUS | Status: DC
  Administered 2024-12-02 – 2024-12-05 (×4): 3 mL via INTRAVENOUS

## 2024-12-02 MED ORDER — BISACODYL 5 MG PO TBEC: 5.0000 mg | DELAYED_RELEASE_TABLET | Freq: Every day | ORAL | Status: DC | PRN

## 2024-12-02 MED ORDER — METOPROLOL TARTRATE 25 MG PO TABS: 25.0000 mg | ORAL_TABLET | Freq: Three times a day (TID) | ORAL | Status: DC

## 2024-12-02 MED ORDER — SENNOSIDES-DOCUSATE SODIUM 8.6-50 MG PO TABS: 1.0000 | ORAL_TABLET | Freq: Every evening | ORAL | Status: DC | PRN

## 2024-12-02 MED ORDER — ACETAMINOPHEN 650 MG RE SUPP: 650.0000 mg | Freq: Four times a day (QID) | RECTAL | Status: DC | PRN

## 2024-12-02 MED ORDER — MAGNESIUM SULFATE 2 GM/50ML IV SOLN
2.0000 g | Freq: Once | INTRAVENOUS | Status: AC
  Administered 2024-12-02: 2 g via INTRAVENOUS
  Filled 2024-12-02: qty 50

## 2024-12-02 MED ORDER — FLEET ENEMA RE ENEM: 1.0000 | ENEMA | Freq: Once | RECTAL | Status: DC | PRN

## 2024-12-02 MED ORDER — HYDRALAZINE HCL 20 MG/ML IJ SOLN: 10.0000 mg | INTRAMUSCULAR | Status: DC | PRN

## 2024-12-02 MED ORDER — SODIUM CHLORIDE 0.9 % IV SOLN: INTRAVENOUS | Status: DC

## 2024-12-02 MED ORDER — METOPROLOL TARTRATE 50 MG PO TABS
50.0000 mg | ORAL_TABLET | Freq: Four times a day (QID) | ORAL | Status: DC
  Administered 2024-12-02 – 2024-12-03 (×4): 50 mg via ORAL
  Filled 2024-12-02: qty 1
  Filled 2024-12-02: qty 2
  Filled 2024-12-02 (×2): qty 1

## 2024-12-02 MED ORDER — DILTIAZEM LOAD VIA INFUSION
20.0000 mg | Freq: Once | INTRAVENOUS | Status: AC
  Administered 2024-12-02: 20 mg via INTRAVENOUS
  Filled 2024-12-02: qty 20

## 2024-12-02 MED ORDER — OXYCODONE HCL 5 MG PO TABS: 5.0000 mg | ORAL_TABLET | ORAL | Status: DC | PRN

## 2024-12-02 NOTE — Assessment & Plan Note (Addendum)
 Hypotension Continue to hold on valsartan .  Continue blood pressure monitoring

## 2024-12-02 NOTE — ED Notes (Signed)
 Heart Room tray ordered for the patient.

## 2024-12-02 NOTE — ED Notes (Signed)
 Pt placed on o2 due to spo2 dropping below 95% and RR >30.

## 2024-12-02 NOTE — H&P (Signed)
 " History and Physical   Patient: Carl [name]                            PCP: Trudy Elodia PARAS, PA-C                    DOB: 1956/09/26            DOA: 12/02/2024 FMW:[id number]             DOS: 12/02/2024, 5:14 PM  Trudy Elodia PARAS, PA-C  Patient coming from:   HOME  I have personally reviewed patient's medical records, in electronic medical records, including:  [facility], and care everywhere.    Chief Complaint:   Palpitation, fatigue,   History of present illness:    Carl [name] is a 69 year old male A-fib (with ablation-has not been on Eliquis  since 2021), HTN, HLD, COPD (not O2 dependent), remote PE (2021), pericardial effusion, systolic CHF ...  Presented with progressive shortness of breath, fatigue, generalized weakness and pains.reporting of palpitation and weight gain for past 2 weeks. Complaining of persistent chest pressure shortness of breath with exertion. Denies of having any fever or chills, nausea or vomiting.   ED Evaluation: POA > 150 Blood pressure 125/82, pulse 97, temperature 98.2 F (36.8 C), temperature source Oral, resp. rate 18, height 5' 10 (1.778 m), weight 110.2 kg, SpO2 100%.   LABs: CBC CMP within normal limits with exception platelet 148 CXR: Cardiomegaly and low lung volumes. No acute findings  EKG: A-fib, with rate of 111, QTc 495, lateral T wave abnormalities, negative any ST elevation or depression  Started on Cardizem  gtt --- Cardiology has been consulted.     Patient Denies having: Fever, Chills, Cough, SOB, Chest Pain, Abd pain, N/V/D, headache, dizziness, lightheadedness,  Dysuria, Joint pain, rash, open wounds   Review of Systems: As per HPI, otherwise 10 point review of systems were negative.   ----------------------------------------------------------------------------------------------------------------------  Allergies[1]  Home MEDs:  Prior to Admission medications  Medication Sig Start Date End Date  Taking? Authorizing Provider  albuterol  (VENTOLIN  HFA) 108 (90 Base) MCG/ACT inhaler Inhale 1-2 puffs into the lungs every 6 (six) hours as needed for wheezing or shortness of breath. 03/24/23  Yes Elnor Savant A, DO  allopurinol  (ZYLOPRIM ) 100 MG tablet Take 100 mg by mouth in the morning.  02/20/20  Yes [provider]  atorvastatin  (LIPITOR) 10 MG tablet Take 10 mg by mouth in the morning.   Yes [provider]  Lactobacillus Rhamnosus, GG, (RA PROBIOTIC DIGESTIVE CARE) CAPS Take 1 capsule by mouth in the morning.   Yes [provider]  Multiple Vitamin (MULTIVITAMIN WITH MINERALS) TABS tablet Take 1 tablet by mouth daily. Centrum Silver for Men 50+   Yes [provider]  tadalafil (CIALIS) 20 MG tablet Take 20 mg by mouth daily as needed for erectile dysfunction.  02/17/20  Yes [provider]  valsartan  (DIOVAN ) 160 MG tablet TAKE 1 TABLET BY MOUTH EVERY DAY 09/14/24  Yes Patwardhan, Manish J, MD  azithromycin  (ZITHROMAX ) 250 MG tablet Take 1 tablet (250 mg total) by mouth daily. Take first 2 tablets together, then 1 every day until finished. Patient not taking: Reported on 12/02/2024 03/24/23   Elnor Savant LABOR, DO  guaiFENesin  (ROBITUSSIN) 100 MG/5ML liquid Take 5 mLs by mouth every 4 (four) hours as needed for cough or to loosen phlegm. Patient not taking: Reported on 12/02/2024 03/24/23   Elnor,  Samuel A, DO  MAGNESIUM  PO Take by mouth. Patient not taking: Reported on 12/02/2024 09/10/21   [provider]  metoprolol  succinate (TOPROL  XL) 25 MG 24 hr tablet Take 1 tablet (25 mg total) by mouth daily. Patient not taking: Reported on 08/12/2023 02/09/23   Custovic, Sabina, DO    PRN MEDs: acetaminophen  **OR** acetaminophen , bisacodyl , hydrALAZINE , HYDROmorphone  (DILAUDID ) injection, ipratropium, ondansetron  **OR** ondansetron  (ZOFRAN ) IV, oxyCODONE , senna-docusate, sodium phosphate , traZODone   Past Medical History:  Diagnosis Date   Hypercholesteremia     Primary hypertension    Pulmonary embolism (HCC) 08/27/2020   Typical atrial flutter (HCC) 08/27/2020 with PE    Past Surgical History:  Procedure Laterality Date   A-FLUTTER ABLATION N/A 04/21/2022   Procedure: A-FLUTTER ABLATION;  Surgeon: Cindie Ole DASEN, MD;  Location: [facility];  Service: Cardiovascular;  Laterality: N/A;   IR THORACENTESIS RIGHT ASP PLEURAL SPACE W/IMG GUIDE  09/20/2020   LAPAROTOMY     LEFT HEART CATH AND CORONARY ANGIOGRAPHY N/A 09/25/2020   Procedure: LEFT HEART CATH AND CORONARY ANGIOGRAPHY;  Surgeon: Elmira Newman PARAS, MD;  Location: [facility];  Service: Cardiovascular;  Laterality: N/A;     reports that he has been smoking cigars. He has never used smokeless tobacco. He reports current alcohol  use of about 6.0 - 8.0 standard drinks of alcohol  per week. He reports current drug use. Drug: Marijuana.   Family History  Problem Relation Age of Onset   CAD Mother 57   Heart attack Father    Cancer Sister        lung   Heart disease Brother    Heart failure Brother    Heart attack Brother     Physical Exam:   Vitals:   12/02/24 1540 12/02/24 1541 12/02/24 1542 12/02/24 1620  BP:    125/82  Pulse:    97  Resp: (!) 26 (!) 27 (!) 25 18  Temp:    98.2 F (36.8 C)  TempSrc:    Oral  SpO2:    100%  Weight:      Height:       Constitutional: NAD, calm, comfortable Eyes: PERRL, lids and conjunctivae normal ENMT: Mucous membranes are moist. Posterior pharynx clear of any exudate or lesions.Normal dentition.  Neck: normal, supple, no masses, no thyromegaly Respiratory: clear to auscultation bilaterally, no wheezing, no crackles. Normal respiratory effort. No accessory muscle use.  Cardiovascular: Irregularly irregular/ rubs / gallops. +2 extremity edema. 2+ pedal pulses. No carotid bruits.  Abdomen: no tenderness, no masses palpated. No hepatosplenomegaly. Bowel sounds positive.  Musculoskeletal: no clubbing / cyanosis. No joint  deformity upper and lower extremities. Good ROM, no contractures. Normal muscle tone.  Neurologic: CN II-XII grossly intact. Sensation intact, DTR normal. Strength 5/5 in all 4.  Psychiatric: Normal judgment and insight. Alert and oriented x 3. Normal mood.  Skin: no rashes, lesions, ulcers. No induration    Labs on admission:    I have personally reviewed following labs and imaging studies  CBC: Recent Labs  Lab 12/02/24 1427  WBC 6.6  NEUTROABS 4.4  HGB 14.0  HCT 42.1  MCV 102.4*  PLT 148*   Basic Metabolic Panel: Recent Labs  Lab 12/02/24 1427  NA 141  K 4.4  CL 108  CO2 22  GLUCOSE 81  BUN 13  CREATININE 0.83  CALCIUM  9.1  MG 1.8   GFR: Estimated Creatinine Clearance: 105.9 mL/min (by C-G formula based on SCr of 0.83 mg/dL). Liver Function  Tests: Recent Labs  Lab 12/02/24 1427  AST 30  ALT 25  ALKPHOS 71  BILITOT 1.0  PROT 6.0*  ALBUMIN 3.4*    BNP (last 3 results) Recent Labs    12/02/24 1427  PROBNP 232.0    Urine analysis:    Component Value Date/Time   COLORURINE YELLOW 09/19/2020 1133   APPEARANCEUR CLEAR 09/19/2020 1133   LABSPEC 1.017 09/19/2020 1133   PHURINE 5.0 09/19/2020 1133   GLUCOSEU NEGATIVE 09/19/2020 1133   HGBUR SMALL (A) 09/19/2020 1133   BILIRUBINUR NEGATIVE 09/19/2020 1133   KETONESUR NEGATIVE 09/19/2020 1133   PROTEINUR NEGATIVE 09/19/2020 1133   NITRITE NEGATIVE 09/19/2020 1133   LEUKOCYTESUR NEGATIVE 09/19/2020 1133    Last A1C:  Lab Results  Component Value Date   HGBA1C 5.9 (H) 12/05/2021     Radiologic Exams on Admission:   DG Chest Port 1 View Result Date: 12/02/2024 CLINICAL DATA:  Shortness of breath EXAM: PORTABLE CHEST 1 VIEW COMPARISON:  03/23/2023 FINDINGS: Mildly degraded exam due to AP portable technique and patient body habitus. Numerous leads and wires project over the chest. Apical lordotic positioning. Midline trachea. Cardiomegaly accentuated by AP portable technique. No pleural effusion or  pneumothorax. Suspect subsegmental atelectasis at both lung bases. No congestive failure. IMPRESSION: Cardiomegaly and low lung volumes. No acute findings. Electronically Signed   By: Rockey Kilts M.D.   On: 12/02/2024 14:54    EKG:   Independently reviewed.  Orders placed or performed during the hospital encounter of 12/02/24   EKG 12-Lead   EKG 12-Lead   EKG 12-Lead   ---------------------------------------------------------------------------------------------------------------------------------------    Assessment / Plan:   Principal Problem:   Atrial fibrillation with rapid ventricular response (HCC) Active Problems:   Essential hypertension   Dyslipidemia   Acute systolic CHF (congestive heart failure) (HCC)   Acute pulmonary embolism (HCC)   Gout   Assessment and Plan: * Atrial fibrillation with rapid ventricular response (HCC) On arrival heart rate> 150 -EKG reviewed: A-fib, with rate of 111, QTc 495, lateral T wave abnormalities, negative any ST elevation or depression - Cardizem  drip started in in ED, will continue -Since the start of Cardizem  drip heart rate improving  - Patient reports that he has not been on Eliquis  since 2021, restarting Eliquis  CHA2DS2-VASc score > 3 - Patient reports he has not been taking his metoprolol , also restarting it  -Cardiology consulted, appreciate further evaluation recommendation (History of cardiac cath, and appellation in 2021)  -Last echo reviewed: From January 2023, EF 60-65%, moderate concentric LVH, - Repeat echocardiogram - Trending troponin 14  - Repeat EKG as needed - Monitoring electrolytes potassium > 4.0 , magnesium  >2.0  Replating magnesium     Acute systolic CHF (congestive heart failure) (HCC) HFpEF - No signs of volume overload, no resp distress -No significant lower extremity edema -Chest x-ray negative for any congestions -BNP at 232.0  - As needed diuretics -Doctoring Daily weights, I's and  O's -Last echo reviewed: 1/ 2023, EF 60-65%, moderate concentric LVH,  - Repeating echo  Dyslipidemia Continue atorvastatin   Essential hypertension - Monitoring blood pressure on Cardizem  drip -Continue metoprolol , confirm home medication and restart meds according -post Cardizem  drip  Gout No signs of exacerbation -As needed colchicine  Acute pulmonary embolism (HCC) Remote history of PE in 2021 - Per patient hematologist has taken him off Eliquis  in 2021 (It was not confirmed PE)       Consults called: Cardiology -------------------------------------------------------------------------------------------------------------------------------------------- DVT prophylaxis:  SCDs Start: 12/02/24 1631 Place  TED hose Start: 12/02/24 1631 apixaban  (ELIQUIS ) tablet 5 mg   Code Status:   Code Status: Full Code   Admission status: Patient will be admitted as Observation, with a greater than 2 midnight length of stay. Level of care: Telemetry   Family Communication:  none at bedside  (The above findings and plan of care has been discussed with patient in detail, the patient expressed understanding and agreement of above plan)  --------------------------------------------------------------------------------------------------------------------------------------------------  Disposition Plan:  Anticipated 1-2 days Status is: Observation The patient remains OBS appropriate and will d/c before 2 midnights.     ----------------------------------------------------------------------------------------------------------------------------------------------------  Time spent:  58  Min.  Was spent seeing and evaluating the patient, reviewing all medical records, drawn plan of care.  SIGNED: Adriana DELENA Grams, MD, FHM. FAAFP. [facility], Pager  (Please use [email or web address] to page/ or secure chat through epic) If 7PM-7AM, please contact night-coverage [email or web address],   12/02/2024, 5:14 PM     [1] No Known Allergies  "

## 2024-12-02 NOTE — Hospital Course (Addendum)
 Carl [name] was admitted to the hospital with the working diagnosis of atrial fibrillation.   69 year old male past medical history of paroxysmal atrial fibrillation, hypertension, hyperlipidemia, ,COPD, heart failure, obesity and pulmonary embolism who presented with dyspnea.  Reported progressive dyspnea, fatigue, generalized weakness, palpitation and weight gain for past 2 weeks prior to admission. Because persistent symptoms EMS was called, patient was found in atrial fibrillation with rapid ventricular response, 150 to 200 bpm, with blood pressure systolic of 122 mmHg. He had 35 mg of diltiazem  with no response than he was transported to the ED.  On his initial physical examination in the ED his blood pressure was 125/82, RR 27, HR 150 and 02 saturation 98% Lungs with no wheezing or rhonchi, heart with S1 and S2 present and tachycardic, irregularly irregular, abdomen with no distention and positive lower extremity edema ++  Na 141, K 4.4 Cl 108 bicarbonate 22, glucose 81 bun 13 cr 0,83  Mg 1,8  Pro BNP 232  High sensitive troponin 14 and 13  Wbc 6.6 hgb 14.0 plt 148  TSH 1,56   Chest radiograph with hypoinflation, positive cardiomegaly, mild bilateral hilar vascular congestion, no effusions or infiltrates.   EKG 111 bpm, right axis deviation, right bundle branch block, qtc 495, atrial flutter rhythm with variable block, no significant ST segment or  T wave changes.   Patient was placed on diltiazem  infusion for rate control, 01/25 continue with rapid ventricular response, plan for direct current cardioversion tomorrow  01/26 plan for cardioversion today (direct current)

## 2024-12-02 NOTE — ED Provider Notes (Signed)
 Pt signed out by Dr. Emil pending lab work and admission.  CBC nl other than plt low at 148 (nl on 03/23/23) CMP nl Mg nl Trop nl BNP nl  Pt d/w Dr. Willette ([facility]) for admission.  CHA2DS2/VAS Stroke Risk Points  Current as of 21 minutes ago     4 >= 2 Points: High Risk  1 to 1.99 Points: Medium Risk  0 Points: Low Risk    Last Change: N/A      Details    This score determines the patient's risk of having a stroke if the  patient has atrial fibrillation.       Points Metrics  1 Has Congestive Heart Failure:  Yes    Current as of 21 minutes ago  1 Has Vascular Disease:  Yes    Current as of 21 minutes ago  1 Has Hypertension:  Yes    Current as of 21 minutes ago  1 Age:  69    Current as of 21 minutes ago  0 Has Diabetes Excluding Gestational Diabetes:  No    Current as of 21 minutes ago  0 Had Stroke:  No  Had TIA:  No  Had Thromboembolism:  No    Current as of 21 minutes ago  0 Male:  No    Current as of 21 minutes ago       Pt given Eliquis .        Dean Clarity, MD 12/02/24 1637

## 2024-12-02 NOTE — Assessment & Plan Note (Deleted)
 On arrival heart rate> 150 -EKG reviewed: A-fib, with rate of 111, QTc 495, lateral T wave abnormalities, negative any ST elevation or depression - Cardizem  drip started in in ED, will continue -Since the start of Cardizem  drip heart rate improving -Confirming resuming home medication of metoprolol , Eliquis   -Cardiology consulted, appreciate further evaluation recommendation (History of cardiac cath, and appellation in 2021) -Last echo reviewed: From January 2023, EF 60-65%, moderate concentric LVH, - Repeat echocardiogram - Trending troponin - Repeat EKG as needed - Monitoring electrolytes potassium > 4.0 , magnesium  >2.0  Replating magnesium 

## 2024-12-02 NOTE — Consult Note (Signed)
 "  Cardiology Consultation   Patient ID: Carl [name] MRN: [id number]; DOB: 10/26/1956  Admit date: 12/02/2024 Date of Consult: 12/02/2024  PCP:  Trudy Elodia PARAS, PA-C   [facility] Providers Cardiologist:  None  Electrophysiologist:  OLE ONEIDA HOLTS, MD (Inactive)       Patient Profile: Carl [name] is a 69 y.o. male with a hx of hypertension, hyperlipidemia, COPD, HFimpEF, and AFL s/p ablation who is being seen 12/02/2024 for the evaluation of AF at the request of Mliss Boyers MD.  History of Present Illness: Mr. Culbreath has a history of tachy-mediated HFrEF, in 2021 EF 20-25%. A LHC was pursued which showed no CAD. He underwent DCCV and then ultimately s/p ablation in 2023 with Dr. Holts. Follow-up echocardiogram showed EF 60-65%. He was last seen by Dr. Elmira 08/2023. At that time he reported one episode of rapid heart beat though self-limited. Occurred after an episode of excessive alcohol , he was advised to decreased alcohol  intake. Given low burden is not on chronic anticoagulation.   Presented to the ED today for AF, called EMS took prn cardizem  35 mg without positive effect.Given IV fluids in route.  In the ED: BP:118/91 ECG: AFL with variable conduction, abnormal r wave progression and extreme axis deviation VR 111 CXR showed cardiomegaly with low lung volumes  Pertinent lab work Normal renal function. K 4.4   Mag 1.8 ProBNP 232 Troponin 14 PLT 148  Started on IV cardizem , eliquis , and has PTA metoprolol  scheduled this evening.   On interview, the patient reports that he was in his usual state of health until 2 weeks ago when he started feeling very fatigued.  Did not have any chest pain or SOB at the time just generally fatigued.  He subsequently developed progressive lower extremity edema which subsequently became associated with DOE.  The symptoms are reminiscent of his prior atrial flutter which was previously treated with ablation.  He  presented to his PCPs office for evaluation and was found to be back in atrial flutter with RVR and was sent to the ED for evaluation.  Past Medical History:  Diagnosis Date   Hypercholesteremia    Primary hypertension    Pulmonary embolism (HCC) 08/27/2020   Typical atrial flutter (HCC) 08/27/2020 with PE    Past Surgical History:  Procedure Laterality Date   A-FLUTTER ABLATION N/A 04/21/2022   Procedure: A-FLUTTER ABLATION;  Surgeon: Holts Ole ONEIDA, MD;  Location: [facility] INVASIVE CV LAB;  Service: Cardiovascular;  Laterality: N/A;   IR THORACENTESIS RIGHT ASP PLEURAL SPACE W/IMG GUIDE  09/20/2020   LAPAROTOMY     LEFT HEART CATH AND CORONARY ANGIOGRAPHY N/A 09/25/2020   Procedure: LEFT HEART CATH AND CORONARY ANGIOGRAPHY;  Surgeon: Elmira Newman PARAS, MD;  Location: [facility] INVASIVE CV LAB;  Service: Cardiovascular;  Laterality: N/A;     Home Medications:  Prior to Admission medications  Medication Sig Start Date End Date Taking? Authorizing Provider  albuterol  (VENTOLIN  HFA) 108 (90 Base) MCG/ACT inhaler Inhale 1-2 puffs into the lungs every 6 (six) hours as needed for wheezing or shortness of breath. 03/24/23  Yes Elnor Jayson LABOR, DO  allopurinol  (ZYLOPRIM ) 100 MG tablet Take 100 mg by mouth in the morning.  02/20/20  Yes [provider]  atorvastatin  (LIPITOR) 10 MG tablet Take 10 mg by mouth in the morning.   Yes [provider]  Lactobacillus Rhamnosus, GG, (RA PROBIOTIC DIGESTIVE CARE) CAPS Take 1 capsule by mouth in the morning.   Yes [provider]  Multiple Vitamin (MULTIVITAMIN WITH MINERALS) TABS tablet Take 1 tablet by mouth daily. Centrum Silver for Men 50+   Yes [provider]  tadalafil (CIALIS) 20 MG tablet Take 20 mg by mouth daily as needed for erectile dysfunction.  02/17/20  Yes [provider]  valsartan  (DIOVAN ) 160 MG tablet TAKE 1 TABLET BY MOUTH EVERY DAY 09/14/24  Yes Patwardhan, Manish J, MD  azithromycin  (ZITHROMAX ) 250  MG tablet Take 1 tablet (250 mg total) by mouth daily. Take first 2 tablets together, then 1 every day until finished. Patient not taking: Reported on 12/02/2024 03/24/23   Elnor Jayson LABOR, DO  guaiFENesin  (ROBITUSSIN) 100 MG/5ML liquid Take 5 mLs by mouth every 4 (four) hours as needed for cough or to loosen phlegm. Patient not taking: Reported on 12/02/2024 03/24/23   Elnor Jayson A, DO  MAGNESIUM  PO Take by mouth. Patient not taking: Reported on 12/02/2024 09/10/21   [provider]  metoprolol  succinate (TOPROL  XL) 25 MG 24 hr tablet Take 1 tablet (25 mg total) by mouth daily. Patient not taking: Reported on 08/12/2023 02/09/23   Custovic, Sabina, DO    Scheduled Meds:  apixaban   5 mg Oral BID   Continuous Infusions:  diltiazem  (CARDIZEM ) infusion 5 mg/hr (12/02/24 1525)   PRN Meds:   Allergies:   Allergies[1]  Social History:   Social History   Socioeconomic History   Marital status: Widowed    Spouse name: Not on file   Number of children: 2   Years of education: Not on file   Highest education level: Not on file  Occupational History   Occupation: retired  Tobacco Use   Smoking status: Some Days    Types: Cigars   Smokeless tobacco: Never   Tobacco comments:    Once a week 05/08/22  Vaping Use   Vaping status: Never Used  Substance and Sexual Activity   Alcohol  use: Yes    Alcohol /week: 6.0 - 8.0 standard drinks of alcohol     Types: 6 - 8 Glasses of wine per week    Comment: 3-4 glasses of wine twice a week 05/08/22   Drug use: Yes    Types: Marijuana    Comment: 1 edible nightly 05/08/22   Sexual activity: Not on file  Other Topics Concern   Not on file  Social History Narrative   Not on file   Social Drivers of Health   Tobacco Use: High Risk (12/02/2024)   Received from [facility]   Patient History    Smoking Tobacco Use: Some Days    Smokeless Tobacco Use: Never    Passive Exposure: Not on file  Financial Resource Strain: Not on file  Food  Insecurity: Low Risk (09/05/2024)   Received from [facility]   Epic    Within the past 12 months, you worried that your food would run out before you got money to buy more: Never true    Within the past 12 months, the food you bought just didn't last and you didn't have money to get more. : Never true  Transportation Needs: No Transportation Needs (09/05/2024)   Received from [facility]    In the past 12 months, has lack of reliable transportation kept you from medical appointments, meetings, work or from getting things needed for daily living? : No  Physical Activity: Not on file  Stress: Not on file  Social Connections: Not on file  Intimate Partner Violence: Not on file  Depression (PHQ2-9): Not on file  Alcohol  Screen: Not on file  Housing: Low Risk (09/05/2024)   Received from [facility]   Epic    What is your living situation today?: I have a steady place to live    Think about the place you live. Do you have problems with any of the following? Choose all that apply:: None/None on this list  Utilities: Low Risk (09/05/2024)   Received from [facility]   Utilities    In the past 12 months has the electric, gas, oil, or water company threatened to shut off services in your home? : No  Health Literacy: Not on file    Family History:   Family History  Problem Relation Age of Onset   CAD Mother 94   Heart attack Father    Cancer Sister        lung   Heart disease Brother    Heart failure Brother    Heart attack Brother      ROS:  Please see the history of present illness.  All other ROS reviewed and negative.     Physical Exam/Data: Vitals:   12/02/24 1539 12/02/24 1540 12/02/24 1541 12/02/24 1542  BP:      Pulse:      Resp: (!) 26 (!) 26 (!) 27 (!) 25  Temp:      TempSrc:      SpO2:      Weight:      Height:       No intake or output data in the 24 hours ending 12/02/24 1616    12/02/2024    1:20 PM 08/12/2023    9:38 AM 03/23/2023    10:20 PM  Last 3 Weights  Weight (lbs) 243 lb 240 lb 242 lb 8.1 oz  Weight (kg) 110.224 kg 108.863 kg 110 kg     Body mass index is 34.87 kg/m.  General:  Well nourished, well developed, in no acute distress, very pleasant HEENT: normal Neck: JVD 2 cm above clavicle at 45 degrees Vascular: No carotid bruits; Distal pulses 2+ bilaterally Cardiac:  normal S1, S2; tachycardic with irregularly irregular rhythm, no M/R/G Lungs: Bibasilar rales, no wheezing or rhonchi Abd: soft, nontender, no hepatomegaly  Ext: 2+ pitting edema to shins bilaterally Musculoskeletal:  No deformities, BUE and BLE strength normal and equal Skin: warm and dry  Neuro:  CNs 2-12 intact, no focal abnormalities noted Psych:  Normal affect   EKG:  The EKG was personally reviewed and demonstrates: Atrial flutter with RVR and variable block Telemetry:  Telemetry was personally reviewed and demonstrates: Atrial flutter with RVR variable block  Relevant CV Studies: Echocardiogram 11/2021 IMPRESSIONS     1. Left ventricular ejection fraction, by estimation, is 60 to 65%. The  left ventricle has normal function. The left ventricle has no regional  wall motion abnormalities. There is moderate concentric left ventricular  hypertrophy. Left ventricular  diastolic parameters are indeterminate.   2. Right ventricular systolic function is normal. The right ventricular  size is normal. Tricuspid regurgitation signal is inadequate for assessing  PA pressure.   3. The mitral valve is normal in structure. No evidence of mitral valve  regurgitation. No evidence of mitral stenosis.   4. The aortic valve is normal in structure. Aortic valve regurgitation is  not visualized. No aortic stenosis is present.   5. Aortic dilatation noted. There is mild dilatation of the aortic root,  measuring 38 mm. There is mild dilatation of the  ascending aorta,  measuring 40 mm.   6. The inferior vena cava is normal in size with greater  than 50%  respiratory variability, suggesting right atrial pressure of 3 mmHg.   LHC 09/2020 LM: Normal LAD: Type 1 LAD that does not reach apex          Medial calcification without any luminal stenosis         Large Diag 1 that reaches apex and also gives off septal perforators, making it a dual LAD system          No other diagonals present LCx: Normal Ramus: Normal RCA: Large, gives of several septal perforators, reaches apex         Minimal luminal irregularities   No significant coronary artery disease   I discussed with the patient re: management of paroxysmal Afib, as well prior h/o atrial flutter. Cardioversion unlikely to help, as he has been in paroxysmal Afib. His EF dropped from normal to 25-30% while in Afib, making rhythm control therapy important. I discussed AAD vs ablation. He wants to avoid being on pills and would prefer consultation re: ablation. Will arrange this outpatient. Continue anticoagulation with eliquis  and rate control with metoprolol  succinate 50 mg daily. Stop amiodarone  as it has not been able to maintain sinus rhythm anyway. Stop diltiazem  to avoid negative inotropy.    Laboratory Data: High Sensitivity Troponin:   Recent Labs  Lab 12/02/24 1427  TRNPT 14      Chemistry Recent Labs  Lab 12/02/24 1427  NA 141  K 4.4  CL 108  CO2 22  GLUCOSE 81  BUN 13  CREATININE 0.83  CALCIUM  9.1  MG 1.8  GFRNONAA >60  ANIONGAP 11    Recent Labs  Lab 12/02/24 1427  PROT 6.0*  ALBUMIN 3.4*  AST 30  ALT 25  ALKPHOS 71  BILITOT 1.0   Hematology Recent Labs  Lab 12/02/24 1427  WBC 6.6  RBC 4.11*  HGB 14.0  HCT 42.1  MCV 102.4*  MCH 34.1*  MCHC 33.3  RDW 13.9  PLT 148*   BNP Recent Labs  Lab 12/02/24 1427  PROBNP 232.0     Radiology/Studies:  DG Chest Port 1 View Result Date: 12/02/2024 CLINICAL DATA:  Shortness of breath EXAM: PORTABLE CHEST 1 VIEW COMPARISON:  03/23/2023 FINDINGS: Mildly degraded exam due to AP portable  technique and patient body habitus. Numerous leads and wires project over the chest. Apical lordotic positioning. Midline trachea. Cardiomegaly accentuated by AP portable technique. No pleural effusion or pneumothorax. Suspect subsegmental atelectasis at both lung bases. No congestive failure. IMPRESSION: Cardiomegaly and low lung volumes. No acute findings. Electronically Signed   By: Rockey Kilts M.D.   On: 12/02/2024 14:54     Assessment and Plan:  #Aflutter w/ RVR s/p Ablation (2023) - Patient with prior history of atrial flutter s/p ablation now with recurrence of atrial flutter with RVR. 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 - His CHA2DS2-VASc score is 3 which necessitates long-term OAC especially now that he has recurrence. - No clear trigger for the recurrence of his atrial flutter at this time. - For the time being we will rate control and anticipate a TEE plus DCCV if he does not convert to NSR on his own. Start Eliquis  5 mg twice daily I performed a bedside POCUS  which demonstrates a mild to moderately reduced LVEF so we will discontinue diltiazem  at this time. 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 - The only medication he was taking for GDMT was valsartan  and and he shared with me that metoprolol  made him  feel weird so he stopped taking it. - I performed a bedside POCUS which demonstrates a reduction in his LVEF consistent with his clinical picture of CHF. - I think the patient would benefit from full GDMT moving forward even though his CHF is likely tachycardia mediated. Give Lasix  60 mg  IV Starting metoprolol  as above Will initiate additional GDMT pending observed hemodynamic tolerance of metoprolol  Complete echocardiogram Strict I's and O's Daily weights   #Hypertension -Treatment of BP by the way of treatment of CHF  #Hyperlipidemia Lipid panel in the am Continue lipitor 10 mg  Otherwise per primary   Risk Assessment/Risk Scores:       [place]  Heart Association (NYHA) Functional Class NYHA Class IV  CHA2DS2-VASc Score = 3   This indicates a 3.2% annual risk of stroke. The patient's score is based upon: CHF History: 1 HTN History: 1 Diabetes History: 0 Stroke History: 0 Vascular Disease History: 0 Age Score: 1 Gender Score: 0        For questions or updates, please contact [facility] Please consult [email or web address] for contact info under      Signed, Leontine LOISE Salen, PA-C  12/02/2024 4:16 PM     [1] No Known Allergies  "

## 2024-12-02 NOTE — Assessment & Plan Note (Signed)
 Continue atorvastatin  Continue atorvastatin .

## 2024-12-02 NOTE — Assessment & Plan Note (Deleted)
 HFpEF - No signs of volume overload, no resp distress -No significant lower extremity edema -Chest x-ray negative for any congestions -BNP at 232.0  - As needed diuretics -Doctoring Daily weights, I's and O's -Last echo reviewed: 1/ 2023, EF 60-65%, moderate concentric LVH,  - Repeating echo

## 2024-12-02 NOTE — Assessment & Plan Note (Addendum)
 No acute attack Continue allopurinol 

## 2024-12-02 NOTE — ED Notes (Signed)
 Pt in gown - all clothes off in chair beside in

## 2024-12-02 NOTE — ED Notes (Signed)
 Pharmacy asked to send missing dosage of loading Cardizem .

## 2024-12-02 NOTE — Assessment & Plan Note (Addendum)
 Remote history of PE in 2021 Continue anticoagulation with apixaban 

## 2024-12-02 NOTE — ED Notes (Signed)
 Patient belongings went with pt from bed 29 to bed 38

## 2024-12-02 NOTE — ED Provider Notes (Signed)
 " [place] EMERGENCY DEPARTMENT AT [facility] Provider Note   CSN: [id number] Arrival date & time: 12/02/24  1253     Patient presents with: No chief complaint on file.   Carl [name] is a 69 y.o. male.   69 yo M with a chief complaints of fatigue.  Going on for at least 2 weeks.  He felt worse actually at the onset.  He was not getting back to his normal though.  Short of breath specially with exertion.  Developed some lower extremity edema and went to his family doctor today for checkup.  Found to be in atrial fibrillation with rapid ventricular response.  Patient has a history of A-fib in the past.  Was on Eliquis .          Prior to Admission medications  Medication Sig Start Date End Date Taking? Authorizing Provider  albuterol  (VENTOLIN  HFA) 108 (90 Base) MCG/ACT inhaler Inhale 1-2 puffs into the lungs every 6 (six) hours as needed for wheezing or shortness of breath. 03/24/23  Yes Elnor Savant A, DO  allopurinol  (ZYLOPRIM ) 100 MG tablet Take 100 mg by mouth in the morning.  02/20/20  Yes [provider]  atorvastatin  (LIPITOR) 10 MG tablet Take 10 mg by mouth in the morning.   Yes [provider]  Lactobacillus Rhamnosus, GG, (RA PROBIOTIC DIGESTIVE CARE) CAPS Take 1 capsule by mouth in the morning.   Yes [provider]  Multiple Vitamin (MULTIVITAMIN WITH MINERALS) TABS tablet Take 1 tablet by mouth daily. Centrum Silver for Men 50+   Yes [provider]  tadalafil (CIALIS) 20 MG tablet Take 20 mg by mouth daily as needed for erectile dysfunction.  02/17/20  Yes [provider]  valsartan  (DIOVAN ) 160 MG tablet TAKE 1 TABLET BY MOUTH EVERY DAY 09/14/24  Yes Patwardhan, Manish J, MD  azithromycin  (ZITHROMAX ) 250 MG tablet Take 1 tablet (250 mg total) by mouth daily. Take first 2 tablets together, then 1 every day until finished. Patient not taking: Reported on 12/02/2024 03/24/23   Elnor Savant LABOR, DO  guaiFENesin  (ROBITUSSIN)  100 MG/5ML liquid Take 5 mLs by mouth every 4 (four) hours as needed for cough or to loosen phlegm. Patient not taking: Reported on 12/02/2024 03/24/23   Elnor Savant A, DO  MAGNESIUM  PO Take by mouth. Patient not taking: Reported on 12/02/2024 09/10/21   [provider]  metoprolol  succinate (TOPROL  XL) 25 MG 24 hr tablet Take 1 tablet (25 mg total) by mouth daily. Patient not taking: Reported on 08/12/2023 02/09/23   Custovic, Sabina, DO    Allergies: Patient has no known allergies.    Review of Systems  Updated Vital Signs BP (!) 118/91   Pulse (!) 57   Temp 97.7 F (36.5 C) (Oral)   Resp (!) 24   Ht 5' 10 (1.778 m)   Wt 110.2 kg   SpO2 96%   BMI 34.87 kg/m   Physical Exam Vitals and nursing note reviewed.  Constitutional:      Appearance: He is well-developed.  HENT:     Head: Normocephalic and atraumatic.  Eyes:     Pupils: Pupils are equal, round, and reactive to light.  Neck:     Vascular: No JVD.  Cardiovascular:     Rate and Rhythm: Tachycardia present. Rhythm irregular.     Heart sounds: No murmur heard.    No friction rub. No gallop.  Pulmonary:     Effort: No respiratory distress.  Breath sounds: No wheezing.  Abdominal:     General: There is no distension.     Tenderness: There is no abdominal tenderness. There is no guarding or rebound.  Musculoskeletal:        General: Normal range of motion.     Cervical back: Normal range of motion and neck supple.     Right lower leg: Edema present.     Left lower leg: Edema present.     Comments: 1-2+ lower extremity edema up to the shins  Skin:    Coloration: Skin is not pale.     Findings: No rash.  Neurological:     Mental Status: He is alert and oriented to person, place, and time.  Psychiatric:        Behavior: Behavior normal.     (all labs ordered are listed, but only abnormal results are displayed) Labs Reviewed  CBC WITH DIFFERENTIAL/PLATELET  COMPREHENSIVE METABOLIC PANEL WITH GFR  PRO  BRAIN NATRIURETIC PEPTIDE  MAGNESIUM   TROPONIN T, HIGH SENSITIVITY    EKG: EKG Interpretation Date/Time:  Friday December 02 2024 13:12:23 EST Ventricular Rate:  111 PR Interval:    QRS Duration:  90 QT Interval:  364 QTC Calculation: 495 R Axis:   239  Text Interpretation: Atrial fibrillation Markedly posterior QRS axis Borderline T abnormalities, lateral leads Borderline prolonged QT interval Otherwise no significant change Confirmed by Emil Share [phone]) on 12/02/2024 1:37:15 PM  Radiology: [facility] Chest Port 1 View Result Date: 12/02/2024 CLINICAL DATA:  Shortness of breath EXAM: PORTABLE CHEST 1 VIEW COMPARISON:  03/23/2023 FINDINGS: Mildly degraded exam due to AP portable technique and patient body habitus. Numerous leads and wires project over the chest. Apical lordotic positioning. Midline trachea. Cardiomegaly accentuated by AP portable technique. No pleural effusion or pneumothorax. Suspect subsegmental atelectasis at both lung bases. No congestive failure. IMPRESSION: Cardiomegaly and low lung volumes. No acute findings. Electronically Signed   By: Rockey Kilts M.D.   On: 12/02/2024 14:54     .Critical Care  Performed by: Emil Share, DO Authorized by: Emil Share, DO   Critical care provider statement:    Critical care time (minutes):  35   Critical care time was exclusive of:  Separately billable procedures and treating other patients   Critical care was time spent personally by me on the following activities:  Development of treatment plan with patient or surrogate, discussions with consultants, evaluation of patient's response to treatment, examination of patient, ordering and review of laboratory studies, ordering and review of radiographic studies, ordering and performing treatments and interventions, pulse oximetry, re-evaluation of patient's condition and review of old charts   Care discussed with: admitting provider      Medications Ordered in the ED  diltiazem   (CARDIZEM ) 1 mg/mL load via infusion 20 mg (has no administration in time range)    And  diltiazem  (CARDIZEM ) 125 mg in dextrose  5% 125 mL (1 mg/mL) infusion (has no administration in time range)  apixaban  (ELIQUIS ) tablet 5 mg (has no administration in time range)                                    Medical Decision Making Amount and/or Complexity of Data Reviewed Labs: ordered. Radiology: ordered.  Risk Prescription drug management.   69 yo M with a chief complaints of fatigue.  Going on for at least a couple weeks.  He said it was worse  at the onset but has not resolved.  He denies cough congestion or fever.  Developed some lower extremity edema.  He went to his family doctor's office today and found to be in atrial fibrillation with rapid ventricular response.  He has a history of A-fib in the past.  Was on Eliquis  for about 6 months and was taken off.  He denies any obvious infectious symptoms.  Will check electrolytes here.  Given a bolus dose of diltiazem  en route with EMS.  I discussed case with Dr. Loni cardiology, she did not feel that the presence of lower extremity edema would make it so that any medication would be restricted.  I will start the patient on a diltiazem  infusion.  Cardiology to see at bedside.  Awaiting blood work.  Will require admission.  Patient care signed out to Dr. Dean, please see their note for further details of care in the ED.  The patients results and plan were reviewed and discussed.   Any x-rays performed were independently reviewed by myself.   Differential diagnosis were considered with the presenting HPI.  Medications  diltiazem  (CARDIZEM ) 1 mg/mL load via infusion 20 mg (has no administration in time range)    And  diltiazem  (CARDIZEM ) 125 mg in dextrose  5% 125 mL (1 mg/mL) infusion (has no administration in time range)  apixaban  (ELIQUIS ) tablet 5 mg (has no administration in time range)    Vitals:   12/02/24 1315 12/02/24  1320 12/02/24 1321 12/02/24 1329  BP:      Pulse: (!) 44   (!) 57  Resp: (!) 29   (!) 24  Temp:      TempSrc:      SpO2: 96%  94% 96%  Weight:  110.2 kg    Height:  5' 10 (1.778 m)      Final diagnoses:  Atrial fibrillation with rapid ventricular response (HCC)        Final diagnoses:  Atrial fibrillation with rapid ventricular response [facility])    ED Discharge Orders     None          Emil Share, DO 12/02/24 1501  "

## 2024-12-02 NOTE — Progress Notes (Signed)
 [facility] [place]  Return Patient Visit RODOLPH HAGEMANN DOB: 1956-04-07  MRN: [id number] Visit Date: 12/02/2024  Encounter Provider: Laneta Tanda Agent, PA-C  Subjective Carl [name] is a 69 y.o. male with PMH hypertension, HLD, COPD, atrial flutter s/p ablation, and CHF (diagnosis noted on chart review, but unable to view notes elaborating on diagnosis or management of CHF) who presents for shortness of breath, fatigue, and 10 pound weight gain. History of Present Illness He has been experiencing shortness of breath for approximately 3 weeks, which was initially severe to the point where he was unable to walk to the bathroom without feeling fatigued. This led to an increase in his sleep duration to 11 hours per day and a decrease in his appetite. He also noticed weight gain due to fluid retention, prompting him to reduce his water intake from 3 to 4 bottles per day to just sips throughout the day. Concurrently, he has been dealing with leg swelling and fatigue for the same duration, although these symptoms have shown some improvement. He has not consumed alcohol  for the past 2 to 3 weeks. His current medication regimen includes valsartan  160 mg daily, but he has discontinued metoprolol  succinate 25 mg daily. He does not monitor his blood pressure at home but has observed a decrease in his resting heart rate from the typical 78 to 80s to the 40s and 50s. He uses an oximeter for additional monitoring. He owns compression socks but only wears them during the Christmas season. He has a history of atrial flutter and underwent cardioversion in 2021. He was previously on spironolactone , which he believes he still has at home. He has not consulted a cardiologist recently.  He reports orthostatic lightheadedness, a symptom he has experienced since childhood.  He was previously diagnosed with a pulmonary embolism and was treated with Eliquis  for 6 months before it was  discontinued.  Social History: Occupation: Woodworker Alcohol : He has not consumed alcohol  for the past 2 to 3 weeks. Sleep: He has been sleeping 11 hours per day off and on.  PAST SURGICAL HISTORY: Cardioversion in 2021  Objective Blood pressure 142/87, pulse 103, temperature 98 F (36.7 C), resp. rate 18, weight 112 kg (246 lb 6.4 oz), SpO2 95%. Physical Exam   Physical Exam Constitutional:      General: He is not in acute distress.    Appearance: Normal appearance. He is not ill-appearing or toxic-appearing.  HENT:     Head: Normocephalic and atraumatic.     Right Ear: External ear normal.     Left Ear: External ear normal.     Nose: Nose normal.  Eyes:     General:        Right eye: No discharge.        Left eye: No discharge.  Cardiovascular:     Rate and Rhythm: Normal rate. Rhythm irregular.     Heart sounds: Normal heart sounds. No murmur heard.    No friction rub. No gallop.  Pulmonary:     Effort: Pulmonary effort is normal. No respiratory distress.     Breath sounds: Normal breath sounds. No stridor. No wheezing, rhonchi or rales.  Musculoskeletal:     Cervical back: Normal range of motion.     Right lower leg: 1+ Pitting Edema present.     Left lower leg: 1+ Pitting Edema present.  Skin:    General: Skin is warm and dry.     Capillary Refill: Capillary refill takes less  than 2 seconds.  Neurological:     General: No focal deficit present.     Mental Status: He is alert and oriented to person, place, and time. Mental status is at baseline.  Psychiatric:        Mood and Affect: Mood normal.        Behavior: Behavior normal.        Thought Content: Thought content normal.        Judgment: Judgment normal.     Medical History: Medical History[1]  Patient Active Problem List   Diagnosis Date Noted   Dyspnea 03/22/2021   Hypokalemia 03/22/2021   Paroxysmal atrial fibrillation    (CMD) 03/22/2021   Alcohol  abuse 09/26/2020   Acute systolic CHF  (congestive heart failure)    (CMD) 09/19/2020   Class 1 obesity 09/19/2020   Gout 09/19/2020   Typical atrial flutter    (CMD) 08/27/2020   Primary hypertension 03/13/2017   Pure hypercholesterolemia 03/13/2017   Chronic gout of foot 03/13/2017   Reactive airway disease with wheezing 12/14/2011   Current Medications:  Medications Ordered Prior to Encounter[2] Current Medications[3]  Allergies: Allergies[4]   Immunizations:  Immunization History  Administered Date(s) Administered   Influenza, Unspecified 11/01/2011, 08/05/2013   TD vaccine (ADULT)PF(TENIVAC) 7Y+ 12/27/2018    Surgical History- Surgical History[5]  Family history- Family History[6] Social history- Social History[7]   Labs: No results found for this or any previous visit (from the past week).    Assessment & Plan 1. Shortness of breath (Primary) 2. Systolic congestive heart failure, unspecified HF chronicity (CMD) - Experiencing shortness of breath, fatigue, and leg swelling for about 3 weeks. Symptoms have improved slightly, but significant fatigue and swelling persist.  - Swelling is present in both legs, with the left side slightly worse. Heart and lung sounds are clear. - Original plan was to complete blood work, including BNP and D-dimer tests, in addition to restarting metoprolol  succinate 25 mg once daily and initiating spironolactone  25 mg once daily. However, once ECG was obtained and demonstrated Afib, recommend ER evaluation. Referral to cardiology will remain placed, but will defer medication management and lab evaluation to the ER, as full evaluation is incomplete at this time. -Referral to an [facility] cardiologist has been made. - ECG 12 lead; Future  3. Atrial fibrillation, unspecified type (CMD) - EKG shows new-onset atrial fibrillation, contributing to symptoms of shortness of breath, fatigue, and leg swelling. - Advised to go to the ER for immediate evaluation and treatment to ensure  there is no blood clot and to start appropriate medication for atrial fibrillation. He expresses understanding and is agreeable to EMS transport.  - Ambulatory referral to Cardiology; Future     Problem List Items Addressed This Visit       Other   Dyspnea - Primary   Relevant Orders   ECG 12 lead   Other Visit Diagnoses       Systolic congestive heart failure, unspecified HF chronicity (CMD)       Relevant Orders   Ambulatory referral to Cardiology     Atrial fibrillation, unspecified type (CMD)       Relevant Orders   Ambulatory referral to Cardiology       Monitor: The problem is newly identified Evaluation: No labs/tests required today Assessment/Treatment ER evaluation  Please contact my office for worsening conditions or problems, and seek emergency medical treatment and/or call 911 if you or your family deems either necessary.  Portions of this note were  created using Kerr-[id number]. Although proofread, errors may be present.  Laneta Tanda Agent, PA-C 11:51 AM      [1] Past Medical History: Diagnosis Date   Acute hypoxemic respiratory failure (HCC) 09/26/2020   Acute lower UTI 09/26/2020   Acute pulmonary embolism (HCC) 08/27/2020   Community acquired pneumonia 03/22/2021   Pericardial effusion (CMD) 03/22/2021   Pleural effusion on left 09/19/2020   Last Assessment & Plan:   Formatting of this note might be different from the original.  EczemaHe has minimal detectable pleural effusion on exam on the left  I plan to repeat CT imaging in April       [2] Current Outpatient Medications on File Prior to Visit  Medication Sig Dispense Refill   albuterol  HFA (PROVENTIL  HFA;VENTOLIN  HFA;PROAIR  HFA) 90 mcg/actuation inhaler INHALE 2 PUFFS EVERY 6 HOURS AS NEEDED FOR WHEEZING 18 each 1   allopurinoL  (ZYLOPRIM ) 100 mg tablet Take 1 tablet (100 mg total) by mouth daily. 90 tablet 3   atorvastatin  (LIPITOR) 10 mg tablet TAKE 1 TABLET BY MOUTH EVERY  DAY 30 tablet 0   fluticasone propion-salmeteroL (ADVAIR DISKUS) 100-50 mcg/dose diskus inhaler Inhale 1 puff  in the morning and 1 puff before bedtime. 180 each 3   Lactobacillus acidophilus (Probiotic) 10 billion cell cap Take 1 tablet by mouth Once Daily.     magnesium  200 mg tab Take  by mouth.     multivitamin (THERAGRAN) tab tablet Take 1 tablet by mouth Once Daily.     tadalafiL (CIALIS) 20 mg tablet Take 1 tablet (20 mg total) by mouth daily as needed for erectile dysfunction Indications: the inability to have an erection. 10 tablet 0   TURMERIC ORAL Take by mouth. OTC     valsartan  (DIOVAN ) 160 mg tablet Take 1 tablet by mouth daily.     [DISCONTINUED] metoprolol  succinate (TOPROL  XL) 25 mg 24 hr tablet Take 25 mg by mouth daily.     No current facility-administered medications on file prior to visit.  [3]  Current Outpatient Medications:    albuterol  HFA (PROVENTIL  HFA;VENTOLIN  HFA;PROAIR  HFA) 90 mcg/actuation inhaler, INHALE 2 PUFFS EVERY 6 HOURS AS NEEDED FOR WHEEZING, Disp: 18 each, Rfl: 1   allopurinoL  (ZYLOPRIM ) 100 mg tablet, Take 1 tablet (100 mg total) by mouth daily., Disp: 90 tablet, Rfl: 3   atorvastatin  (LIPITOR) 10 mg tablet, TAKE 1 TABLET BY MOUTH EVERY DAY, Disp: 30 tablet, Rfl: 0   fluticasone propion-salmeteroL (ADVAIR DISKUS) 100-50 mcg/dose diskus inhaler, Inhale 1 puff  in the morning and 1 puff before bedtime., Disp: 180 each, Rfl: 3   Lactobacillus acidophilus (Probiotic) 10 billion cell cap, Take 1 tablet by mouth Once Daily., Disp: , Rfl:    magnesium  200 mg tab, Take  by mouth., Disp: , Rfl:    multivitamin (THERAGRAN) tab tablet, Take 1 tablet by mouth Once Daily., Disp: , Rfl:    tadalafiL (CIALIS) 20 mg tablet, Take 1 tablet (20 mg total) by mouth daily as needed for erectile dysfunction Indications: the inability to have an erection., Disp: 10 tablet, Rfl: 0   TURMERIC ORAL, Take by mouth. OTC, Disp: , Rfl:    valsartan  (DIOVAN ) 160 mg  tablet, Take 1 tablet by mouth daily., Disp: , Rfl:  [4] No Known Allergies [5] Past Surgical History: Procedure Laterality Date   OTHER SURGICAL HISTORY  04/30/2022   Procedure: OTHER SURGICAL HISTORY (heart ablation)  [6] Family History Problem Relation Name Age of Onset   Hearing loss  Mother     Heart disease Father     Heart attack Brother    [7] Social History Socioeconomic History   Marital status: Unknown  Tobacco Use   Smoking status: Some Days   Smokeless tobacco: Never  Vaping Use   Vaping status: Never Used  Substance and Sexual Activity   Alcohol  use: Yes   Social Drivers of Health   Living Situation: Low Risk (09/05/2024)   Living Situation    What is your living situation today?: I have a steady place to live    Think about the place you live. Do you have problems with any of the following? Choose all that apply:: None/None on this list  Food Insecurity: Low Risk (09/05/2024)   Food vital sign    Within the past 12 months, you worried that your food would run out before you got money to buy more: Never true    Within the past 12 months, the food you bought just didn't last and you didn't have money to get more: Never true  Transportation Needs: No Transportation Needs (09/05/2024)   Transportation    In the past 12 months, has lack of reliable transportation kept you from medical appointments, meetings, work or from getting things needed for daily living? : No  Utilities: Low Risk (09/05/2024)   Utilities    In the past 12 months has the electric, gas, oil, or water company threatened to shut off services in your home? : No  Safety: Low Risk (09/05/2024)   Safety    How often does anyone, including family and friends, physically hurt you?: Never    How often does anyone, including family and friends, insult or talk down to you?: Never    How often does anyone, including family and friends, threaten you with harm?: Never    How often does  anyone, including family and friends, scream or curse at you?: Never  Tobacco Use: High Risk (12/02/2024)   Patient History    Smoking Tobacco Use: Some Days    Smokeless Tobacco Use: Never  Depression: Not At Risk (09/05/2024)   PHQ-2    PHQ-2 Score: 0

## 2024-12-02 NOTE — ED Triage Notes (Signed)
 According to [facility]: pt is coming for afib with rvr, hx of afib. Pt has likely been in afib for weeks.Pt has 35 mg of Cardizem  with no improvement. Hr is still 150-200 with no improvement. No chest pain.  350 ml of normal saline given on ride. 18 piv in left ac.  Bp 122/70 Hr 150 RR 16 Spo2 98

## 2024-12-03 ENCOUNTER — Observation Stay (HOSPITAL_COMMUNITY)

## 2024-12-03 DIAGNOSIS — I5023 Acute on chronic systolic (congestive) heart failure: Secondary | ICD-10-CM

## 2024-12-03 DIAGNOSIS — I4892 Unspecified atrial flutter: Secondary | ICD-10-CM | POA: Diagnosis not present

## 2024-12-03 DIAGNOSIS — Z86711 Personal history of pulmonary embolism: Secondary | ICD-10-CM

## 2024-12-03 DIAGNOSIS — Z79899 Other long term (current) drug therapy: Secondary | ICD-10-CM | POA: Diagnosis not present

## 2024-12-03 DIAGNOSIS — E66811 Obesity, class 1: Secondary | ICD-10-CM

## 2024-12-03 DIAGNOSIS — I484 Atypical atrial flutter: Secondary | ICD-10-CM | POA: Diagnosis present

## 2024-12-03 DIAGNOSIS — Z7901 Long term (current) use of anticoagulants: Secondary | ICD-10-CM | POA: Diagnosis not present

## 2024-12-03 DIAGNOSIS — I4891 Unspecified atrial fibrillation: Secondary | ICD-10-CM | POA: Diagnosis present

## 2024-12-03 DIAGNOSIS — M109 Gout, unspecified: Secondary | ICD-10-CM

## 2024-12-03 DIAGNOSIS — E785 Hyperlipidemia, unspecified: Secondary | ICD-10-CM

## 2024-12-03 DIAGNOSIS — I4510 Unspecified right bundle-branch block: Secondary | ICD-10-CM | POA: Diagnosis present

## 2024-12-03 DIAGNOSIS — I480 Paroxysmal atrial fibrillation: Secondary | ICD-10-CM | POA: Diagnosis present

## 2024-12-03 DIAGNOSIS — F17290 Nicotine dependence, other tobacco product, uncomplicated: Secondary | ICD-10-CM | POA: Diagnosis present

## 2024-12-03 DIAGNOSIS — J449 Chronic obstructive pulmonary disease, unspecified: Secondary | ICD-10-CM | POA: Diagnosis present

## 2024-12-03 DIAGNOSIS — Z8249 Family history of ischemic heart disease and other diseases of the circulatory system: Secondary | ICD-10-CM | POA: Diagnosis not present

## 2024-12-03 DIAGNOSIS — Z6834 Body mass index (BMI) 34.0-34.9, adult: Secondary | ICD-10-CM | POA: Diagnosis not present

## 2024-12-03 DIAGNOSIS — I10 Essential (primary) hypertension: Secondary | ICD-10-CM

## 2024-12-03 DIAGNOSIS — I110 Hypertensive heart disease with heart failure: Secondary | ICD-10-CM | POA: Diagnosis present

## 2024-12-03 LAB — CBC
HCT: 42.1 % (ref 39.0–52.0)
Hemoglobin: 14 g/dL (ref 13.0–17.0)
MCH: 34.1 pg — ABNORMAL HIGH (ref 26.0–34.0)
MCHC: 33.3 g/dL (ref 30.0–36.0)
MCV: 102.4 fL — ABNORMAL HIGH (ref 80.0–100.0)
Platelets: 152 10*3/uL (ref 150–400)
RBC: 4.11 MIL/uL — ABNORMAL LOW (ref 4.22–5.81)
RDW: 14 % (ref 11.5–15.5)
WBC: 6.1 10*3/uL (ref 4.0–10.5)
nRBC: 0 % (ref 0.0–0.2)

## 2024-12-03 LAB — ECHOCARDIOGRAM COMPLETE
AR max vel: 2.29 cm2
AV Area VTI: 2.19 cm2
AV Area mean vel: 2.16 cm2
AV Mean grad: 2 mmHg
AV Peak grad: 3.8 mmHg
Ao pk vel: 0.98 m/s
Calc EF: 38.3 %
Height: 70 in
S' Lateral: 4.4 cm
Single Plane A2C EF: 37.2 %
Single Plane A4C EF: 38 %
Weight: 3859.2 [oz_av]

## 2024-12-03 LAB — BASIC METABOLIC PANEL WITH GFR
Anion gap: 11 (ref 5–15)
BUN: 16 mg/dL (ref 8–23)
CO2: 22 mmol/L (ref 22–32)
Calcium: 9.3 mg/dL (ref 8.9–10.3)
Chloride: 108 mmol/L (ref 98–111)
Creatinine, Ser: 0.88 mg/dL (ref 0.61–1.24)
GFR, Estimated: >60 mL/min
Glucose, Bld: 124 mg/dL — ABNORMAL HIGH (ref 70–99)
Potassium: 4 mmol/L (ref 3.5–5.1)
Sodium: 141 mmol/L (ref 135–145)

## 2024-12-03 LAB — LIPID PANEL
Cholesterol: 95 mg/dL (ref 0–200)
HDL: 40 mg/dL — ABNORMAL LOW
LDL Cholesterol: 44 mg/dL (ref 0–99)
Total CHOL/HDL Ratio: 2.4 ratio
Triglycerides: 58 mg/dL
VLDL: 12 mg/dL (ref 0–40)

## 2024-12-03 LAB — PRO BRAIN NATRIURETIC PEPTIDE: Pro Brain Natriuretic Peptide: 208 pg/mL

## 2024-12-03 LAB — HEMOGLOBIN A1C
Hgb A1c MFr Bld: 5.6 % (ref 4.8–5.6)
Mean Plasma Glucose: 114.02 mg/dL

## 2024-12-03 MED ORDER — DILTIAZEM HCL-DEXTROSE 125-5 MG/125ML-% IV SOLN (PREMIX)
5.0000 mg/h | INTRAVENOUS | Status: DC
  Administered 2024-12-03: 5 mg/h via INTRAVENOUS
  Filled 2024-12-03: qty 125

## 2024-12-03 MED ORDER — DILTIAZEM LOAD VIA INFUSION
10.0000 mg | Freq: Once | INTRAVENOUS | Status: AC
  Administered 2024-12-03: 10 mg via INTRAVENOUS
  Filled 2024-12-03: qty 10

## 2024-12-03 MED ORDER — METOPROLOL TARTRATE 100 MG PO TABS
100.0000 mg | ORAL_TABLET | Freq: Two times a day (BID) | ORAL | Status: DC
  Administered 2024-12-03 – 2024-12-05 (×5): 100 mg via ORAL
  Filled 2024-12-03 (×5): qty 1

## 2024-12-03 NOTE — Assessment & Plan Note (Signed)
 Echocardiogram with reduced LV systolic function 25 to 30%, global hypokinesis, mild dilatation of LV cavity, RV systolic function with moderate reduction, RVSP 25.7 mmHg, LA and RA with moderate dilatation, no significant valvular dysfunction   Urine output 675  ml Systolic blood pressure 100 mmHg range  Continue metoprolol  100 mg bid To consider adding spironolactone  and SGLT 2 inh    Holding afterload reduction for now due to risk of hypotension

## 2024-12-03 NOTE — Progress Notes (Signed)
 BP below parameters for diltiazem  IV drip; discontinued.  Notified Dr's Arrien (internal medicine) and Suleiman (cardiology) to make aware.  Patient reporting no problems at this time.   12/03/24 1828  Vitals  BP (!) 83/72  MAP (mmHg) 78  BP Location Right Arm  BP Method Automatic  Patient Position (if appropriate) Lying  Pulse Rate 78  Pulse Rate Source Monitor  ECG Heart Rate 78  Resp 16  Level of Consciousness  Level of Consciousness Alert  MEWS COLOR  MEWS Score Color Green  Oxygen Therapy  SpO2 98 %  O2 Device Room Air  MEWS Score  MEWS Temp 0  MEWS Systolic 1  MEWS Pulse 0  MEWS RR 0  MEWS LOC 0  MEWS Score 1

## 2024-12-03 NOTE — Progress Notes (Signed)
"  °  Progress Note  Patient Name: Carl [name] Date of Encounter: 12/03/2024 [facility] Cardiologist: None   Interval Summary   Sleeping with the head of the bed at about 20 degrees elevation, comfortably without any respiratory difficulty, ventricular rate 140 bpm.  Vital Signs Vitals:   12/03/24 0456 12/03/24 0845 12/03/24 0924 12/03/24 1115  BP: 109/74   122/89  Pulse: (!) 122  (!) 117 (!) 133  Resp: 16 18  18   Temp: 98.1 F (36.7 C) (!) 97.4 F (36.3 C)  (!) 97.5 F (36.4 C)  TempSrc: Oral Oral  Oral  SpO2: 95%  92% 95%  Weight: 109.4 kg     Height:        Intake/Output Summary (Last 24 hours) at 12/03/2024 1122 Last data filed at 12/03/2024 0846 Gross per 24 hour  Intake 240 ml  Output 1875 ml  Net -1635 ml      12/03/2024    4:56 AM 12/02/2024    1:20 PM 08/12/2023    9:38 AM  Last 3 Weights  Weight (lbs) 241 lb 3.2 oz 243 lb 240 lb  Weight (kg) 109.408 kg 110.224 kg 108.863 kg      Telemetry/ECG  Fibrillation rapid ventricular response- Personally Reviewed  Physical Exam  GEN: No acute distress.   Neck: No JVD Cardiac: rapid irregular rhythm, no murmurs, rubs, or gallops.  Respiratory: Clear to auscultation bilaterally. GI: Soft, nontender, non-distended  MS: No edema  Assessment & Plan  69 year old man with a history of ablation for atrial flutter in 2023, HTN, HLP, COPD, history of heart failure with recovered ejection fraction (presumably tachycardia mediated), presents with fatigue for the last roughly 2 weeks and found to have atrial fibrillation rapid ventricular response.  Has not been on anticoagulants in the last 2 years.  Unfortunately we cannot perform either TEE or cardioversion over the weekend due to the lack of anesthesiology backup support.  Will plan to schedule those procedures on Monday.  Point-of-care ultrasound suggested reduction in LVEF, awaiting formal echocardiogram today.  Remains markedly tachycardic despite oral  metoprolol  at a total dose of 200 mg daily.  Will add back intravenous diltiazem .  I think the benefit from rate control will outweigh the negative inotropic effect of the drug.  Consolidate the metoprolol  to twice daily administration.If blood pressure allows us , will plan to introduce other medications for reduced systolic function such as Entresto and spironolactone .    For questions or updates, please contact [facility] Please consult [email or web address] for contact info under         Signed, Jerel Balding, MD   "

## 2024-12-03 NOTE — Plan of Care (Signed)

## 2024-12-03 NOTE — Progress Notes (Signed)
 " Progress Note   Patient: Carl [name] FMW:[id number] DOB: [birth year] DOA: 12/02/2024     0 DOS: the patient was seen and examined on 12/03/2024   Brief hospital course: Carl [name] was admitted to the hospital with the working diagnosis of atrial fibrillation.   69 year old male past medical history of paroxysmal atrial fibrillation, hypertension, hyperlipidemia, ,COPD, heart failure, obesity and pulmonary embolism who presented with dyspnea.  Reported progressive dyspnea, fatigue, generalized weakness, palpitation and weight gain for past 2 weeks prior to admission. Because persistent symptoms EMS was called, patient was found in atrial fibrillation with rapid ventricular response, 150 to 200 bpm, with blood pressure systolic of 122 mmHg. He had 35 mg of diltiazem  with no response than he was transported to the ED.  On his initial physical examination in the ED his blood pressure was 125/82, RR 27, HR 150 and 02 saturation 98% Lungs with no wheezing or rhonchi, heart with S1 and S2 present and tachycardic, irregularly irregular, abdomen with no distention and positive lower extremity edema ++  Na 141, K 4.4 Cl 108 bicarbonate 22, glucose 81 bun 13 cr 0,83  Mg 1,8  Pro BNP 232  High sensitive troponin 14 and 13  Wbc 6.6 hgb 14.0 plt 148  TSH 1,56   Chest radiograph with hypoinflation, positive cardiomegaly, mild bilateral hilar vascular congestion, no effusions or infiltrates.   EKG 111 bpm, right axis deviation, right bundle branch block, qtc 495, atrial flutter rhythm with variable block, no significant ST segment or  T wave changes.   Patient was placed on diltiazem  infusion for rate control,  Assessment and Plan: * Acute on chronic systolic (congestive) heart failure (HCC) Echocardiogram with reduced LV systolic function 25 to 30%, global hypokinesis, mild dilatation of LV cavity, RV systolic function with moderate reduction, RVSP 25.7 mmHg, LA and RA with moderate dilatation, no  significant valvular dysfunction   Urine output 1400 ml Systolic blood pressure 100 mmHg range  Continue metoprolol  100 mg bid To consider adding spironolactone  and SGLT 2 inh  Had furosemide  60 mg yesterday  Holding afterload reduction for now due to risk of hypotension   Atypical atrial flutter (HCC) Atrial fibrillation  Patient has been placed on metoprolol , and this morning added IV diltiazem  Anticoagulation with apixaban   Continue blood pressure monitoring  Continue telemetry monitoring  If worsening hypotension may need antiarrhythmic therapy   Essential hypertension Continue blood pressure monitoring On metoprolol  and diltiazem  for heart rate control  At home patient on valsartan  160 mg   Dyslipidemia Continue atorvastatin   History of pulmonary embolism Remote history of PE in 2021 Continue anticoagulation with apixaban    Gout No acute attack Continue allopurinol   Obesity, class 1 Calculated BMI is 34.6       Subjective: Patient with no chest pain and no dyspnea, no palpitations.   Physical Exam: Vitals:   12/02/24 2037 12/03/24 0018 12/03/24 0456 12/03/24 0845  BP: 110/72 118/75 109/74   Pulse: (!) 127 60 (!) 122   Resp: 16 20 16 18   Temp: 98.4 F (36.9 C) 98.3 F (36.8 C) 98.1 F (36.7 C) (!) 97.4 F (36.3 C)  TempSrc: Oral Oral Oral Oral  SpO2: 96% 94% 95%   Weight:   109.4 kg   Height:       Neurology awake and alert ENT with mild pallor with no icterus Cardiovascular with S1 and S2 present, irregular with no gallops or rubs, no murmurs Mild JVD  Respiratory with positive rales at bases  with no wheezing, no rhonchi Abdomen protuberant, soft and non tender Lower extremity edema +  Data Reviewed:    Family Communication: no family at the bedside   Disposition: Status is: Observation The patient will require care spanning > 2 midnights and should be moved to inpatient because: cardioversion   Planned Discharge Destination:  Home      Author: Elidia Toribio Furnace, MD 12/03/2024 9:49 AM  For on call review [email or web address].  "

## 2024-12-03 NOTE — Assessment & Plan Note (Signed)
Calculated BMI is 34.6  ?

## 2024-12-03 NOTE — Evaluation (Signed)
 Physical Therapy Evaluation Patient Details Name: Carl [name] MRN: [id number] DOB: 1956/06/03 Today's Date: 12/03/2024  History of Present Illness  Pt is a 69 y.o. M presenting to [facility] on [facility] with SOB, fatigue, generalized weakness and weight gain for the past 2 weeks. IMG shows no acute findings. PMH is significant for A-fib, HTN, HLD, COPD, remote PE, and systolic CHF.   Clinical Impression  Prior to admittance, pt was mobilizing independently and was indep with ADLs. Pt presents to evaluation near baseline level of function ambulating community level distances without physical assistance, negotiating narrows spaces and stepping over objects in hallway with no losses of balance or signs of instability. Pt also reports confidence in stair negotiation. Pt was encouraged to continue to mobilize frequently both while admitted and following D/C with pt reporting he enjoys taking walks while at home. Anticipate no PT needs at discharge. PT signing off.         If plan is discharge home, recommend the following: Assist for transportation   Can travel by private vehicle        Equipment Recommendations None recommended by PT  Recommendations for Other Services       Functional Status Assessment Patient has not had a recent decline in their functional status     Precautions / Restrictions Precautions Precautions: None Restrictions Weight Bearing Restrictions Per Provider Order: No      Mobility  Bed Mobility Overal bed mobility: Modified Independent             General bed mobility comments: increased time to complete    Transfers Overall transfer level: Modified independent Equipment used: None               General transfer comment: STS from EOB with BUE pushing up from bed and no physical assistance.    Ambulation/Gait Ambulation/Gait assistance: Supervision Gait Distance (Feet): 400 Feet Assistive device: IV Pole Gait Pattern/deviations: WFL(Within  Functional Limits) Gait velocity: functional Gait velocity interpretation: 1.31 - 2.62 ft/sec, indicative of limited community ambulator   General Gait Details: Pt demonstrates reciprocal gait pattern with equal WB through BLE and no signs of instabilty. Pt able to negotiate around tight spaces and step over objects in hallway with no LOB.  Stairs            Wheelchair Mobility     Tilt Bed    Modified Rankin (Stroke Patients Only)       Balance Overall balance assessment: Modified Independent                                           Pertinent Vitals/Pain Pain Assessment Pain Assessment: No/denies pain    Home Living Family/patient expects to be discharged to:: Private residence Living Arrangements: Alone Available Help at Discharge: Friend(s);Available PRN/intermittently Type of Home: House Home Access: Stairs to enter Entrance Stairs-Rails: Right;Left;Can reach both Entrance Stairs-Number of Steps: 12   Home Layout: Two level;Able to live on main level with bedroom/bathroom Home Equipment: Toilet riser;Cane - single point;Crutches      Prior Function Prior Level of Function : Independent/Modified Independent;Working/employed;Driving (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, [employer]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)             Mobility Comments: indep ADLs Comments: indep     Extremity/Trunk Assessment   Upper Extremity Assessment Upper Extremity Assessment: Overall [facility]  for tasks assessed    Lower Extremity Assessment Lower Extremity Assessment: Overall WFL for tasks assessed    Cervical / Trunk Assessment Cervical / Trunk Assessment: Kyphotic  Communication   Communication Communication: No apparent difficulties    Cognition Arousal: Alert Behavior During Therapy: WFL for tasks assessed/performed   PT - Cognitive impairments: No apparent impairments                         Following commands: Intact        Cueing Cueing Techniques: Verbal cues, Gestural cues     General Comments General comments (skin integrity, edema, etc.): Max HR in session: 140; SpO2 96%    Exercises     Assessment/Plan    PT Assessment Patient does not need any further PT services  PT Problem List         PT Treatment Interventions      PT Goals (Current goals can be found in the Care Plan section)  Acute Rehab PT Goals Patient Stated Goal: to be able to go home PT Goal Formulation: With patient Time For Goal Achievement: 12/17/24 Potential to Achieve Goals: Good    Frequency       Co-evaluation               AM-PAC PT 6 Clicks Mobility  Outcome Measure Help needed turning from your back to your side while in a flat bed without using bedrails?: None Help needed moving from lying on your back to sitting on the side of a flat bed without using bedrails?: None Help needed moving to and from a bed to a chair (including a wheelchair)?: None Help needed standing up from a chair using your arms (e.g., wheelchair or bedside chair)?: None Help needed to walk in hospital room?: None Help needed climbing 3-5 steps with a railing? : A Little 6 Click Score: 23    End of Session Equipment Utilized During Treatment: Gait belt Activity Tolerance: Patient tolerated treatment well Patient left: in bed;with call bell/phone within reach;Other (comment) (staff performing ECHO) Nurse Communication: Mobility status PT Visit Diagnosis: Other abnormalities of gait and mobility (R26.89)    Time: 8789-8768 PT Time Calculation (min) (ACUTE ONLY): 21 min   Charges:   PT Evaluation $PT Eval Low Complexity: 1 Low   PT General Charges $$ ACUTE PT VISIT: 1 Visit         Leontine Hilt DPT Acute Rehab Services [phone] Prefer contact via chat   Leontine B Treasure [place] 12/03/2024, 1:30 PM

## 2024-12-03 NOTE — Assessment & Plan Note (Addendum)
 Atrial fibrillation  Patient was placed on AV blockade, with metoprolol  and digoxin  He did not tolerate diltiazem .   01/26 direct current cardioversion with successful conversion to sinus rhythm.   Plan to continue metoprolol  100 mg po bid Continue anticoagulation with apixaban 

## 2024-12-03 NOTE — Progress Notes (Signed)
 BP change noted with IV diltiazem  infusion at 5mg /hr following initial 10mg  IV bolus.  Patient is asymptomatic; no reported chest discomfort, lightheadedness or dizziness.  Possible outlier BP.  Will recheck in 30 minutes.   12/03/24 1235  Vitals  BP 90/61 (assymptomatic)  MAP (mmHg) 70  BP Location Right Arm  BP Method Automatic  Patient Position (if appropriate) Lying  Pulse Rate (!) 112  ECG Heart Rate (!) 112  Resp 16  Level of Consciousness  Level of Consciousness Alert  Oxygen Therapy  SpO2 95 %  O2 Device Nasal Cannula  O2 Flow Rate (L/min) 2 L/min

## 2024-12-04 DIAGNOSIS — I484 Atypical atrial flutter: Secondary | ICD-10-CM | POA: Diagnosis not present

## 2024-12-04 DIAGNOSIS — I10 Essential (primary) hypertension: Secondary | ICD-10-CM | POA: Diagnosis not present

## 2024-12-04 DIAGNOSIS — E785 Hyperlipidemia, unspecified: Secondary | ICD-10-CM | POA: Diagnosis not present

## 2024-12-04 DIAGNOSIS — I5023 Acute on chronic systolic (congestive) heart failure: Secondary | ICD-10-CM | POA: Diagnosis not present

## 2024-12-04 LAB — MAGNESIUM: Magnesium: 1.9 mg/dL (ref 1.7–2.4)

## 2024-12-04 LAB — CBC
HCT: 42.1 % (ref 39.0–52.0)
Hemoglobin: 14.2 g/dL (ref 13.0–17.0)
MCH: 34.7 pg — ABNORMAL HIGH (ref 26.0–34.0)
MCHC: 33.7 g/dL (ref 30.0–36.0)
MCV: 102.9 fL — ABNORMAL HIGH (ref 80.0–100.0)
Platelets: 176 10*3/uL (ref 150–400)
RBC: 4.09 MIL/uL — ABNORMAL LOW (ref 4.22–5.81)
RDW: 13.9 % (ref 11.5–15.5)
WBC: 5.6 10*3/uL (ref 4.0–10.5)
nRBC: 0 % (ref 0.0–0.2)

## 2024-12-04 LAB — BASIC METABOLIC PANEL WITH GFR
Anion gap: 7 (ref 5–15)
BUN: 19 mg/dL (ref 8–23)
CO2: 24 mmol/L (ref 22–32)
Calcium: 9 mg/dL (ref 8.9–10.3)
Chloride: 106 mmol/L (ref 98–111)
Creatinine, Ser: 0.82 mg/dL (ref 0.61–1.24)
GFR, Estimated: >60 mL/min
Glucose, Bld: 94 mg/dL (ref 70–99)
Potassium: 4.4 mmol/L (ref 3.5–5.1)
Sodium: 137 mmol/L (ref 135–145)

## 2024-12-04 MED ORDER — SODIUM CHLORIDE 0.9 % IV SOLN: INTRAVENOUS | Status: DC

## 2024-12-04 MED ORDER — MAGNESIUM SULFATE 2 GM/50ML IV SOLN
2.0000 g | Freq: Once | INTRAVENOUS | Status: AC
  Administered 2024-12-04: 2 g via INTRAVENOUS
  Filled 2024-12-04: qty 50

## 2024-12-04 MED ORDER — DIGOXIN 125 MCG PO TABS
0.2500 mg | ORAL_TABLET | ORAL | Status: AC
  Administered 2024-12-04 (×3): 0.25 mg via ORAL
  Filled 2024-12-04 (×3): qty 2

## 2024-12-04 MED ORDER — METOPROLOL TARTRATE 5 MG/5ML IV SOLN
2.5000 mg | Freq: Once | INTRAVENOUS | Status: DC | PRN
  Filled 2024-12-04: qty 5

## 2024-12-04 NOTE — Progress Notes (Signed)
 The patient's HR sustaining >130 but refused to take the PRN Metoprolol  ordered by Dr. Marcene. His reason being that he just went to the bathroom and was straining. He said, Let me settle down and if it doesn't come down then you give it. The patient is educated to sit while the HR is high to avoid an dizziness and passing out.  He agreed and compliant.

## 2024-12-04 NOTE — Plan of Care (Signed)
" °  Problem: Education: Goal: Knowledge of General Education information will improve Description: Including pain rating scale, medication(s)/side effects and non-pharmacologic comfort measures Outcome: Progressing   Problem: Clinical Measurements: Goal: Will remain free from infection Outcome: Progressing Goal: Respiratory complications will improve Outcome: Progressing   Problem: Activity: Goal: Risk for activity intolerance will decrease Outcome: Progressing   Problem: Elimination: Goal: Will not experience complications related to bowel motility Outcome: Progressing Goal: Will not experience complications related to urinary retention Outcome: Progressing   Problem: Pain Managment: Goal: General experience of comfort will improve and/or be controlled Outcome: Progressing   Problem: Clinical Measurements: Goal: Ability to maintain clinical measurements within normal limits will improve Outcome: Not Progressing Goal: Cardiovascular complication will be avoided Outcome: Not Progressing   "

## 2024-12-04 NOTE — Plan of Care (Signed)

## 2024-12-04 NOTE — H&P (View-Only) (Signed)
 "  Progress Note  Patient Name: Carl [name] Date of Encounter: 12/04/2024 [facility] Cardiologist: None   Interval Summary   No subjective cardiovascular complaints at rest, but continues to have RVR in the 120s, even when asleep.  Intravenous diltiazem  was stopped due to hypotension.  The dose of beta-blocker was increased.  Vital Signs Vitals:   12/03/24 2044 12/04/24 0029 12/04/24 0544 12/04/24 0808  BP: 96/73 110/85 108/87 104/88  Pulse: 94 (!) 105 (!) 133   Resp: 16 20 20 18   Temp: 98.4 F (36.9 C) 98.4 F (36.9 C) 97.9 F (36.6 C) (!) 97.5 F (36.4 C)  TempSrc: Oral Oral Oral Oral  SpO2: 98% 99% 98% 98%  Weight:   110.4 kg   Height:        Intake/Output Summary (Last 24 hours) at 12/04/2024 1125 Last data filed at 12/04/2024 1000 Gross per 24 hour  Intake 560.55 ml  Output 200 ml  Net 360.55 ml      12/04/2024    5:44 AM 12/03/2024    4:56 AM 12/02/2024    1:20 PM  Last 3 Weights  Weight (lbs) 243 lb 4.8 oz 241 lb 3.2 oz 243 lb  Weight (kg) 110.36 kg 109.408 kg 110.224 kg      Telemetry/ECG  Fibrillation rapid ventricular response- Personally Reviewed  Physical Exam  GEN: No acute distress.   Neck: No JVD Cardiac: Irregular, no murmurs, rubs, or gallops.  Respiratory: Clear to auscultation bilaterally. GI: Soft, nontender, non-distended  MS: No edema  Assessment & Plan  69 year old man with a history of ablation for atrial flutter in 2023, HTN, HLP, COPD, history of heart failure with recovered ejection fraction (presumably tachycardia mediated), presents with fatigue for the last roughly 2 weeks and found to have atrial fibrillation rapid ventricular response.  Has not been on anticoagulants in the last 2 years.  Echocardiogram on this admission once again shows severely depressed LVEF at 25-30% with global hypokinesis and a mildly dilated left ventricle, moderate biatrial dilation, no serious valvular abnormalities and no signs of pulmonary  hypertension.  Difficult rate control since he developed hypotension with addition of diltiazem .  Remains in significant RVR despite relatively high dose beta-blocker (metoprolol  100 mg twice daily).  Will add digoxin , but plan TEE-cardioversion tomorrow.  Can use digoxin  long-term if cardioversion has to be postponed for left atrial thrombus.  He has been on apixaban  for the last 48 hours.  In the long run he will be best served by redo ablation (pulmonary vein isolation) for atrial fibrillation.  He does not appear to have overt congestive heart failure/ signs of hypervolemia.  Blood pressure does not allow addition of other guideline directed medical therapy for cardiomyopathy at this time, revisit this after his cardioversion.   Informed Consent   Shared Decision Making/Informed Consent   The risks [stroke, cardiac arrhythmias rarely resulting in the need for a temporary or permanent pacemaker, skin irritation or burns, esophageal damage, perforation (1:10,000 risk), bleeding, pharyngeal hematoma as well as other potential complications associated with conscious sedation including aspiration, arrhythmia, respiratory failure and death], benefits (treatment guidance, restoration of normal sinus rhythm, diagnostic support) and alternatives of a transesophageal echocardiogram guided cardioversion were discussed in detail with Carl [name] and he is willing to proceed.        For questions or updates, please contact [facility] Please consult [email or web address] for contact info under         Signed, Jerel Balding, MD   "

## 2024-12-04 NOTE — Progress Notes (Addendum)
 " Progress Note   Patient: Carl [name] FMW:[id number] DOB: 12/03/1955 DOA: 12/02/2024     1 DOS: the patient was seen and examined on 12/04/2024   Brief hospital course: Carl [name] was admitted to the hospital with the working diagnosis of atrial fibrillation.   69 year old male past medical history of paroxysmal atrial fibrillation, hypertension, hyperlipidemia, ,COPD, heart failure, obesity and pulmonary embolism who presented with dyspnea.  Reported progressive dyspnea, fatigue, generalized weakness, palpitation and weight gain for past 2 weeks prior to admission. Because persistent symptoms EMS was called, patient was found in atrial fibrillation with rapid ventricular response, 150 to 200 bpm, with blood pressure systolic of 122 mmHg. He had 35 mg of diltiazem  with no response than he was transported to the ED.  On his initial physical examination in the ED his blood pressure was 125/82, RR 27, HR 150 and 02 saturation 98% Lungs with no wheezing or rhonchi, heart with S1 and S2 present and tachycardic, irregularly irregular, abdomen with no distention and positive lower extremity edema ++  Na 141, K 4.4 Cl 108 bicarbonate 22, glucose 81 bun 13 cr 0,83  Mg 1,8  Pro BNP 232  High sensitive troponin 14 and 13  Wbc 6.6 hgb 14.0 plt 148  TSH 1,56   Chest radiograph with hypoinflation, positive cardiomegaly, mild bilateral hilar vascular congestion, no effusions or infiltrates.   EKG 111 bpm, right axis deviation, right bundle branch block, qtc 495, atrial flutter rhythm with variable block, no significant ST segment or  T wave changes.   Patient was placed on diltiazem  infusion for rate control, 01/25 continue with rapid ventricular response, plan for direct current cardioversion tomorrow   Assessment and Plan: * Acute on chronic systolic (congestive) heart failure (HCC) Echocardiogram with reduced LV systolic function 25 to 30%, global hypokinesis, mild dilatation of LV cavity, RV  systolic function with moderate reduction, RVSP 25.7 mmHg, LA and RA with moderate dilatation, no significant valvular dysfunction   Urine output 675  ml Systolic blood pressure 100 mmHg range  Continue metoprolol  100 mg bid To consider adding spironolactone  and SGLT 2 inh    Holding afterload reduction for now due to risk of hypotension   Atypical atrial flutter (HCC) Atrial fibrillation  Patient continue to have rapid ventricular response.  Heart rate 130, did not tolerate diltiazem  due to hypotension.   Plan to continue metoprolol  100 mg po bid Added digoxin  0.25 mg q 4 hrs Continue anticoagulation with apixaban  Continue telemetry monitoring   Today Mg 1,9 and K at 4,4  Keep Mg at 2 and K 4, will add 2 g Mg IV  and follow up electrolytes and renal function in am.   Essential hypertension Hypotension Continue to hold on valsartan .  Continue blood pressure monitoring   Dyslipidemia Continue atorvastatin   History of pulmonary embolism Remote history of PE in 2021 Continue anticoagulation with apixaban    Gout No acute attack Continue allopurinol   Obesity, class 1 Calculated BMI is 34.6       Subjective: Patient denies any dyspnea, PND or orthopnea, no palpitations or chest pain, has been ambulating   Physical Exam: Vitals:   12/03/24 2044 12/04/24 0029 12/04/24 0544 12/04/24 0808  BP: 96/73 110/85 108/87 104/88  Pulse: 94 (!) 105 (!) 133   Resp: 16 20 20 18   Temp: 98.4 F (36.9 C) 98.4 F (36.9 C) 97.9 F (36.6 C) (!) 97.5 F (36.4 C)  TempSrc: Oral Oral Oral Oral  SpO2: 98% 99% 98% 98%  Weight:   110.4 kg   Height:       Neurology awake and alert ENT with mild pallor Cardiovascular with S1 and S2 present and tachycardic, irregular with no gallops or rubs, no murmur No JVD Respiratory with no rales or wheezing, no rhonchi Abdomen with no distention  Trace lower extremity edema  Data Reviewed:    Family Communication: no family at the bedside    Disposition: Status is: Inpatient Remains inpatient appropriate because: pending direct current cardioversion   Planned Discharge Destination: Home     Author: Elidia Toribio Furnace, MD 12/04/2024 11:49 AM  For on call review [email or web address].  "

## 2024-12-04 NOTE — Progress Notes (Signed)
 "  Progress Note  Patient Name: Carl [name] Date of Encounter: 12/04/2024 [name] Cardiologist: None   Interval Summary   No subjective cardiovascular complaints at rest, but continues to have RVR in the 120s, even when asleep.  Intravenous diltiazem  was stopped due to hypotension.  The dose of beta-blocker was increased.  Vital Signs Vitals:   12/03/24 2044 12/04/24 0029 12/04/24 0544 12/04/24 0808  BP: 96/73 110/85 108/87 104/88  Pulse: 94 (!) 105 (!) 133   Resp: 16 20 20 18   Temp: 98.4 F (36.9 C) 98.4 F (36.9 C) 97.9 F (36.6 C) (!) 97.5 F (36.4 C)  TempSrc: Oral Oral Oral Oral  SpO2: 98% 99% 98% 98%  Weight:   110.4 kg   Height:        Intake/Output Summary (Last 24 hours) at 12/04/2024 1125 Last data filed at 12/04/2024 1000 Gross per 24 hour  Intake 560.55 ml  Output 200 ml  Net 360.55 ml      12/04/2024    5:44 AM 12/03/2024    4:56 AM 12/02/2024    1:20 PM  Last 3 Weights  Weight (lbs) 243 lb 4.8 oz 241 lb 3.2 oz 243 lb  Weight (kg) 110.36 kg 109.408 kg 110.224 kg      Telemetry/ECG  Fibrillation rapid ventricular response- Personally Reviewed  Physical Exam  GEN: No acute distress.   Neck: No JVD Cardiac: Irregular, no murmurs, rubs, or gallops.  Respiratory: Clear to auscultation bilaterally. GI: Soft, nontender, non-distended  MS: No edema  Assessment & Plan  69 year old man with a history of ablation for atrial flutter in 2023, HTN, HLP, COPD, history of heart failure with recovered ejection fraction (presumably tachycardia mediated), presents with fatigue for the last roughly 2 weeks and found to have atrial fibrillation rapid ventricular response.  Has not been on anticoagulants in the last 2 years.  Echocardiogram on this admission once again shows severely depressed LVEF at 25-30% with global hypokinesis and a mildly dilated left ventricle, moderate biatrial dilation, no serious valvular abnormalities and no signs of pulmonary  hypertension.  Difficult rate control since he developed hypotension with addition of diltiazem .  Remains in significant RVR despite relatively high dose beta-blocker (metoprolol  100 mg twice daily).  Will add digoxin , but plan TEE-cardioversion tomorrow.  Can use digoxin  long-term if cardioversion has to be postponed for left atrial thrombus.  He has been on apixaban  for the last 48 hours.  In the long run he will be best served by redo ablation (pulmonary vein isolation) for atrial fibrillation.  He does not appear to have overt congestive heart failure/ signs of hypervolemia.  Blood pressure does not allow addition of other guideline directed medical therapy for cardiomyopathy at this time, revisit this after his cardioversion.   Informed Consent   Shared Decision Making/Informed Consent   The risks [stroke, cardiac arrhythmias rarely resulting in the need for a temporary or permanent pacemaker, skin irritation or burns, esophageal damage, perforation (1:10,000 risk), bleeding, pharyngeal hematoma as well as other potential complications associated with conscious sedation including aspiration, arrhythmia, respiratory failure and death], benefits (treatment guidance, restoration of normal sinus rhythm, diagnostic support) and alternatives of a transesophageal echocardiogram guided cardioversion were discussed in detail with Carl [name] and he is willing to proceed.        For questions or updates, please contact [facility] Please consult [email or web address] for contact info under         Signed, Jerel Balding, MD   "

## 2024-12-04 NOTE — Progress Notes (Signed)
 [facility] night cross cover note:   Regarding this patient who was admitted on 12/02/2024 for new diagnosis of atrial fibrillation with RVR and started on diltiazem  drip per recommendation of cardiology, I followed-up on patient's BP and HR after discontinuation of diltiazem  drip towards the end of dayshift yesterday due to development of hypotension on the diltiazem  drip, with blood pressure at the time noted to be 76/54, with heart rates in the 80s.   Following discontinuation of diltiazem  drip, hypotension has improved, with systolic blood pressures increasing from the 70s into the low 100s mmHg.  Additionally, ensuing heart rates remain relatively well-controlled, with ensuing heart rates in the 80s to low 100s following discontinuation of diltiazem  drip.  Additional vital signs thus far tonight notable for afebrile, respiratory rate 16-20, and oxygen saturation in the high 90s on room air.    Eva Pore, DO Hospitalist

## 2024-12-04 NOTE — Progress Notes (Signed)
 The patient's HR is sustaining up >120 after he got up to take his weight this morning. He said he does not feel anything (ie asymptomatic) . Notified Dr. Marcene and received and order for Metoprolol  2.5 mg IV PRN for HR >130 bpm.

## 2024-12-05 ENCOUNTER — Inpatient Hospital Stay (HOSPITAL_COMMUNITY)

## 2024-12-05 ENCOUNTER — Encounter (HOSPITAL_COMMUNITY): Admission: EM | Disposition: A | Payer: Self-pay | Source: Home / Self Care | Attending: Family Medicine

## 2024-12-05 ENCOUNTER — Inpatient Hospital Stay (HOSPITAL_COMMUNITY): Admitting: Anesthesiology

## 2024-12-05 ENCOUNTER — Other Ambulatory Visit (HOSPITAL_COMMUNITY): Payer: Self-pay

## 2024-12-05 ENCOUNTER — Telehealth (HOSPITAL_COMMUNITY): Payer: Self-pay | Admitting: Pharmacy Technician

## 2024-12-05 ENCOUNTER — Telehealth (HOSPITAL_COMMUNITY): Payer: Self-pay

## 2024-12-05 DIAGNOSIS — E785 Hyperlipidemia, unspecified: Secondary | ICD-10-CM | POA: Diagnosis not present

## 2024-12-05 DIAGNOSIS — I4891 Unspecified atrial fibrillation: Secondary | ICD-10-CM | POA: Diagnosis not present

## 2024-12-05 DIAGNOSIS — Z86711 Personal history of pulmonary embolism: Secondary | ICD-10-CM | POA: Diagnosis not present

## 2024-12-05 DIAGNOSIS — I5023 Acute on chronic systolic (congestive) heart failure: Secondary | ICD-10-CM | POA: Diagnosis not present

## 2024-12-05 DIAGNOSIS — I4892 Unspecified atrial flutter: Secondary | ICD-10-CM

## 2024-12-05 DIAGNOSIS — E66811 Obesity, class 1: Secondary | ICD-10-CM | POA: Diagnosis not present

## 2024-12-05 DIAGNOSIS — M109 Gout, unspecified: Secondary | ICD-10-CM | POA: Diagnosis not present

## 2024-12-05 DIAGNOSIS — I10 Essential (primary) hypertension: Secondary | ICD-10-CM | POA: Diagnosis not present

## 2024-12-05 DIAGNOSIS — I484 Atypical atrial flutter: Secondary | ICD-10-CM | POA: Diagnosis not present

## 2024-12-05 LAB — BASIC METABOLIC PANEL WITH GFR
Anion gap: 9 (ref 5–15)
BUN: 18 mg/dL (ref 8–23)
CO2: 25 mmol/L (ref 22–32)
Calcium: 9 mg/dL (ref 8.9–10.3)
Chloride: 104 mmol/L (ref 98–111)
Creatinine, Ser: 0.85 mg/dL (ref 0.61–1.24)
GFR, Estimated: >60 mL/min
Glucose, Bld: 93 mg/dL (ref 70–99)
Potassium: 4.3 mmol/L (ref 3.5–5.1)
Sodium: 138 mmol/L (ref 135–145)

## 2024-12-05 LAB — ECHO TEE

## 2024-12-05 LAB — MAGNESIUM: Magnesium: 2 mg/dL (ref 1.7–2.4)

## 2024-12-05 LAB — GLUCOSE, CAPILLARY: Glucose-Capillary: 91 mg/dL (ref 70–99)

## 2024-12-05 MED ORDER — FUROSEMIDE 10 MG/ML IJ SOLN
40.0000 mg | Freq: Once | INTRAMUSCULAR | Status: AC
  Administered 2024-12-05: 40 mg via INTRAVENOUS
  Filled 2024-12-05: qty 4

## 2024-12-05 MED ORDER — PROPOFOL 10 MG/ML IV BOLUS
INTRAVENOUS | Status: DC | PRN
  Administered 2024-12-05: 120 ug/kg/min via INTRAVENOUS
  Administered 2024-12-05: 80 mg via INTRAVENOUS

## 2024-12-05 NOTE — Plan of Care (Signed)
 Pt going for TEE/DCCV, clinical measurements to improve. All other aspects progressing.

## 2024-12-05 NOTE — Progress Notes (Incomplete)
 Heart Failure Nurse Navigator Progress Note  PCP: Trudy Elodia PARAS, PA-C PCP-Cardiologist: *** Admission Diagnosis: *** Admitted from: ***  Presentation:   Carl [name] presented with ***  ECHO/ LVEF: ***  Clinical Course:  Past Medical History:  Diagnosis Date   Hypercholesteremia    Primary hypertension    Pulmonary embolism (HCC) 08/27/2020   Typical atrial flutter (HCC) 08/27/2020 with PE     Social History   Socioeconomic History   Marital status: Widowed    Spouse name: Not on file   Number of children: 2   Years of education: Not on file   Highest education level: Not on file  Occupational History   Occupation: retired  Tobacco Use   Smoking status: Some Days    Types: Cigars   Smokeless tobacco: Never   Tobacco comments:    Once a week 05/08/22  Vaping Use   Vaping status: Never Used  Substance and Sexual Activity   Alcohol  use: Yes    Alcohol /week: 6.0 - 8.0 standard drinks of alcohol     Types: 6 - 8 Glasses of wine per week    Comment: 3-4 glasses of wine twice a week 05/08/22   Drug use: Yes    Types: Marijuana    Comment: 1 edible nightly 05/08/22   Sexual activity: Not on file  Other Topics Concern   Not on file  Social History Narrative   Not on file   Social Drivers of Health   Tobacco Use: High Risk (12/02/2024)   Patient History    Smoking Tobacco Use: Some Days    Smokeless Tobacco Use: Never    Passive Exposure: Not on file  Financial Resource Strain: Not on file  Food Insecurity: No Food Insecurity (12/02/2024)   Epic    Worried About Programme Researcher, Broadcasting/film/video in the Last Year: Never true    Ran Out of Food in the Last Year: Never true  Transportation Needs: No Transportation Needs (12/02/2024)   Epic    Lack of Transportation (Medical): No    Lack of Transportation (Non-Medical): No  Physical Activity: Not on file  Stress: Not on file  Social Connections: Moderately Integrated (12/02/2024)   Social Connection and Isolation  Panel    Frequency of Communication with Friends and Family: Three times a week    Frequency of Social Gatherings with Friends and Family: Three times a week    Attends Religious Services: 1 to 4 times per year    Active Member of Clubs or Organizations: Yes    Attends Banker Meetings: 1 to 4 times per year    Marital Status: Divorced  Depression (PHQ2-9): Not on file  Alcohol  Screen: Not on file  Housing: Low Risk (12/02/2024)   Epic    Unable to Pay for Housing in the Last Year: No    Number of Times Moved in the Last Year: 0    Homeless in the Last Year: No  Utilities: Not At Risk (12/02/2024)   Epic    Threatened with loss of utilities: No  Health Literacy: Not on file   Education Assessment and Provision:  Detailed education and instructions provided on heart failure disease management including the following:  Signs and symptoms of Heart Failure When to call the physician Importance of daily weights Low sodium diet Fluid restriction Medication management Anticipated future follow-up appointments  Patient education given on each of the above topics.  Patient acknowledges understanding via teach back method and acceptance of all  instructions.  Education Materials:  Living Better With Heart Failure Booklet, HF zone tool, & Daily Weight Tracker Tool.  Patient has scale at home: *** Patient has pill box at home: ***    High Risk Criteria for Readmission and/or Poor Patient Outcomes: Heart failure hospital admissions (last 6 months): ***  No Show rate: *** Difficult social situation: *** Demonstrates medication adherence: *** Primary Language: *** Literacy level: ***  Barriers of Care:   ***  Considerations/Referrals:   Referral made to Heart Failure Pharmacist Stewardship: *** Referral made to Heart Failure CSW/NCM TOC: *** Referral made to Heart & Vascular TOC clinic: ***  Items for Follow-up on DC/TOC: ***   ***

## 2024-12-05 NOTE — CV Procedure (Signed)
" ° °  TRANSESOPHAGEAL ECHOCARDIOGRAM GUIDED DIRECT CURRENT CARDIOVERSION  NAME:  Carl [name]    MRN: [id number] DOB:  1956-06-20    ADMIT DATE: 12/02/2024  INDICATIONS: Symptomatic atrial fibrillation  PROCEDURE:  Informed consent was obtained prior to the procedure. The risks, benefits and alternatives for the procedure were discussed and the patient comprehended these risks.  Risks include, but are not limited to, cough, sore throat, vomiting, nausea, somnolence, esophageal and stomach trauma or perforation, bleeding, low blood pressure, aspiration, pneumonia, infection, trauma to the teeth and death.   After a procedural time-out, the oropharynx was anesthetized and the patient was sedated by the anesthesia service. The transesophageal probe was inserted in the esophagus and stomach without difficulty and multiple views were obtained. Anesthesia was monitored by Dr. Merla.   COMPLICATIONS:   Complications: No complications Patient tolerated procedure well.  KEY FINDINGS: - No LAA clot - Full Report to follow.   CARDIOVERSION:    Indications:  Symptomatic Atrial Fibrillation  Procedure Details: Once the TEE was complete, the patient had the defibrillator pads placed in the anterior and posterior position. Once an appropriate level of sedation was confirmed, the patient was cardioverted x 1 with 200J of biphasic synchronized energy.  The patient converted to NSR.  There were no apparent complications.  The patient had normal neuro status and respiratory status post procedure with vitals stable as recorded elsewhere.  Adequate airway was maintained throughout and vital signs monitored per protocol.  Signed, Joelle HUNT Ren Ny, MD, [facility]  [facility]  12:51 PM  "

## 2024-12-05 NOTE — Progress Notes (Signed)
" ° °  Heart Failure Stewardship Pharmacist Progress Note   PCP: Trudy Elodia PARAS, PA-C PCP-Cardiologist: Newman PARAS Lawrence, MD    HPI:  69 yo M with PMH of HFrEF, NICM, afib, HTN, HLD, COPD, obesity, and PE. 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   Presented to the ED from his PCP office on 1/23 with afib RVR. For the last two weeks, he reported increased fatigue then progressed to shortness of breath and LE edema. Started on diltiazem  drip. ECHO 1/24 with LVEF 25-30% (was 60-65% in 11/2021), global hypokinesis, RV moderately reduced. Diltiazem  stopped with hypotension and low EF.   Plan for TEE/DCCV today.  Current HF Medications: Diuretic: furosemide  40 mg IV x 1 Beta Blocker: metoprolol  tartrate 100 mg BID  Prior to admission HF Medications: ACE/ARB/ARNI: valsartan  160 mg daily *stopped taking metoprolol  because it made him feel weird  Pertinent Lab Values: Serum creatinine 0.85, BUN 18, Potassium 4.3, Sodium 138, proBNP 208, Magnesium  2, A1c 5.6   Vital Signs: Weight: 242 lbs (admission weight: 243 lbs) Blood pressure: 100-120/80s  Heart rate: 110s (afib)  I/O: net -0.3L yesterday; net -1.8L since admission  Medication Assistance / Insurance Benefits Check: Does the patient have prescription insurance?  Yes Type of insurance plan: Medicare / BCBS supplement  Does the patient qualify for medication assistance through manufacturers or grants?   Pending Eligible grants and/or patient assistance programs: pending Medication assistance applications in progress: none  Medication assistance applications approved: none Approved medication assistance renewals will be completed by: pending  Outpatient Pharmacy:  [facility] Is the patient willing to use [facility] TOC pharmacy at discharge? Yes Is the patient willing to  transition their outpatient pharmacy to utilize a [facility] outpatient pharmacy?   Pending    Assessment: 1. Acute on chronic systolic CHF (LVEF 25-30%), due to NICM. NYHA class III symptoms. - Agree with furosemide  40 mg IV x 1. Strict I/Os and daily weights. Keep K>4 and Mg>2. - Continue metoprolol  tartrate 100 mg BID. Will need to consolidate to metoprolol  succinate after cardioversion and prior to discharge.  - BP low - may be able to further optimize GDMT after cardioversion today. - Consider adding Farxiga/Jardiance after cardioversion and this would not have much impact on BP   Plan: 1) Medication changes recommended at this time: - Add SGLT2i after cardioversion   2) Patient assistance: - Farxiga/Jardiance require prior authorization - Eliquis  copay $89  3)  Education  - To be completed prior to discharge  Duwaine Plant, PharmD, BCPS Heart Failure Stewardship Pharmacist Phone [phone]   "

## 2024-12-05 NOTE — Interval H&P Note (Signed)
 History and Physical Interval Note:  12/05/2024 11:02 AM  Carl [name]  has presented today for surgery, with the diagnosis of afib.  The various methods of treatment have been discussed with the patient and family. After consideration of risks, benefits and other options for treatment, the patient has consented to  Procedures: TRANSESOPHAGEAL ECHOCARDIOGRAM (N/A) CARDIOVERSION (N/A) as a surgical intervention.  The patient's history has been reviewed, patient examined, no change in status, stable for surgery.  I have reviewed the patient's chart and labs.  Questions were answered to the patient's satisfaction.     Onnie Alatorre H Azobou Tonleu

## 2024-12-05 NOTE — Anesthesia Postprocedure Evaluation (Addendum)
"   Anesthesia Post Note  Patient: Carl [name]  Procedure(s) Performed: TRANSESOPHAGEAL ECHOCARDIOGRAM CARDIOVERSION     Patient location during evaluation: PACU Anesthesia Type: MAC Level of consciousness: awake and alert, oriented and patient cooperative Pain management: pain level controlled Vital Signs Assessment: post-procedure vital signs reviewed and stable Respiratory status: spontaneous breathing, nonlabored ventilation and respiratory function stable Cardiovascular status: blood pressure returned to baseline and stable Postop Assessment: no apparent nausea or vomiting Anesthetic complications: no   There were no known notable events for this encounter.  Last Vitals:  Vitals:   12/05/24 1037 12/05/24 1315  BP: (!) 122/91 94/73  Pulse: (!) 108 74  Resp: 18 (!) 26  Temp:  36.4 C  SpO2: 95% 91%    Last Pain:  Vitals:   12/05/24 1315  TempSrc: Oral  PainSc: 0-No pain                 [name]      "

## 2024-12-05 NOTE — Progress Notes (Addendum)
 "  Progress Note  Patient Name: Carl [name] Date of Encounter: 12/05/2024 [facility] Cardiologist: None   Interval Summary   Doing well.  Denied chest pain or palpitations. Some shortness of breath at night though no DOE  Vital Signs Vitals:   12/04/24 2104 12/05/24 0031 12/05/24 0526 12/05/24 0718  BP: 104/81 (!) 103/90 (!) 107/92 102/88  Pulse: (!) 119 95 95 (!) 132  Resp:  20 19 16   Temp:  98.2 F (36.8 C) 98.3 F (36.8 C) 98 F (36.7 C)  TempSrc:  Oral Oral Oral  SpO2:  91% 99%   Weight:   110.1 kg   Height:        Intake/Output Summary (Last 24 hours) at 12/05/2024 0753 Last data filed at 12/04/2024 2109 Gross per 24 hour  Intake 240 ml  Output 525 ml  Net -285 ml      12/05/2024    5:26 AM 12/04/2024    5:44 AM 12/03/2024    4:56 AM  Last 3 Weights  Weight (lbs) 242 lb 11.6 oz 243 lb 4.8 oz 241 lb 3.2 oz  Weight (kg) 110.1 kg 110.36 kg 109.408 kg      Telemetry/ECG  AFL with variable conduction HR 125 - Personally Reviewed  Physical Exam  GEN: No acute distress.   Neck: No JVD Cardiac: Irregularly, irregular, no murmurs, rubs, or gallops.  Respiratory: Clear to auscultation bilaterally. GI: Soft, nontender, distended, abdominal incision MS: 1-2+ pitting edema  Assessment & Plan  Carl [name] is a 69 y.o. male with a hx of hypertension, hyperlipidemia, COPD, HFimpEF, and AFL s/p ablation who presented to the ED for tachycardia and fatigue. ECG showed AFL with variable conduction VR 111. Echo showed LVEF 25 -30% with global hypokinesis. RV function moderately reduced. Bi-atrial enlargement.   Atrial Flutter, with episode of RVR Currently in AFL with variable conduction HR ~125 Chad Vas score 3 Keep K > 4 and Mag > 2  Patient underwent ablation in 2023. Through SDM chronic anticoagulation was not pursued.  This admission rate control has been difficult with his LV dysfunction precluding CCB use. Hypotension limits further BB titration. Did  receive digoxin  yesterday for added rate control.  TEE/DCCV planned for today. To remain NPO until after.  Will most likely need re-do ablation, though can be discussed outpatient.   Continue eliquis  5 mg  Continue metoprolol  tartrate 100 mg BID, will need to consolidate at discharge  Acute on Chronic HFrEF Etiology tachy-mediated. Last EF drop 2022 in the setting of AFL, had EF recovery once rhythm control was achieved.  Received IV diuresis. Net IO Since Admission: -1,799.45 mL [12/05/24 0812] On exam, appears volume up though with stable renal function and hypotension will hold on diuresis this morning.   Rhythm control as above GDMT has been limited by hypotension as we attempted to reach rate control, should be able to add GDMT after DCCV today.   Hypertension BP: 102/88 Medications as above  Hyperlipidemia LDL 44   HDL 40 Continue lipitor 10 mg  Otherwise per primary    For questions or updates, please contact [facility] Please consult [email or web address] for contact info under       Signed, Leontine LOISE Salen, PA-C    History and all data above reviewed.  I personally took the history today, performed the physical exam and formulated the substantive portion of the assessment and plan.  I reviewed all relevant tests and studies. Patient examined.  I agree with  the findings as above.  He is now status post DCCV and feels better.  No pain.  Breathing improved. The patient exam reveals COR:RRR  ,  Lungs: Clear  ,  Abd: Positive bowel sounds, no rebound no guarding, Ext Mild edema   .  All available labs, radiology testing, previous records reviewed. Agree with documented assessment and plan. Atrial flutter:  DCCV/TEE today.  Now in NSR.    Acute systolic HF:  Limited on titrating meds with hypotension.  Plan is to reassess after DCCV.  Holding diuresis as he seems to be euvolemic and is hypotensive.      Lynwood Schilling  10:35 AM  12/05/2024 "

## 2024-12-05 NOTE — Plan of Care (Signed)
   Problem: Education: Goal: Knowledge of General Education information will improve Description Including pain rating scale, medication(s)/side effects and non-pharmacologic comfort measures Outcome: Progressing   Problem: Clinical Measurements: Goal: Ability to maintain clinical measurements within normal limits will improve Outcome: Progressing   Problem: Activity: Goal: Risk for activity intolerance will decrease Outcome: Progressing

## 2024-12-05 NOTE — Telephone Encounter (Signed)
 Patient Product/process Development Scientist completed.    The patient is insured through [employer]. Patient has [employer], may use a copay card, and/or apply for patient assistance if available.    Ran test claim for Farixga 10 mg and Requires Prior Authorization  Ran test claim for Jardiance 10 mg and Requires Prior Authorization   This test claim was processed through [employer]- copay amounts may vary at other pharmacies due to [employer], or as the patient moves through the different stages of their insurance plan.     Reyes Sharps, CPHT Pharmacy Technician Patient Advocate Specialist Lead [facility] Patient Advocate Team Direct Number: [phone]  Fax: [phone]

## 2024-12-05 NOTE — Progress Notes (Addendum)
 " Progress Note   Patient: Carl [name] FMW:[id number] DOB: November 18, 1955 DOA: 12/02/2024     2 DOS: the patient was seen and examined on 12/05/2024   Brief hospital course: Carl [name] was admitted to the hospital with the working diagnosis of atrial fibrillation.   69 year old male past medical history of paroxysmal atrial fibrillation, hypertension, hyperlipidemia, ,COPD, heart failure, obesity and pulmonary embolism who presented with dyspnea.  Reported progressive dyspnea, fatigue, generalized weakness, palpitation and weight gain for past 2 weeks prior to admission. Because persistent symptoms EMS was called, patient was found in atrial fibrillation with rapid ventricular response, 150 to 200 bpm, with blood pressure systolic of 122 mmHg. He had 35 mg of diltiazem  with no response than he was transported to the ED.  On his initial physical examination in the ED his blood pressure was 125/82, RR 27, HR 150 and 02 saturation 98% Lungs with no wheezing or rhonchi, heart with S1 and S2 present and tachycardic, irregularly irregular, abdomen with no distention and positive lower extremity edema ++  Na 141, K 4.4 Cl 108 bicarbonate 22, glucose 81 bun 13 cr 0,83  Mg 1,8  Pro BNP 232  High sensitive troponin 14 and 13  Wbc 6.6 hgb 14.0 plt 148  TSH 1,56   Chest radiograph with hypoinflation, positive cardiomegaly, mild bilateral hilar vascular congestion, no effusions or infiltrates.   EKG 111 bpm, right axis deviation, right bundle branch block, qtc 495, atrial flutter rhythm with variable block, no significant ST segment or  T wave changes.   Patient was placed on diltiazem  infusion for rate control, 01/25 continue with rapid ventricular response, plan for direct current cardioversion tomorrow  01/26 plan for cardioversion today (direct current)  Assessment and Plan: * Acute on chronic systolic (congestive) heart failure (HCC) Echocardiogram with reduced LV systolic function 25 to 30%, global  hypokinesis, mild dilatation of LV cavity, RV systolic function with moderate reduction, RVSP 25.7 mmHg, LA and RA with moderate dilatation, no significant valvular dysfunction   Urine output 525 ml Systolic blood pressure 100 mmHg range  Continue metoprolol  100 mg bid To consider adding spironolactone  and SGLT 2 inh  Today with sings of volume overload will add one dose of furosemide     Improved blood pressure, possible addition of after load reduction post cardioversion   Atypical atrial flutter (HCC) Atrial fibrillation  Patient continue to have rapid ventricular response.  Heart rate 130, did not tolerate diltiazem  due to hypotension.   Plan to continue metoprolol  100 mg po bid Sp digoxin  0.25 mg q 4 hrs x 3  Continue anticoagulation with apixaban  Continue telemetry monitoring   Today K is 4.3 and Mg 2.0 Keep Mg at 2 and K 4, will add 2 g Mg IV  and follow up electrolytes and renal function in am.   Direct cardioversion today   Essential hypertension Hypotension Continue to hold on valsartan .  Continue blood pressure monitoring   Dyslipidemia Continue atorvastatin   History of pulmonary embolism Remote history of PE in 2021 Continue anticoagulation with apixaban    Gout No acute attack Continue allopurinol   Obesity, class 1 Calculated BMI is 34.6         Subjective: Patient with no chest pain or dyspnea, positive lower extremity edema but no PND   Physical Exam: Vitals:   12/05/24 0031 12/05/24 0526 12/05/24 0718 12/05/24 0854  BP: (!) 103/90 (!) 107/92 102/88 112/81  Pulse: 95 95 (!) 132 (!) 138  Resp: 20 19 16  17  Temp: 98.2 F (36.8 C) 98.3 F (36.8 C) 98 F (36.7 C) 98 F (36.7 C)  TempSrc: Oral Oral Oral Oral  SpO2: 91% 99%  94%  Weight:  110.1 kg    Height:       Neurology awake and alert  ENT with mild pallor with no icterus Cardiovascular with S1 and S2 present and irregular, tachycardic with no gallops or rubs, no murmurs Respiratory  with mild rales at bases with no wheezing Abdomen with no distention  Positive lower extremity edema +  Data Reviewed:    Family Communication: no family at the bedside   Disposition: Status is: Inpatient Remains inpatient appropriate because: Direct current cardioversion   Planned Discharge Destination: Home     Author: Elidia Toribio Furnace, MD 12/05/2024 9:27 AM  For on call review [email or web address].  "

## 2024-12-05 NOTE — Transfer of Care (Signed)
 Immediate Anesthesia Transfer of Care Note  Patient: Carlin Mire  Procedure(s) Performed: TRANSESOPHAGEAL ECHOCARDIOGRAM CARDIOVERSION  Patient Location: PACU and Cath Lab  Anesthesia Type:MAC  Level of Consciousness: awake and alert   Airway & Oxygen Therapy: Patient Spontanous Breathing  Post-op Assessment: Report given to RN and Post -op Vital signs reviewed and stable  Post vital signs: Reviewed and stable  Last Vitals:  Vitals Value Taken Time  BP 94/73 1310  Temp    Pulse 74 12/05/24 13:10  Resp 29 12/05/24 13:10  SpO2 91 % 12/05/24 13:10  Vitals shown include unfiled device data.  Last Pain:  Vitals:   12/05/24 1037  TempSrc:   PainSc: 0-No pain      Patients Stated Pain Goal: 0 (12/02/24 2242)  Complications: There were no known notable events for this encounter.

## 2024-12-05 NOTE — Anesthesia Preprocedure Evaluation (Addendum)
"                                    Anesthesia Evaluation  Patient identified by MRN, date of birth, ID band Patient awake    Reviewed: Allergy & Precautions, H&P , NPO status , Patient's Chart, lab work & pertinent test results, reviewed documented beta blocker date and time   Airway Mallampati: III  TM Distance: >3 FB Neck ROM: Full    Dental  (+) Teeth Intact, Dental Advisory Given   Pulmonary Current Smoker, PE (2021) Quit smoking 2 years ago Albuterol  PRN- uses nightly Snores at night, no sleep study    Pulmonary exam normal breath sounds clear to auscultation       Cardiovascular hypertension, Pt. on medications and Pt. on home beta blockers +CHF  Normal cardiovascular exam+ dysrhythmias Atrial Fibrillation  Rhythm:Regular Rate:Normal     Neuro/Psych negative neurological ROS  negative psych ROS   GI/Hepatic negative GI ROS,,,(+)     substance abuse (edibles not daily, alcohol  use 4x/wk)  alcohol  use and marijuana use  Endo/Other  negative endocrine ROS    Renal/GU negative Renal ROS  negative genitourinary   Musculoskeletal negative musculoskeletal ROS (+)    Abdominal   Peds negative pediatric ROS (+)  Hematology negative hematology ROS (+) Hb 14.2, plt 176   Anesthesia Other Findings   Reproductive/Obstetrics negative OB ROS                              Anesthesia Physical Anesthesia Plan  ASA: 3  Anesthesia Plan: General   Post-op Pain Management:    Induction:   PONV Risk Score and Plan: 2 and Propofol  infusion and TIVA  Airway Management Planned: Natural Airway and Simple Face Mask  Additional Equipment: None  Intra-op Plan:   Post-operative Plan:   Informed Consent: I have reviewed the patients History and Physical, chart, labs and discussed the procedure including the risks, benefits and alternatives for the proposed anesthesia with the patient or authorized representative who has indicated  his/her understanding and acceptance.       Plan Discussed with: CRNA  Anesthesia Plan Comments:          Anesthesia Quick Evaluation  "

## 2024-12-05 NOTE — Telephone Encounter (Signed)
 Pharmacy Patient Advocate Encounter  Insurance verification completed.    The patient is insured through [facility]. Patient has [employer], may use a copay card, and/or apply for patient assistance if available.    Ran test claim for Eliquis  5mg  tablet and the current 30 day co-pay is $89.86.   This test claim was processed through [facility]- copay amounts may vary at other pharmacies due to pharmacy/plan contracts, or as the patient moves through the different stages of their insurance plan.

## 2024-12-06 ENCOUNTER — Encounter (HOSPITAL_COMMUNITY): Payer: Self-pay | Admitting: Family Medicine

## 2024-12-06 ENCOUNTER — Other Ambulatory Visit (HOSPITAL_COMMUNITY): Payer: Self-pay

## 2024-12-06 DIAGNOSIS — I10 Essential (primary) hypertension: Secondary | ICD-10-CM | POA: Diagnosis not present

## 2024-12-06 DIAGNOSIS — I5023 Acute on chronic systolic (congestive) heart failure: Secondary | ICD-10-CM | POA: Diagnosis not present

## 2024-12-06 DIAGNOSIS — I484 Atypical atrial flutter: Secondary | ICD-10-CM | POA: Diagnosis not present

## 2024-12-06 DIAGNOSIS — E785 Hyperlipidemia, unspecified: Secondary | ICD-10-CM | POA: Diagnosis not present

## 2024-12-06 LAB — BASIC METABOLIC PANEL WITH GFR
Anion gap: 9 (ref 5–15)
BUN: 16 mg/dL (ref 8–23)
CO2: 27 mmol/L (ref 22–32)
Calcium: 9 mg/dL (ref 8.9–10.3)
Chloride: 105 mmol/L (ref 98–111)
Creatinine, Ser: 0.94 mg/dL (ref 0.61–1.24)
GFR, Estimated: >60 mL/min
Glucose, Bld: 86 mg/dL (ref 70–99)
Potassium: 4.2 mmol/L (ref 3.5–5.1)
Sodium: 140 mmol/L (ref 135–145)

## 2024-12-06 LAB — MAGNESIUM: Magnesium: 1.9 mg/dL (ref 1.7–2.4)

## 2024-12-06 LAB — GLUCOSE, CAPILLARY: Glucose-Capillary: 86 mg/dL (ref 70–99)

## 2024-12-06 MED ORDER — LOSARTAN POTASSIUM 25 MG PO TABS
25.0000 mg | ORAL_TABLET | Freq: Every day | ORAL | Status: DC
  Administered 2024-12-06: 25 mg via ORAL
  Filled 2024-12-06: qty 1

## 2024-12-06 MED ORDER — METOPROLOL SUCCINATE ER 100 MG PO TB24
100.0000 mg | ORAL_TABLET | Freq: Every day | ORAL | 0 refills | Status: DC
  Filled 2024-12-06: qty 30, 30d supply, fill #0

## 2024-12-06 MED ORDER — LOSARTAN POTASSIUM 25 MG PO TABS
25.0000 mg | ORAL_TABLET | Freq: Every day | ORAL | 0 refills | Status: AC
  Filled 2024-12-06: qty 30, 30d supply, fill #0

## 2024-12-06 MED ORDER — APIXABAN 5 MG PO TABS
5.0000 mg | ORAL_TABLET | Freq: Two times a day (BID) | ORAL | 0 refills | Status: DC
  Filled 2024-12-06: qty 60, 30d supply, fill #0

## 2024-12-06 MED ORDER — METOPROLOL SUCCINATE ER 100 MG PO TB24
100.0000 mg | ORAL_TABLET | Freq: Every day | ORAL | Status: DC
  Administered 2024-12-06: 100 mg via ORAL
  Filled 2024-12-06: qty 1

## 2024-12-06 NOTE — Discharge Summary (Signed)
 " Physician Discharge Summary   Patient: Carl [name] MRN: [id number] DOB: 07-Jun-1956  Admit date:     12/02/2024  Discharge date: 12/06/24  Discharge Physician: Elidia Sieving Daron Breeding   PCP: Trudy Elodia PARAS, PA-C   Recommendations at discharge:    Metoprolol  was increased to 100 mg succinate, and ARB was changed to low dose losartan  25 mg. To evaluate as outpatient starting SGLT 2 inh and spironolactone , for guideline directed medical therapy for heart failure.  Continue anticoagulation with apixaban .  Follow up renal function and electrolytes in 7 days as outpatient Follow up with Reena Trudy PA -C in 7 to 10 days.  Follow up with Cardiology as scheduled   Discharge Diagnoses: Principal Problem:   Acute on chronic systolic (congestive) heart failure (HCC) Active Problems:   Atypical atrial flutter (HCC)   Essential hypertension   Dyslipidemia   History of pulmonary embolism   Gout   Obesity, class 1  Resolved Problems:   * No resolved hospital problems. [facility] Course: Carl [name] was admitted to the hospital with the working diagnosis of atrial fibrillation.   69 year old male past medical history of paroxysmal atrial fibrillation, hypertension, hyperlipidemia, ,COPD, heart failure, obesity and pulmonary embolism who presented with dyspnea.  Reported progressive dyspnea, fatigue, generalized weakness, palpitation and weight gain for past 2 weeks prior to admission. Because persistent symptoms EMS was called, patient was found in atrial fibrillation with rapid ventricular response, 150 to 200 bpm, with blood pressure systolic of 122 mmHg. He had 35 mg of diltiazem  with no response than he was transported to the ED.  On his initial physical examination in the ED his blood pressure was 125/82, RR 27, HR 150 and 02 saturation 98% Lungs with no wheezing or rhonchi, heart with S1 and S2 present and tachycardic, irregularly irregular, abdomen with no distention and  positive lower extremity edema ++  Na 141, K 4.4 Cl 108 bicarbonate 22, glucose 81 bun 13 cr 0,83  Mg 1,8  Pro BNP 232  High sensitive troponin 14 and 13  Wbc 6.6 hgb 14.0 plt 148  TSH 1,56   Chest radiograph with hypoinflation, positive cardiomegaly, mild bilateral hilar vascular congestion, no effusions or infiltrates.   EKG 111 bpm, right axis deviation, right bundle branch block, qtc 495, atrial flutter rhythm with variable block, no significant ST segment or  T wave changes.   Patient was placed on diltiazem  infusion for rate control, but developed hypotension, he had digoxin  and metoprolol   01/25 continue with rapid ventricular response, plan for direct current cardioversion tomorrow  01/26 successful direct current cardioversion. 01/27 sinus rhythm   Assessment and Plan: * Acute on chronic systolic (congestive) heart failure (HCC) Echocardiogram with reduced LV systolic function 25 to 30%, global hypokinesis, mild dilatation of LV cavity, RV systolic function with moderate reduction, RVSP 25.7 mmHg, LA and RA with moderate dilatation, no significant valvular dysfunction   Patient was placed on IV furosemide  for diuresis, negative fluid balance was achieved, - 4,749 ml, with significant improvement in his symptoms   Plan to continue with metoprolol  100 mg bid and losartan .  Evaluate as outpatient for SGLT 2 inh and spironolactone    Atypical atrial flutter (HCC) Atrial fibrillation  Patient was placed on AV blockade, with metoprolol  and digoxin  He did not tolerate diltiazem .   01/26 direct current cardioversion with successful conversion to sinus rhythm.   Plan to continue metoprolol  100 mg po bid Continue anticoagulation with apixaban    Essential hypertension Episodic  Hypotension Patient will continue ARB with low dose losartan , titration as outpatient   Dyslipidemia Continue atorvastatin   History of pulmonary embolism Remote history of PE in 2021 Continue  anticoagulation with apixaban    Gout No acute attack Continue allopurinol   Obesity, class 1 Calculated BMI is 34.6        Consultants: Cardiology  Procedures performed: TEE direct current cardioversion   Disposition: Home Diet recommendation:  Cardiac diet DISCHARGE MEDICATION: Allergies as of 12/06/2024   No Known Allergies      Medication List     STOP taking these medications    azithromycin  250 MG tablet Commonly known as: ZITHROMAX    guaiFENesin  100 MG/5ML liquid Commonly known as: ROBITUSSIN   valsartan  160 MG tablet Commonly known as: DIOVAN        TAKE these medications    albuterol  108 (90 Base) MCG/ACT inhaler Commonly known as: VENTOLIN  HFA Inhale 1-2 puffs into the lungs every 6 (six) hours as needed for wheezing or shortness of breath.   allopurinol  100 MG tablet Commonly known as: ZYLOPRIM  Take 100 mg by mouth in the morning.   apixaban  5 MG Tabs tablet Commonly known as: ELIQUIS  Take 1 tablet (5 mg total) by mouth 2 (two) times daily.   atorvastatin  10 MG tablet Commonly known as: LIPITOR Take 10 mg by mouth in the morning.   losartan  25 MG tablet Commonly known as: COZAAR  Take 1 tablet (25 mg total) by mouth daily.   MAGNESIUM  PO Take by mouth.   metoprolol  succinate 100 MG 24 hr tablet Commonly known as: TOPROL -XL Take 1 tablet (100 mg total) by mouth daily. Take with or immediately following a meal. What changed:  medication strength how much to take additional instructions   multivitamin with minerals Tabs tablet Take 1 tablet by mouth daily. Centrum Silver for Men 50+   RA Probiotic Digestive Care Caps Take 1 capsule by mouth in the morning.   tadalafil 20 MG tablet Commonly known as: CIALIS Take 20 mg by mouth daily as needed for erectile dysfunction.        Discharge Exam: Filed Weights   12/04/24 0544 12/05/24 0526 12/06/24 0521  Weight: 110.4 kg 110.1 kg 109.3 kg   BP 123/85 (BP Location: Left Arm)    Pulse 75   Temp 98 F (36.7 C) (Oral)   Resp 16   Ht 5' 10 (1.778 m)   Wt 109.3 kg   SpO2 98%   BMI 34.58 kg/m   Patient with no chest pain and no dyspnea, no palpitations, no orthopnea, lower extremity edema or PND  Neurology awake and alert ENT with mild pallor with no icterus Cardiovascular with S1 and S2 present and regular with no gallops, rubs or murmurs No JVD Respiratory with mild rales at bases with no wheezing or rhonchi Abdomen protuberant, soft and not distended No lower extremity edema   Condition at discharge: stable  The results of significant diagnostics from this hospitalization (including imaging, microbiology, ancillary and laboratory) are listed below for reference.   Imaging Studies: ECHO TEE Result Date: 12/05/2024    TRANSESOPHOGEAL ECHO REPORT   Patient Name:   Carl [name] Date of Exam: 12/05/2024 Medical Rec #:  [id number]     Height:       70.0 in Accession #:    [id number]    Weight:       242.7 lb Date of Birth:  08-Aug-1956     BSA:  2.266 m Patient Age:    68 years      BP:           131/102 mmHg Patient Gender: M             HR:           154 bpm. Exam Location:  Inpatient Procedure: Transesophageal Echo, Cardiac Doppler and Color Doppler (Both            Spectral and Color Flow Doppler were utilized during procedure). Indications:    Arrhythmia  History:        Patient has prior history of Echocardiogram examinations, most                 recent 12/03/2024. Arrythmias:Atrial Flutter.  Sonographer:    Jayson Gaskins Referring Phys: [id number] [facility] H AZOBOU TONLEU PROCEDURE: The transesophogeal probe was passed without difficulty through the esophogus of the patient. Sedation performed by different physician. The patient was monitored while under deep sedation. Anesthestetic sedation was provided intravenously by Anesthesiology: 160mg  of Propofol . The patient developed Respiratory depression during the procedure. A successful direct current cardioversion  was performed at 200 joules with 1 attempt.  IMPRESSIONS  1. Left ventricular ejection fraction, by estimation, is 25 to 30%. The left ventricle has severely decreased function.  2. Right ventricular systolic function is moderately reduced. The right ventricular size is normal.  3. Left atrial size was mildly dilated. No left atrial/left atrial appendage thrombus was detected.  4. The mitral valve is grossly normal. Trivial mitral valve regurgitation.  5. The aortic valve is grossly normal. Aortic valve regurgitation is not visualized. FINDINGS  Left Ventricle: Left ventricular ejection fraction, by estimation, is 25 to 30%. The left ventricle has severely decreased function. The left ventricular internal cavity size was normal in size. Right Ventricle: The right ventricular size is normal. No increase in right ventricular wall thickness. Right ventricular systolic function is moderately reduced. Left Atrium: Left atrial size was mildly dilated. No left atrial/left atrial appendage thrombus was detected. Right Atrium: Right atrial size was normal in size. Pericardium: There is no evidence of pericardial effusion. Mitral Valve: The mitral valve is grossly normal. Trivial mitral valve regurgitation. Tricuspid Valve: The tricuspid valve is grossly normal. Tricuspid valve regurgitation is trivial. Aortic Valve: The aortic valve is grossly normal. Aortic valve regurgitation is not visualized. Pulmonic Valve: The pulmonic valve was not well visualized. Pulmonic valve regurgitation is not visualized. Aorta: Aortic root could not be assessed. IAS/Shunts: No atrial level shunt detected by color flow Doppler. Joelle Cedars Tonleu Electronically signed by Joelle Cedars Ny Signature Date/Time: 12/05/2024/2:12:25 PM    Final    EP STUDY Result Date: 12/05/2024 See surgical note for result.  ECHOCARDIOGRAM COMPLETE Result Date: 12/03/2024    ECHOCARDIOGRAM REPORT   Patient Name:   Carl [name] Date of Exam: 12/03/2024  Medical Rec #:  [id number]     Height:       70.0 in Accession #:    [id number]    Weight:       241.2 lb Date of Birth:  February 19, 1956     BSA:          2.260 m Patient Age:    68 years      BP:           122/89 mmHg Patient Gender: M             HR:  115 bpm. Exam Location:  Inpatient Procedure: 2D Echo, Cardiac Doppler and Color Doppler (Both Spectral and Color            Flow Doppler were utilized during procedure). Indications:    AFIB I48.91  History:        Patient has prior history of Echocardiogram examinations, most                 recent 12/05/2021. Signs/Symptoms:Hypertensive Heart Disease.  Sonographer:    Nathanel Devonshire Referring Phys: [id number] SEYED A SHAHMEHDI IMPRESSIONS  1. Left ventricular ejection fraction, by estimation, is 25 to 30%. The left ventricle has severely decreased function. The left ventricle demonstrates global hypokinesis. The left ventricular internal cavity size was mildly dilated. Left ventricular diastolic function could not be evaluated.  2. Right ventricular systolic function is moderately reduced. The right ventricular size is normal. There is normal pulmonary artery systolic pressure. The estimated right ventricular systolic pressure is 25.7 mmHg.  3. Left atrial size was moderately dilated.  4. Right atrial size was moderately dilated.  5. The mitral valve is normal in structure. No evidence of mitral valve regurgitation.  6. The aortic valve is tricuspid. Aortic valve regurgitation is not visualized. No aortic stenosis is present. Comparison(s): Prior images reviewed side by side. The left ventricular function is significantly worse. FINDINGS  Left Ventricle: Left ventricular ejection fraction, by estimation, is 25 to 30%. The left ventricle has severely decreased function. The left ventricle demonstrates global hypokinesis. The left ventricular internal cavity size was mildly dilated. There is no left ventricular hypertrophy. Left ventricular diastolic function could not  be evaluated due to atrial fibrillation. Left ventricular diastolic function could not be evaluated. Right Ventricle: The right ventricular size is normal. Right vetricular wall thickness was not well visualized. Right ventricular systolic function is moderately reduced. There is normal pulmonary artery systolic pressure. The tricuspid regurgitant velocity is 1.98 m/s, and with an assumed right atrial pressure of 10 mmHg, the estimated right ventricular systolic pressure is 25.7 mmHg. Left Atrium: Left atrial size was moderately dilated. Right Atrium: Right atrial size was moderately dilated. Pericardium: There is no evidence of pericardial effusion. Mitral Valve: The mitral valve is normal in structure. Mild mitral annular calcification. No evidence of mitral valve regurgitation. Tricuspid Valve: The tricuspid valve is normal in structure. Tricuspid valve regurgitation is mild. Aortic Valve: The aortic valve is tricuspid. Aortic valve regurgitation is not visualized. No aortic stenosis is present. Aortic valve mean gradient measures 2.0 mmHg. Aortic valve peak gradient measures 3.8 mmHg. Aortic valve area, by VTI measures 2.19 cm. Pulmonic Valve: The pulmonic valve was grossly normal. Pulmonic valve regurgitation is not visualized. No evidence of pulmonic stenosis. Aorta: The aortic root and ascending aorta are structurally normal, with no evidence of dilitation. Venous: The inferior vena cava was not well visualized. IAS/Shunts: The interatrial septum was not well visualized.  LEFT VENTRICLE PLAX 2D LVIDd:         5.80 cm LVIDs:         4.40 cm LV PW:         1.00 cm LV IVS:        1.00 cm LVOT diam:     2.00 cm LV SV:         37 LV SV Index:   16 LVOT Area:     3.14 cm  LV Volumes (MOD) LV vol d, MOD A2C: 64.2 ml LV vol d, MOD A4C: 71.4 ml LV vol s,  MOD A2C: 40.3 ml LV vol s, MOD A4C: 44.3 ml LV SV MOD A2C:     23.9 ml LV SV MOD A4C:     71.4 ml LV SV MOD BP:      26.3 ml RIGHT VENTRICLE RV Basal diam:  3.40 cm  TAPSE (M-mode): 1.5 cm LEFT ATRIUM           Index LA diam:      4.40 cm 1.95 cm/m LA Vol (A4C): 60.8 ml 26.90 ml/m  AORTIC VALVE                    PULMONIC VALVE AV Area (Vmax):    2.29 cm     PV Vmax:       0.66 m/s AV Area (Vmean):   2.16 cm     PV Peak grad:  1.7 mmHg AV Area (VTI):     2.19 cm AV Vmax:           97.80 cm/s AV Vmean:          68.800 cm/s AV VTI:            0.169 m AV Peak Grad:      3.8 mmHg AV Mean Grad:      2.0 mmHg LVOT Vmax:         71.30 cm/s LVOT Vmean:        47.300 cm/s LVOT VTI:          0.118 m LVOT/AV VTI ratio: 0.70  AORTA Ao Root diam: 3.60 cm Ao Asc diam:  3.70 cm TRICUSPID VALVE TR Peak grad:   15.7 mmHg TR Vmax:        198.00 cm/s  SHUNTS Systemic VTI:  0.12 m Systemic Diam: 2.00 cm Jerel Croitoru MD Electronically signed by Jerel Balding MD Signature Date/Time: 12/03/2024/2:16:40 PM    Final    DG Chest Port 1 View Result Date: 12/02/2024 CLINICAL DATA:  Shortness of breath EXAM: PORTABLE CHEST 1 VIEW COMPARISON:  03/23/2023 FINDINGS: Mildly degraded exam due to AP portable technique and patient body habitus. Numerous leads and wires project over the chest. Apical lordotic positioning. Midline trachea. Cardiomegaly accentuated by AP portable technique. No pleural effusion or pneumothorax. Suspect subsegmental atelectasis at both lung bases. No congestive failure. IMPRESSION: Cardiomegaly and low lung volumes. No acute findings. Electronically Signed   By: Rockey Kilts M.D.   On: 12/02/2024 14:54    Microbiology: Results for orders placed or performed during the hospital encounter of 12/05/21  Resp Panel by RT-PCR (Flu A&B, Covid) Nasopharyngeal Swab     Status: None   Collection Time: 12/05/21  7:05 AM   Specimen: Nasopharyngeal Swab; Nasopharyngeal(NP) swabs in vial transport medium  Result Value Ref Range Status   SARS Coronavirus 2 by RT PCR NEGATIVE NEGATIVE Final    Comment: (NOTE) SARS-CoV-2 target nucleic acids are NOT DETECTED.  The SARS-CoV-2 RNA  is generally detectable in upper respiratory specimens during the acute phase of infection. The lowest concentration of SARS-CoV-2 viral copies this assay can detect is 138 copies/mL. A negative result does not preclude SARS-Cov-2 infection and should not be used as the sole basis for treatment or other patient management decisions. A negative result may occur with  improper specimen collection/handling, submission of specimen other than nasopharyngeal swab, presence of viral mutation(s) within the areas targeted by this assay, and inadequate number of viral copies(<138 copies/mL). A negative result must be combined with clinical observations, patient history, and epidemiological  information. The expected result is Negative.  Fact Sheet for Patients:  bloggercourse.com  Fact Sheet for Healthcare Providers:  seriousbroker.it  This test is no t yet approved or cleared by the [place]  FDA and  has been authorized for detection and/or diagnosis of SARS-CoV-2 by FDA under an Emergency Use Authorization (EUA). This EUA will remain  in effect (meaning this test can be used) for the duration of the COVID-19 declaration under Section 564(b)(1) of the Act, 21 U.S.C.section 360bbb-3(b)(1), unless the authorization is terminated  or revoked sooner.       Influenza A by PCR NEGATIVE NEGATIVE Final   Influenza B by PCR NEGATIVE NEGATIVE Final    Comment: (NOTE) The Xpert Xpress SARS-CoV-2/FLU/RSV plus assay is intended as an aid in the diagnosis of influenza from Nasopharyngeal swab specimens and should not be used as a sole basis for treatment. Nasal washings and aspirates are unacceptable for Xpert Xpress SARS-CoV-2/FLU/RSV testing.  Fact Sheet for Patients: bloggercourse.com  Fact Sheet for Healthcare Providers: seriousbroker.it  This test is not yet approved or cleared by the  [place]  FDA and has been authorized for detection and/or diagnosis of SARS-CoV-2 by FDA under an Emergency Use Authorization (EUA). This EUA will remain in effect (meaning this test can be used) for the duration of the COVID-19 declaration under Section 564(b)(1) of the Act, 21 U.S.C. section 360bbb-3(b)(1), unless the authorization is terminated or revoked.  Performed at [facility], [address] [place], [place] [address]     Labs: CBC: Recent Labs  Lab 12/02/24 1427 12/03/24 0432 12/04/24 0355  WBC 6.6 6.1 5.6  NEUTROABS 4.4  --   --   HGB 14.0 14.0 14.2  HCT 42.1 42.1 42.1  MCV 102.4* 102.4* 102.9*  PLT 148* 152 176   Basic Metabolic Panel: Recent Labs  Lab 12/02/24 1427 12/02/24 1940 12/03/24 0432 12/04/24 0355 12/05/24 0232 12/06/24 0404  NA 141  --  141 137 138 140  K 4.4  --  4.0 4.4 4.3 4.2  CL 108  --  108 106 104 105  CO2 22  --  22 24 25 27   GLUCOSE 81  --  124* 94 93 86  BUN 13  --  16 19 18 16   CREATININE 0.83  --  0.88 0.82 0.85 0.94  CALCIUM  9.1  --  9.3 9.0 9.0 9.0  MG 1.8 2.1  --  1.9 2.0 1.9  PHOS  --  3.0  --   --   --   --    Liver Function Tests: Recent Labs  Lab 12/02/24 1427  AST 30  ALT 25  ALKPHOS 71  BILITOT 1.0  PROT 6.0*  ALBUMIN 3.4*   CBG: Recent Labs  Lab 12/05/24 0749 12/06/24 0829  GLUCAP 91 86    Discharge time spent: greater than 30 minutes.  Signed: Elidia Toribio Furnace, MD [facility] 12/06/2024 "

## 2024-12-06 NOTE — Care Management Important Message (Signed)
 Important Message  Patient Details  Name: Carl [name] MRN: [id number] Date of Birth: [birth year]   Important Message Given:  Yes - Medicare IM     Carl [name] 12/06/2024, 12:51 PM

## 2024-12-06 NOTE — Plan of Care (Signed)
" °  Problem: Education: Goal: Knowledge of General Education information will improve Description: Including pain rating scale, medication(s)/side effects and non-pharmacologic comfort measures Outcome: Adequate for Discharge   Problem: Health Behavior/Discharge Planning: Goal: Ability to manage health-related needs will improve Outcome: Adequate for Discharge   Problem: Clinical Measurements: Goal: Ability to maintain clinical measurements within normal limits will improve Outcome: Adequate for Discharge Goal: Diagnostic test results will improve Outcome: Adequate for Discharge Goal: Respiratory complications will improve Outcome: Adequate for Discharge Goal: Cardiovascular complication will be avoided Outcome: Adequate for Discharge   Problem: Activity: Goal: Risk for activity intolerance will decrease Outcome: Adequate for Discharge   Problem: Coping: Goal: Level of anxiety will decrease Outcome: Adequate for Discharge   Problem: Pain Managment: Goal: General experience of comfort will improve and/or be controlled Outcome: Adequate for Discharge   Problem: Safety: Goal: Ability to remain free from injury will improve Outcome: Adequate for Discharge   "

## 2024-12-06 NOTE — Progress Notes (Signed)
 "  Progress Note  Patient Name: Carl [name] Date of Encounter: 12/06/2024 [facility] Cardiologist: Newman JINNY Lawrence, MD   Interval Summary   Reported that he is doing well, denied chest pain or shortness of breath. Is hungry and ready for breakfast Shared he thinks his AF trigger was alcohol  as the following day after a party he started feeling bad and continued for 3 weeks until he was brought to the ED.   Vital Signs Vitals:   12/05/24 2039 12/05/24 2100 12/05/24 2342 12/06/24 0521  BP: 126/89  110/79 123/85  Pulse: 82  69 75  Resp: 18  19 16   Temp: 98.4 F (36.9 C)  98.3 F (36.8 C) 98 F (36.7 C)  TempSrc: Oral  Oral Oral  SpO2:  98%    Weight:    109.3 kg  Height:        Intake/Output Summary (Last 24 hours) at 12/06/2024 0739 Last data filed at 12/06/2024 0500 Gross per 24 hour  Intake 0 ml  Output 2950 ml  Net -2950 ml      12/06/2024    5:21 AM 12/05/2024    5:26 AM 12/04/2024    5:44 AM  Last 3 Weights  Weight (lbs) 241 lb 242 lb 11.6 oz 243 lb 4.8 oz  Weight (kg) 109.317 kg 110.1 kg 110.36 kg      Telemetry/ECG  NSR HR 70 - Personally Reviewed  Physical Exam  GEN: No acute distress.   Cardiac: RRR Respiratory: Breathing comfortably on RA MS: No edema  Assessment & Plan  Carl [name] is a 69 y.o. male with a hx of hypertension, hyperlipidemia, COPD, HFimpEF, and AFL s/p ablation who presented to the ED for tachycardia and fatigue. ECG showed AFL with variable conduction VR 111. Echo showed LVEF 25 -30% with global hypokinesis. RV function moderately reduced. Bi-atrial enlargement.    Atrial Flutter, with episode of RVR Currently in sinus rhythm Chad Vas score 3 Keep K > 4 and Mag > 2   Patient underwent ablation in 2023. Through SDM chronic anticoagulation was not pursued.  Pursued TEE/DCCV yesterday. No LAA thrombus noted, DCCV pursued and was successful.  Educated patient on AF triggers, especially limiting alcohol  intake as it  appears this was the trigger. Patient is agreeable to limit alcohol  intake. Additionally he shared he did not handle metoprolol  well 2/2 fatigue, and would like to limit eliquis  use. Educated patient on the importance of chronic anticoagulation especially post DCCV. Will schedule close follow up with APP then with his primary cardiologist to help facilitate care and continued conversations.    Continue eliquis  5 mg  Reduce and consolidate metoprolol  to succinate 100 mg   Acute on Chronic HFrEF Etiology tachy-mediated. Last EF drop 2022 in the setting of AFL, had EF recovery once rhythm control was achieved.  Received IV diuresis. Net IO Since Admission: -4,749.45 mL [12/06/24 0739] On exam, appears euvolemic   BB as above Start cozaar  25 mg, will need repeat BMET in one week Consider starting SGLT2i/MRA outpatient will have close follow up to facilitate titration    Hypertension BP: 123/85 Medications as above   Hyperlipidemia LDL 44   HDL 40 Continue lipitor 10 mg   Otherwise per primary  Close cardiology follow up established Hospital follow up with APP 2/5 AF clinic appointment for 3/2 Appt with primary cardiologist 4/13   For questions or updates, please contact [facility] Please consult [email or web address] for contact info under  Signed, Leontine LOISE Salen, PA-C   "

## 2024-12-06 NOTE — Progress Notes (Signed)
" ° °  Heart Failure Stewardship Pharmacist Progress Note   PCP: Trudy Elodia PARAS, PA-C PCP-Cardiologist: Newman PARAS Lawrence, MD    HPI:  69 yo M with PMH of HFrEF, NICM, afib, HTN, HLD, COPD, obesity, and PE. 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   Presented to the ED from his PCP office on 1/23 with afib RVR. For the last two weeks, he reported increased fatigue then progressed to shortness of breath and LE edema. Started on diltiazem  drip. ECHO 1/24 with LVEF 25-30% (was 60-65% in 11/2021), global hypokinesis, RV moderately reduced. Diltiazem  stopped with hypotension and low EF.   S/p TEE/DCCV 1/26. Maintaining sinus rhythm. Denies shortness of breath or chest pain. Vocalized that he does not like taking medications. Reviewed changes to GDMT and goals of therapy.  Current HF Medications: Beta Blocker: metoprolol  tartrate 100 mg BID  Prior to admission HF Medications: ACE/ARB/ARNI: valsartan  160 mg daily *stopped taking metoprolol  because it made him feel weird  Pertinent Lab Values: Serum creatinine 0.94, BUN 16, Potassium 4.2, Sodium 140, proBNP 208, Magnesium  1.9, A1c 5.6   Vital Signs: Weight: 241 lbs (admission weight: 243 lbs) Blood pressure: 120/80s  Heart rate: 70s I/O: net -2.7L yesterday; net -4.7L since admission  Medication Assistance / Insurance Benefits Check: Does the patient have prescription insurance?  Yes Type of insurance plan: Medicare / BCBS supplement  Outpatient Pharmacy:  [facility] Is the patient willing to use [facility] TOC pharmacy at discharge? Yes Is the patient willing to transition their outpatient pharmacy to utilize a [facility] outpatient pharmacy?   No    Assessment: 1. Acute on chronic systolic CHF (LVEF 25-30%), due to NICM. NYHA class II symptoms. - Off IV lasix . Strict I/Os  and daily weights. Keep K>4 and Mg>2. - Continue metoprolol  tartrate 100 mg BID. Will need to consolidate to metoprolol  succinate after cardioversion and prior to discharge.  - Recommend starting losartan  25 mg daily and spironolactone  12.5 mg daily - Consider adding Farxiga 10 mg daily   Plan: 1) Medication changes recommended at this time: -Add losartan  25 mg daily -Add spironolactone  12.5 mg daily -Add Farxiga 10 mg daily if patient is agreeable at follow up -Change metoprolol  tartrate 100 mg BID to succinate 100 mg daily (reduced to allow for BP room for other GDMT optimization)  2) Patient assistance: - Farxiga/Jardiance require prior authorization - Eliquis  copay $89  3)  Education  - Patient has been educated on current HF medications and potential additions to HF medication regimen - Patient verbalizes understanding that over the next few months, these medication doses may change and more medications may be added to optimize HF regimen - Patient has been educated on basic disease state pathophysiology and goals of therapy   Duwaine Plant, PharmD, BCPS Heart Failure Stewardship Pharmacist Phone [phone]   "

## 2024-12-06 NOTE — Progress Notes (Signed)
 Heart Failure Navigator Progress Note  Assessed for Heart & Vascular TOC clinic readiness.  Patient does not meet criteria due to patient declined a HF TOC appointment. He had a discussion in the room with Dr. Raford about starting some new Heart Failure medications, he stated he would like to do his own research on the medications and speak with his doctor at [facility] on 12/15/2024 about if he will take them at that time. Navigator did do HF education with patient using the  Living Better with Heart Failure book and sent a copy home with him. He verbalized his understanding of the education and the importance of taking all medications as prescribed. .   Navigator available for reassessment of patient.   Stephane Haddock, BSN, Scientist, Clinical (histocompatibility And Immunogenetics) Only

## 2024-12-06 NOTE — Progress Notes (Signed)
 Mobility Specialist Progress Note;    12/06/24 1042  Mobility  Activity Ambulated with assistance  Level of Assistance Contact guard assist, steadying assist  Assistive Device None  Distance Ambulated (ft) 450 ft  Activity Response Tolerated well  Mobility Referral Yes  Mobility visit 1 Mobility  Mobility Specialist Start Time (ACUTE ONLY) 1042  Mobility Specialist Stop Time (ACUTE ONLY) 1051  Mobility Specialist Time Calculation (min) (ACUTE ONLY) 9 min   Patient received in chair agreeable to participate in mobility. No physical assistance required, CGA for safety. VSS throughout. Pt refused gait belt despite safety education. Pt experienced LOB due to direction change, however ambulated in hallways with steady gait. Returned to room, no c/o throughout. Pt left at chair with call bell and personal belongings in reach. All needs met.   Florine Oak Mobility Specialist Please Neurosurgeon or [employer] [phone]

## 2024-12-06 NOTE — TOC CM/SW Note (Signed)
 Transition of Care [facility]) - Inpatient Brief Assessment   Patient Details  Name: Carl [name] MRN: [id number] Date of Birth: [birth year]  Transition of Care [facility]) CM/SW Contact:    Sudie Erminio Deems, RN Phone Number: 12/06/2024, 11:11 AM   Clinical Narrative: Patient presented for palpitations and fatigue. PTA patient was from home alone. ICM received a consult for new PCP. Patient states he has a PCP at [facility] and he can schedule a follow up appointment. Patient states he has transportation home. No further needs identified at this time.    Transition of Care Asessment: Insurance and Status: Insurance coverage has been reviewed Patient has primary care physician: Yes (Provider at [facility]) Home environment has been reviewed: reviewed Prior level of function:: independent Prior/Current Home Services: No current home services Social Drivers of Health Review: SDOH reviewed no interventions necessary Readmission risk has been reviewed: Yes Transition of care needs: no transition of care needs at this time

## 2024-12-14 NOTE — Progress Notes (Unsigned)
 " Cardiology Office Note:    Date:  12/15/2024   ID:  Carl [name], DOB 1956-04-18, MRN [id number]  PCP:  Trudy Elodia JINNY DEVONNA   [facility] Providers Cardiologist:  Newman JINNY Lawrence, MD Electrophysiologist:  OLE ONEIDA HOLTS, MD (Inactive)     Referring MD: Trudy Elodia JINNY DEVONNA   Chief complaint: Hospital follow-up     History of Present Illness:   Carl [name] is a 69 y.o. male with a hx of  HTN, HLD, tobacco abuse, atrial flutter/fib, CHF, pulmonary embolus, who presents for follow up.  He was hospitalized in October 2021, November 2021, January 2023 all for atrial flutter with rapid ventricular rates.  He underwent ablation by Dr. Holts in 04/2022.   Presented to the ED on 12/02/2024 with reports of dyspnea, fatigue, generalized weakness, palpitation, weight gain over the last 2 weeks.  Patient was found to be in A-fib with RVR, rates 150-200 bpm.  Placed on diltiazem  infusion but developed hypotension, was given digoxin  and metoprolol .  Echo 12/03/2024: LVEF 25-30%, global hypokinesis, LV mildly dilated, indeterminate diastolic parameters, RV SF moderately reduced, bilateral atria moderately dilated, no significant valvular disease.  Successful DCCV on 11/2024.  He was placed on IV Lasix  for diuresis,-4.7 L with symptom improvement.  Patient was discharged on Toprol -XL 100 mg daily, losartan  25 mg daily, atorvastatin  10 mg daily, Eliquis  5 mg twice daily.  Presents independently, he appears stable from a cardiovascular standpoint.  He reports his heart rates on his home pulse oximeter have been registering 70s-80s daily.  States he did shovel snow this past weekend and felt pretty tired after that, was able to recover.  He also felt he has had some chest congestion since then, which he has been taking Mucinex  DM for.  He has been sleeping in a recliner for comfort and ease of breathing with chest congestion.  He does report compliance with home medication  regimen, denies missing doses of blood thinner.  Denies chest pain, shortness of breath, palpitations, orthopnea, PND, dizziness, lightheadedness, near syncope, dark/tarry/bloody stools, hematuria.  States his lower extremity edema is managed with the use of compression stockings, which he is wearing today.  Reports he has lost 9 pounds since he stopped drinking alcohol  over the last month.  ROS:   Please see the history of present illness.    All other systems reviewed and are negative.     Past Medical History:  Diagnosis Date   Hypercholesteremia    Primary hypertension    Pulmonary embolism (HCC) 08/27/2020   Typical atrial flutter (HCC) 08/27/2020 with PE    Past Surgical History:  Procedure Laterality Date   A-FLUTTER ABLATION N/A 04/21/2022   Procedure: A-FLUTTER ABLATION;  Surgeon: Holts Ole ONEIDA, MD;  Location: [facility] INVASIVE CV LAB;  Service: Cardiovascular;  Laterality: N/A;   CARDIOVERSION N/A 12/05/2024   Procedure: CARDIOVERSION;  Surgeon: Ren Donley Joelle VEAR, MD;  Location: [facility] INVASIVE CV LAB;  Service: Cardiovascular;  Laterality: N/A;   IR THORACENTESIS RIGHT ASP PLEURAL SPACE W/IMG GUIDE  09/20/2020   LAPAROTOMY     LEFT HEART CATH AND CORONARY ANGIOGRAPHY N/A 09/25/2020   Procedure: LEFT HEART CATH AND CORONARY ANGIOGRAPHY;  Surgeon: Lawrence Newman JINNY, MD;  Location: [facility] INVASIVE CV LAB;  Service: Cardiovascular;  Laterality: N/A;   TRANSESOPHAGEAL ECHOCARDIOGRAM (CATH LAB) N/A 12/05/2024   Procedure: TRANSESOPHAGEAL ECHOCARDIOGRAM;  Surgeon: Ren Donley Joelle VEAR, MD;  Location: [facility] INVASIVE CV LAB;  Service: Cardiovascular;  Laterality: N/A;  Current Medications: Active Medications[1]   Allergies:   Patient has no known allergies.   Social History   Socioeconomic History   Marital status: Widowed    Spouse name: Not on file   Number of children: 2   Years of education: Not on file   Highest education level: Not on file  Occupational History    Occupation: retired  Tobacco Use   Smoking status: Some Days    Types: Cigars   Smokeless tobacco: Never   Tobacco comments:    Once a week 05/08/22  Vaping Use   Vaping status: Never Used  Substance and Sexual Activity   Alcohol  use: Yes    Alcohol /week: 6.0 - 8.0 standard drinks of alcohol     Types: 6 - 8 Glasses of wine per week    Comment: 3-4 glasses of wine twice a week 05/08/22   Drug use: Yes    Types: Marijuana    Comment: 1 edible nightly 05/08/22   Sexual activity: Not on file  Other Topics Concern   Not on file  Social History Narrative   Not on file   Social Drivers of Health   Tobacco Use: High Risk (12/15/2024)   Patient History    Smoking Tobacco Use: Some Days    Smokeless Tobacco Use: Never    Passive Exposure: Not on file  Financial Resource Strain: Low Risk (12/06/2024)   Overall Financial Resource Strain (CARDIA)    Difficulty of Paying Living Expenses: Not very hard  Food Insecurity: No Food Insecurity (12/02/2024)   Epic    Worried About Radiation Protection Practitioner of Food in the Last Year: Never true    Ran Out of Food in the Last Year: Never true  Transportation Needs: No Transportation Needs (12/02/2024)   Epic    Lack of Transportation (Medical): No    Lack of Transportation (Non-Medical): No  Physical Activity: Not on file  Stress: Not on file  Social Connections: Moderately Integrated (12/02/2024)   Social Connection and Isolation Panel    Frequency of Communication with Friends and Family: Three times a week    Frequency of Social Gatherings with Friends and Family: Three times a week    Attends Religious Services: 1 to 4 times per year    Active Member of Clubs or Organizations: Yes    Attends Banker Meetings: 1 to 4 times per year    Marital Status: Divorced  Depression (PHQ2-9): Not on file  Alcohol  Screen: Low Risk (12/06/2024)   Alcohol  Screen    Last Alcohol  Screening Score (AUDIT): 2  Housing: Low Risk (12/02/2024)   Epic    Unable  to Pay for Housing in the Last Year: No    Number of Times Moved in the Last Year: 0    Homeless in the Last Year: No  Utilities: Not At Risk (12/02/2024)   Epic    Threatened with loss of utilities: No  Health Literacy: Not on file     Family History: The patient's family history includes CAD (age of onset: 3) in his mother; Cancer in his sister; Heart attack in his brother and father; Heart disease in his brother; Heart failure in his brother.  EKGs/Labs/Other Studies Reviewed:    The following studies were reviewed today:  EKG Interpretation Date/Time:  Thursday December 15 2024 14:33:52 EST Ventricular Rate:  143 PR Interval:    QRS Duration:  84 QT Interval:  302 QTC Calculation: 466 R Axis:   -57  Text Interpretation:  A. Flutter Left anterior fascicular block Nonspecific T wave abnormality When compared with ECG of 05-Dec-2024 13:12, Current undetermined rhythm precludes rhythm comparison, needs review Confirmed by Elaine Moloney [phone]) on 12/15/2024 2:41:09 PM    Recent Labs: 12/02/2024: ALT 25; TSH 1.560 12/03/2024: Pro Brain Natriuretic Peptide 208.0 12/04/2024: Hemoglobin 14.2; Platelets 176 12/06/2024: BUN 16; Creatinine, Ser 0.94; Magnesium  1.9; Potassium 4.2; Sodium 140  Recent Lipid Panel    Component Value Date/Time   CHOL 95 12/03/2024 0432   TRIG 58 12/03/2024 0432   HDL 40 (L) 12/03/2024 0432   CHOLHDL 2.4 12/03/2024 0432   VLDL 12 12/03/2024 0432   LDLCALC 44 12/03/2024 0432     Risk Assessment/Calculations:    CHA2DS2-VASc Score = 3   This indicates a 3.2% annual risk of stroke. The patient's score is based upon: CHF History: 1 HTN History: 1 Diabetes History: 0 Stroke History: 0 Vascular Disease History: 0 Age Score: 1 Gender Score: 0               Physical Exam:    VS:  BP 120/72 (BP Location: Left Arm, Patient Position: Sitting, Cuff Size: Large)   Ht 5' 10 (1.778 m)   Wt 239 lb 9.6 oz (108.7 kg)   SpO2 97%   BMI 34.38 kg/m         Wt Readings from Last 3 Encounters:  12/15/24 239 lb 9.6 oz (108.7 kg)  12/06/24 241 lb (109.3 kg)  08/12/23 240 lb (108.9 kg)     GEN:  Well nourished, well developed in no acute distress HEENT: Normal NECK:  No carotid bruits CARDIAC:  S1-S2 tachycardic, irregularly irregular, no murmurs, rubs, gallops RESPIRATORY:  Decreased in the bases, faint bilateral scattered rales in bases, no wheezing or rhonchi. MUSCULOSKELETAL:  1+ bilateral LE pitting edema; No deformity  SKIN: Warm and dry NEUROLOGIC:  Alert and oriented x 3 PSYCHIATRIC:  Normal affect       Assessment & Plan Paroxysmal atrial fibrillation (HCC) EKG: Atrial flutter, 143 bpm,, nonspecific T wave abnormality Denies chest pain, SOB, orthopnea, palpitations, is cardiac unaware Notified DOD, Dr. Wonda, who came to evaluate the patient to determine whether he would need readmission. Given patient's stable appearance, relatively asymptomatic aside from occasional cough, does not appear to be in acute distress, Dr. Wonda and I believe the patient is appropriate for outpatient management with strict ED precautions.  Will have the patient take an additional 50 mg of Toprol -XL in the evening, and start the patient on digoxin  0.25 mg daily for additional rate and rhythm control. Will draw a BMP, CBC, BNP today Will refer patient to the A-fib clinic with first available appointment  Will refer patient to EP for consideration of repeat ablation in the future Start metoprolol  succinate 100 mg in the a.m. and 50 mg in p.m. Start digoxin  0.25 mg daily Continue Eliquis  5 mg twice daily Chronic systolic congestive heart failure (HCC) Echo 12/03/2024: LVEF 25-30%, severely decreased LV function, global hypokinesis, internal cavity mildly dilated, diastolic parameters indeterminate.  RV SF moderately reduced, bilateral atrium moderately dilated.  No significant valvular disease. Denies shortness of breath, PND, weight gain He does  sleep in a recliner, mildly decreased bilateral lower lobe lung sounds, faint Rales, O2 sat 97% on RA, +1 bilateral pitting edema present Can consider SGLT2i or spironolactone  following start of digoxin  to ensure his BP can tolerate Continue metoprolol  as above, losartan  25 mg daily Primary hypertension Reports his BPs are well-controlled at home Continue  metoprolol  losartan  as above Hyperlipidemia, unspecified hyperlipidemia type Lipid panel 12/03/2024: LDL 44 Continue atorvastatin  10 mg daily  Disposition: Follow up w/ me in 2 weeks, keep scheduled appointment with primary cardiologist in 2 months, get first available appointment with A-fib clinic, follow-up with the EP for consideration of ablation.  Proceed to the ED with any new or worsening symptoms.            Medication Adjustments/Labs and Tests Ordered: Current medicines are reviewed at length with the patient today.  Concerns regarding medicines are outlined above.  Orders Placed This Encounter  Procedures   Brain natriuretic peptide   CBC   EKG 12-Lead   Meds ordered this encounter  Medications   metoprolol  succinate (TOPROL -XL) 50 MG 24 hr tablet    Sig: Take one tablet by mouth daily in the evening    Dispense:  30 tablet    Refill:  3   digoxin  (LANOXIN ) 0.25 MG tablet    Sig: Take 1 tablet (0.25 mg total) by mouth daily.    Dispense:  30 tablet    Refill:  1    Patient Instructions  Go to the Emergency Room (ER) or call 911 immediately for life-threatening conditions: severe chest pain, difficulty breathing, stroke symptoms (sudden weakness, numbness, speech issues), uncontrolled bleeding, loss of consciousness, severe head/neck injuries, or major burns. Other reasons include sudden, severe pain, deep wounds, or poisoning.   Key Symptoms Requiring Immediate ER Care Heart/Lung Issues: Severe chest pain or pressure, shortness of breath, or palpitations.  Stroke Symptoms: Sudden numbness/weakness (especially on  one side), confusion, trouble speaking or seeing, or loss of balance.   Medication Instructions:  START Metoprolol  50mg . Take one tablet daily in the evening. Continue to take the Metoprolol  100mg  in the morning.   ALSO , start Digoxin  0.25mg . Take one tablet by mouth daily. *If you need a refill on your cardiac medications before your next appointment, please call your pharmacy*  Lab Work: BNP, CBC If you have labs (blood work) drawn today and your tests are completely normal, you will receive your results only by: MyChart Message (if you have MyChart) OR A paper copy in the mail If you have any lab test that is abnormal or we need to change your treatment, we will call you to review the results.  Testing/Procedures: No procedures were ordered during today's visit.   Follow-Up: At [facility], you and your health needs are our priority.  As part of our continuing mission to provide you with exceptional heart care, our providers are all part of one team.  This team includes your primary Cardiologist (physician) and Advanced Practice Providers or APPs (Physician Assistants and Nurse Practitioners) who all work together to provide you with the care you need, when you need it.  Your next appointment: URGENT APPOINTMENT NEEDED AT A-FIB CLINIC   We recommend signing up for the patient portal called MyChart.  Sign up information is provided on this After Visit Summary.  MyChart is used to connect with patients for Virtual Visits (Telemedicine).  Patients are able to view lab/test results, encounter notes, upcoming appointments, etc.  Non-urgent messages can be sent to your provider as well.   To learn more about what you can do with MyChart, go to [email or web address].   Other Instructions           Signed, Miriam FORBES Shams, NP  12/15/2024 3:20 PM    [facility]     [1]  Current  Meds  Medication Sig   albuterol  (VENTOLIN  HFA) 108 (90 Base) MCG/ACT inhaler  Inhale 1-2 puffs into the lungs every 6 (six) hours as needed for wheezing or shortness of breath.   allopurinol  (ZYLOPRIM ) 100 MG tablet Take 100 mg by mouth in the morning.    apixaban  (ELIQUIS ) 5 MG TABS tablet Take 1 tablet (5 mg total) by mouth 2 (two) times daily.   atorvastatin  (LIPITOR) 10 MG tablet Take 10 mg by mouth in the morning.   digoxin  (LANOXIN ) 0.25 MG tablet Take 1 tablet (0.25 mg total) by mouth daily.   Lactobacillus Rhamnosus, GG, (RA PROBIOTIC DIGESTIVE CARE) CAPS Take 1 capsule by mouth in the morning.   losartan  (COZAAR ) 25 MG tablet Take 1 tablet (25 mg total) by mouth daily.   MAGNESIUM  PO Take by mouth.   metoprolol  succinate (TOPROL -XL) 100 MG 24 hr tablet Take 1 tablet (100 mg total) by mouth daily. Take with or immediately following a meal.   metoprolol  succinate (TOPROL -XL) 50 MG 24 hr tablet Take one tablet by mouth daily in the evening   Multiple Vitamin (MULTIVITAMIN WITH MINERALS) TABS tablet Take 1 tablet by mouth daily. Centrum Silver for Men 50+   tadalafil (CIALIS) 20 MG tablet Take 20 mg by mouth daily as needed for erectile dysfunction.    "

## 2024-12-15 ENCOUNTER — Other Ambulatory Visit (HOSPITAL_COMMUNITY): Payer: Self-pay

## 2024-12-15 ENCOUNTER — Ambulatory Visit: Admitting: Emergency Medicine

## 2024-12-15 ENCOUNTER — Encounter: Payer: Self-pay | Admitting: Emergency Medicine

## 2024-12-15 VITALS — BP 120/72 | Ht 70.0 in | Wt 239.6 lb

## 2024-12-15 DIAGNOSIS — E785 Hyperlipidemia, unspecified: Secondary | ICD-10-CM | POA: Diagnosis not present

## 2024-12-15 DIAGNOSIS — I10 Essential (primary) hypertension: Secondary | ICD-10-CM

## 2024-12-15 DIAGNOSIS — I5022 Chronic systolic (congestive) heart failure: Secondary | ICD-10-CM

## 2024-12-15 DIAGNOSIS — I480 Paroxysmal atrial fibrillation: Secondary | ICD-10-CM

## 2024-12-15 MED ORDER — DIGOXIN 250 MCG PO TABS: 0.2500 mg | ORAL_TABLET | Freq: Every day | ORAL | 1 refills | Status: DC

## 2024-12-15 MED ORDER — DIGOXIN 250 MCG PO TABS
0.2500 mg | ORAL_TABLET | Freq: Every day | ORAL | 1 refills | Status: AC
  Filled 2024-12-15: qty 30, 30d supply, fill #0

## 2024-12-15 MED ORDER — METOPROLOL SUCCINATE ER 50 MG PO TB24: ORAL_TABLET | ORAL | 3 refills | Status: DC

## 2024-12-15 MED ORDER — METOPROLOL SUCCINATE ER 50 MG PO TB24
50.0000 mg | ORAL_TABLET | Freq: Every evening | ORAL | 3 refills | Status: AC
  Filled 2024-12-15: qty 30, 30d supply, fill #0

## 2024-12-15 MED ORDER — METOPROLOL SUCCINATE ER 100 MG PO TB24: 100.0000 mg | ORAL_TABLET | Freq: Every day | ORAL | 3 refills | Status: DC

## 2024-12-15 MED ORDER — METOPROLOL SUCCINATE ER 100 MG PO TB24
100.0000 mg | ORAL_TABLET | Freq: Every day | ORAL | 3 refills | Status: AC
  Filled 2024-12-15: qty 30, 30d supply, fill #0

## 2024-12-15 NOTE — Assessment & Plan Note (Signed)
 EKG: Atrial flutter, 143 bpm,, nonspecific T wave abnormality Denies chest pain, SOB, orthopnea, palpitations, is cardiac unaware Notified DOD, Dr. Wonda, who came to evaluate the patient to determine whether he would need readmission. Given patient's stable appearance, relatively asymptomatic aside from occasional cough, does not appear to be in acute distress, Dr. Wonda and I believe the patient is appropriate for outpatient management with strict ED precautions.  Will have the patient take an additional 50 mg of Toprol -XL in the evening, and start the patient on digoxin  0.25 mg daily for additional rate and rhythm control. Will draw a BMP, CBC, BNP today Will refer patient to the A-fib clinic with first available appointment  Will refer patient to EP for consideration of repeat ablation in the future Start metoprolol  succinate 100 mg in the a.m. and 50 mg in p.m. Start digoxin  0.25 mg daily Continue Eliquis  5 mg twice daily

## 2024-12-15 NOTE — Patient Instructions (Addendum)
 Go to the Emergency Room (ER) or call 911 immediately for life-threatening conditions: severe chest pain, difficulty breathing, stroke symptoms (sudden weakness, numbness, speech issues), uncontrolled bleeding, loss of consciousness, severe head/neck injuries, or major burns. Other reasons include sudden, severe pain, deep wounds, or poisoning.   Key Symptoms Requiring Immediate ER Care Heart/Lung Issues: Severe chest pain or pressure, shortness of breath, or palpitations.  Stroke Symptoms: Sudden numbness/weakness (especially on one side), confusion, trouble speaking or seeing, or loss of balance.   Medication Instructions:  START Metoprolol  50mg . Take one tablet daily in the evening. Continue to take the Metoprolol  100mg  in the morning.   ALSO , start Digoxin  0.25mg . Take one tablet by mouth daily. *If you need a refill on your cardiac medications before your next appointment, please call your pharmacy*  Lab Work: BNP, CBC If you have labs (blood work) drawn today and your tests are completely normal, you will receive your results only by: MyChart Message (if you have MyChart) OR A paper copy in the mail If you have any lab test that is abnormal or we need to change your treatment, we will call you to review the results.  Testing/Procedures: No procedures were ordered during today's visit.   Follow-Up: At [facility], you and your health needs are our priority.  As part of our continuing mission to provide you with exceptional heart care, our providers are all part of one team.  This team includes your primary Cardiologist (physician) and Advanced Practice Providers or APPs (Physician Assistants and Nurse Practitioners) who all work together to provide you with the care you need, when you need it.  Your next appointment: URGENT APPOINTMENT NEEDED AT [facility]   We recommend signing up for the patient portal called MyChart.  Sign up information is provided on this After  Visit Summary.  MyChart is used to connect with patients for Virtual Visits (Telemedicine).  Patients are able to view lab/test results, encounter notes, upcoming appointments, etc.  Non-urgent messages can be sent to your provider as well.   To learn more about what you can do with MyChart, go to [email or web address].   Other Instructions

## 2024-12-16 ENCOUNTER — Ambulatory Visit (HOSPITAL_COMMUNITY): Admission: RE | Admit: 2024-12-16 | Admitting: Physician Assistant

## 2024-12-16 VITALS — BP 102/76 | HR 139 | Ht 70.0 in | Wt 236.0 lb

## 2024-12-16 DIAGNOSIS — D6869 Other thrombophilia: Secondary | ICD-10-CM

## 2024-12-16 DIAGNOSIS — I4819 Other persistent atrial fibrillation: Secondary | ICD-10-CM

## 2024-12-16 DIAGNOSIS — I4891 Unspecified atrial fibrillation: Secondary | ICD-10-CM

## 2024-12-16 DIAGNOSIS — I483 Typical atrial flutter: Secondary | ICD-10-CM

## 2024-12-16 LAB — CBC
Hematocrit: 49.8 % (ref 37.5–51.0)
Hemoglobin: 16.4 g/dL (ref 13.0–17.7)
MCH: 33.6 pg — ABNORMAL HIGH (ref 26.6–33.0)
MCHC: 32.9 g/dL (ref 31.5–35.7)
MCV: 102 fL — ABNORMAL HIGH (ref 79–97)
Platelets: 221 10*3/uL (ref 150–450)
RBC: 4.88 x10E6/uL (ref 4.14–5.80)
RDW: 12.2 % (ref 11.6–15.4)
WBC: 5 10*3/uL (ref 3.4–10.8)

## 2024-12-16 LAB — BRAIN NATRIURETIC PEPTIDE: BNP: 280.2 pg/mL — AB (ref 0.0–100.0)

## 2024-12-16 LAB — BASIC METABOLIC PANEL WITH GFR
BUN/Creatinine Ratio: 14 (ref 10–24)
BUN: 13 mg/dL (ref 8–27)
CO2: 23 mmol/L (ref 20–29)
Calcium: 9.6 mg/dL (ref 8.6–10.2)
Chloride: 105 mmol/L (ref 96–106)
Creatinine, Ser: 0.9 mg/dL (ref 0.76–1.27)
Glucose: 81 mg/dL (ref 70–99)
Potassium: 4.8 mmol/L (ref 3.5–5.2)
Sodium: 141 mmol/L (ref 134–144)
eGFR: 93 mL/min/{1.73_m2}

## 2024-12-16 MED ORDER — RIVAROXABAN 20 MG PO TABS: 20.0000 mg | ORAL_TABLET | Freq: Every day | ORAL | 6 refills | Status: AC

## 2024-12-16 NOTE — Progress Notes (Signed)
 "   Primary Care Physician: Trudy Elodia PARAS, PA-C Primary Cardiologist: Dr Elmira Primary Electrophysiologist: Dr Cindie previously  Referring Physician: Dr Cindie Carlin Carl [name] is a 69 y.o. male with a history of HTN, HLD, tobacco abuse, atrial flutter, CHF who presents for follow up in the [facility]. He was hospitalized in October 2021, November 2021, January 2023 all for atrial flutter with rapid ventricular rates. He underwent typical atrial flutter ablation with Dr Cindie on 04/21/22.   Patient returns for follow up for atrial fibrillation and atrial flutter. Discussed the use of AI scribe software for clinical note transcription with the patient, who gave verbal consent to proceed.  He presented to the emergency room on December 02, 2024, with extreme fatigue, SOB, and weakness. An ECG showed atrial fibrillation with a rapid ventricular rate, and an echocardiogram revealed a decreased ejection fraction of 25-30%. He underwent a transesophageal echocardiogram and guided cardioversion on December 05, 2024.  On December 15, 2024, he was seen in the heartcare office, and his medications were adjusted at that time. His metoprolol  dosage was increased, and he was started on digoxin . He notes that his heart was 'back out of rhythm' recently, although he cannot always tell when this occurs. He describes feeling 'blah' but not to the extent that it prevents him from engaging in activities. His symptoms began over a month ago after a night of heavy drinking at a party, which he believes triggered his arrhythmia. He does admit to missing a dose of Eliquis  yesterday. He typically takes his medications at noon and midnight.      Today, he  denies symptoms of palpitations, chest pain, orthopnea, PND, lower extremity edema, dizziness, presyncope, syncope, snoring, daytime somnolence, bleeding, or neurologic sequela. The patient is tolerating medications without  difficulties and is otherwise without complaint today.    Atrial Fibrillation Risk Factors:  he does not have symptoms or diagnosis of sleep apnea. he does not have a history of rheumatic fever. he does have a history of alcohol  use.  Atrial Fibrillation Management history:  Previous antiarrhythmic drugs: amiodarone  Previous cardioversions: 2021 Previous ablations: 04/21/22 Anticoagulation history: Eliquis    Past Medical History:  Diagnosis Date   Hypercholesteremia    Primary hypertension    Pulmonary embolism (HCC) 08/27/2020   Typical atrial flutter (HCC) 08/27/2020 with PE    Current Outpatient Medications  Medication Sig Dispense Refill   albuterol  (VENTOLIN  HFA) 108 (90 Base) MCG/ACT inhaler Inhale 1-2 puffs into the lungs every 6 (six) hours as needed for wheezing or shortness of breath. 1 each 0   allopurinol  (ZYLOPRIM ) 100 MG tablet Take 100 mg by mouth in the morning.      apixaban  (ELIQUIS ) 5 MG TABS tablet Take 1 tablet (5 mg total) by mouth 2 (two) times daily. 60 tablet 0   atorvastatin  (LIPITOR) 10 MG tablet Take 10 mg by mouth in the morning.     digoxin  (LANOXIN ) 0.25 MG tablet Take 1 tablet (0.25 mg total) by mouth daily. 30 tablet 1   Lactobacillus Rhamnosus, GG, (RA PROBIOTIC DIGESTIVE CARE) CAPS Take 1 capsule by mouth in the morning.     losartan  (COZAAR ) 25 MG tablet Take 1 tablet (25 mg total) by mouth daily. 30 tablet 0   MAGNESIUM  PO Take by mouth. (Patient taking differently: Take 1 tablet by mouth 2 (two) times a week.)     metoprolol  succinate (TOPROL -XL) 100 MG 24 hr tablet Take 1 tablet (100 mg total)  by mouth daily. Take with or immediately following a meal. 30 tablet 3   metoprolol  succinate (TOPROL -XL) 50 MG 24 hr tablet Take 1 tablet (50 mg total) by mouth every evening. 30 tablet 3   Multiple Vitamin (MULTIVITAMIN WITH MINERALS) TABS tablet Take 1 tablet by mouth daily. Centrum Silver for Men 50+     tadalafil (CIALIS) 20 MG tablet Take 20 mg  by mouth daily as needed for erectile dysfunction.      No current facility-administered medications for this encounter.    ROS- All systems are reviewed and negative except as per the HPI above.  Physical Exam: Vitals:   12/16/24 0849  BP: 102/76  Pulse: (!) 139  Weight: 107 kg  Height: 5' 10 (1.778 m)    GEN: Well nourished, well developed in no acute distress NECK: No JVD CARDIAC: Irregularly irregular rate and rhythm, no murmurs, rubs, gallops RESPIRATORY:  Clear to auscultation without rales, wheezing or rhonchi  ABDOMEN: Soft, non-tender, non-distended EXTREMITIES:  No edema; No deformity    Wt Readings from Last 3 Encounters:  12/16/24 107 kg  12/15/24 108.7 kg  12/06/24 109.3 kg    EKG Interpretation Date/Time:  Friday December 16 2024 09:22:15 EST Ventricular Rate:  139 PR Interval:    QRS Duration:  84 QT Interval:  318 QTC Calculation: 483 R Axis:   -63  Text Interpretation: Atrial fibrillation with rapid ventricular response Left anterior fascicular block Abnormal ECG When compared with ECG of 15-Dec-2024 14:33, No significant change was found Confirmed by Gerlene Glassburn ([id number]) on 12/16/2024 9:34:16 AM   Echo 12/03/24 demonstrated   1. Left ventricular ejection fraction, by estimation, is 25 to 30%. The  left ventricle has severely decreased function. The left ventricle  demonstrates global hypokinesis. The left ventricular internal cavity size  was mildly dilated. Left ventricular diastolic function could not be evaluated.   2. Right ventricular systolic function is moderately reduced. The right  ventricular size is normal. There is normal pulmonary artery systolic  pressure. The estimated right ventricular systolic pressure is 25.7 mmHg.   3. Left atrial size was moderately dilated.   4. Right atrial size was moderately dilated.   5. The mitral valve is normal in structure. No evidence of mitral valve  regurgitation.   6. The aortic valve is tricuspid.  Aortic valve regurgitation is not  visualized. No aortic stenosis is present.   Comparison(s): Prior images reviewed side by side. The left ventricular  function is significantly worse.   Epic records are reviewed at length today   CHA2DS2-VASc Score = 3  The patient's score is based upon: CHF History: 1 HTN History: 1 Diabetes History: 0 Stroke History: 0 Vascular Disease History: 0 Age Score: 1 Gender Score: 0       ASSESSMENT AND PLAN: Persistent Atrial Fibrillation/atrial flutter (ICD10:  I48.19) The patient's CHA2DS2-VASc score is 3, indicating a 3.2% annual risk of stroke.   S/p flutter ablation 04/21/22 ECG on 12/02/24 appears consistent with coarse afib vs atypical flutter.  He is in afib today with rapid rates. We discussed rhythm control options today. Unfortunately, with a missed dose of Eliquis  we are limited. Long term, he is interested in repeat ablation. He is already scheduled for a consultation with Dr Nancey for 3/25, will see if this can be scheduled sooner.  Change Eliquis  to Xarelto  20 mg daily to help with medication adherence.  Will have him follow up next week. If he remains rapid despite increase BB  and addition of digoxin , will arrange for repeat TEE/DCCV and add amiodarone  as a bridge to ablation. Will not start amiodarone  today to avoid premature chemical conversion with missed dose of Eliquis .  Continue Toprol  100 mg AM and 50 mg PM Continue digoxin  0.25 mg daily  Secondary Hypercoagulable State (ICD10:  D68.69) The patient is at significant risk for stroke/thromboembolism based upon his CHA2DS2-VASc Score of 3.  Continue Apixaban  (Eliquis ). No bleeding issues.   Obesity Body mass index is 33.86 kg/m.  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 Has h/o tachycardia mediated CM. GDMT per primary cardiology team Fluid status appears stable today  HTN Stable on current regimen   Follow up in the  AF clinic next week.    Daril Kicks PA-C [facility] [address] [place], [place] [address] [phone] 12/16/2024 9:34 AM  "

## 2024-12-16 NOTE — Patient Instructions (Addendum)
 Stop Eliquis    Start Xarelto  20 mg once daily with supper

## 2024-12-23 ENCOUNTER — Ambulatory Visit (HOSPITAL_COMMUNITY): Admitting: Physician Assistant

## 2024-12-28 ENCOUNTER — Ambulatory Visit: Admitting: Emergency Medicine

## 2025-01-09 ENCOUNTER — Ambulatory Visit (HOSPITAL_COMMUNITY): Admitting: Physician Assistant

## 2025-02-01 ENCOUNTER — Ambulatory Visit: Admitting: Cardiovascular Disease

## 2025-02-20 ENCOUNTER — Ambulatory Visit: Admitting: Cardiology
